# Patient Record
Sex: Male | Born: 1970
Health system: Southern US, Community
[De-identification: ages and names within clinical notes are randomized; demographics above are authoritative.]

## PROBLEM LIST (undated history)

## (undated) DIAGNOSIS — E785 Hyperlipidemia, unspecified: Secondary | ICD-10-CM

## (undated) DIAGNOSIS — I10 Essential (primary) hypertension: Secondary | ICD-10-CM

## (undated) HISTORY — DX: Essential (primary) hypertension: I10

## (undated) HISTORY — PX: HERNIA REPAIR: SHX51

## (undated) HISTORY — DX: Hyperlipidemia, unspecified: E78.5

---

## 2002-12-18 ENCOUNTER — Emergency Department (HOSPITAL_COMMUNITY): Admission: EM | Admit: 2002-12-18 | Discharge: 2002-12-18 | Payer: Self-pay | Admitting: Emergency Medicine

## 2005-03-18 ENCOUNTER — Encounter: Payer: Self-pay | Admitting: Cardiovascular Disease

## 2005-03-21 ENCOUNTER — Encounter: Payer: Self-pay | Admitting: Cardiovascular Disease

## 2010-08-26 ENCOUNTER — Emergency Department: Payer: BC Managed Care – PPO | Admitting: Emergency Medicine

## 2011-08-23 ENCOUNTER — Ambulatory Visit (INDEPENDENT_AMBULATORY_CARE_PROVIDER_SITE_OTHER): Payer: BC Managed Care – PPO | Admitting: Cardiovascular Disease

## 2011-08-23 ENCOUNTER — Encounter: Payer: Self-pay | Admitting: Cardiovascular Disease

## 2011-08-23 ENCOUNTER — Telehealth: Payer: Self-pay | Admitting: Cardiovascular Disease

## 2011-08-23 VITALS — BP 131/88 | HR 83 | Ht 67.0 in | Wt 147.0 lb

## 2011-08-23 DIAGNOSIS — R002 Palpitations: Secondary | ICD-10-CM | POA: Insufficient documentation

## 2011-08-23 NOTE — Telephone Encounter (Signed)
New Problem   Patient calling to verify med records sent from his PCP for his appnt today with Dr. Rose Fillers @ 11:30am.  He can be reached at work [phone]

## 2011-08-23 NOTE — Assessment & Plan Note (Signed)
Most likely are related to premature beats worsened with ingestion of stimulants such as nicotine and caffeine. He is advised to stop smoking and limit caffeine intake. No ischemic workup necessary at this time since he has no chest pain and normal stress test March 2012 per Dr. Jacinto Halim. Echo normal March 2012. He will follow up as needed. Smoking cessation encouraged.

## 2011-08-23 NOTE — Patient Instructions (Signed)
Your physician recommends that you schedule a follow-up appointment as needed  

## 2011-08-23 NOTE — Telephone Encounter (Signed)
Spoke with pt and told him we had received records from [facility]

## 2011-08-23 NOTE — Progress Notes (Signed)
   History of Present Illness: 41 yo AAM with history of tobacco abuse, chest pain and palpitations who is here today as a new patient for cardiac evaluation. He has been seen in the past by Dr. Dorothyann Peng in [place], [place]. He had a stress test in October 2006 which was normal. Echocardiogram 2006 with normal LVEF 60%, no significant valvular disease. EKG was normal at that time. He had recurrence of his symptoms over the last 3-4 years with awareness of palpitations. This is described as rapid heart rate with some fullness in chest. This lasts for about one minute. This occurs when driving and at work. Associated with dizziness but no SOB. He saw Dr. Yates Decamp 6 months ago for these symptoms. He had an echo which was normal, treadmill stress test which was normal. He wore a monitor for 48 hours and had no problems noted. 2 months ago he was drinking Red Bull energy drink and felt palpitations. He notices these mostly during stressful times.   He tells me today that he has had no palpitations over the last month. He is worried about the possibility of CAD since his mother had an MI. No chest pain or SOB with exertion. He smokes 5-10 cigarettes per day.    Past Medical History  Diagnosis Date  . Chest pain     Past Surgical History  Procedure Date  . Hernia repair     Current Outpatient Prescriptions  Medication Sig Dispense Refill  . NON FORMULARY NO MEDS AT THIS TIME        Allergies  Allergen Reactions  . Penicillins     History   Social History  . Marital Status: Married    Spouse Name: N/A    Number of Children: 0  . Years of Education: N/A   Occupational History  . IT    Social History Main Topics  . Smoking status: Current Everyday Smoker -- 0.5 packs/day for 17 years    Types: Cigarettes  . Smokeless tobacco: Not on file  . Alcohol Use: No  . Drug Use: No  . Sexually Active: Not on file   Other Topics Concern  . 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 He is a smoker. He denies alcohol consumption    Family History  Problem Relation Age of Onset  . Heart attack Mother     Review of Systems:  As stated in the HPI and otherwise negative.   BP 131/88  Pulse 83  Ht 5\' 7"  (1.702 m)  Wt 147 lb (66.679 kg)  BMI 23.02 kg/m2  Physical Examination: General: Well developed, well nourished, NAD HEENT: OP clear, mucus membranes moist SKIN: warm, dry. No rashes. Neuro: No focal deficits Musculoskeletal: Muscle strength 5/5 all ext Psychiatric: Mood and affect normal Neck: No JVD, no carotid bruits, no thyromegaly, no lymphadenopathy. Lungs:Clear bilaterally, no wheezes, rhonci, crackles Cardiovascular: Regular rate and rhythm. No murmurs, gallops or rubs. Abdomen:Soft. Bowel sounds present. Non-tender.  Extremities: No lower extremity edema. Pulses are 2 + in the bilateral DP/PT.  EKG: 07/20/11: NSR, normal EKG per Dr. Jacinto Halim

## 2011-10-03 ENCOUNTER — Other Ambulatory Visit: Payer: Self-pay | Admitting: Internal Medicine

## 2011-10-05 ENCOUNTER — Other Ambulatory Visit: Payer: Self-pay | Admitting: Physician Assistant

## 2011-10-13 ENCOUNTER — Ambulatory Visit: Payer: Self-pay | Admitting: Family Medicine

## 2011-12-11 ENCOUNTER — Emergency Department: Payer: Self-pay | Admitting: Emergency Medicine

## 2011-12-11 LAB — URINALYSIS, COMPLETE
Bacteria: NONE SEEN
Glucose,UR: NEGATIVE mg/dL (ref 0–75)
Ketone: NEGATIVE
Ph: 6 (ref 4.5–8.0)
Protein: NEGATIVE
Specific Gravity: 1.001 (ref 1.003–1.030)
Squamous Epithelial: NONE SEEN
WBC UR: <1 /HPF (ref 0–5)

## 2011-12-11 LAB — CBC
HGB: 13.9 g/dL (ref 13.0–18.0)
RBC: 4.99 10*6/uL (ref 4.40–5.90)
RDW: 13.3 % (ref 11.5–14.5)
WBC: 12.3 10*3/uL — ABNORMAL HIGH (ref 3.8–10.6)

## 2011-12-11 LAB — TROPONIN I: Troponin-I: <0.02 ng/mL

## 2011-12-11 LAB — COMPREHENSIVE METABOLIC PANEL
Alkaline Phosphatase: 57 U/L (ref 50–136)
BUN: 16 mg/dL (ref 7–18)
Bilirubin,Total: 0.3 mg/dL (ref 0.2–1.0)
Calcium, Total: 8.9 mg/dL (ref 8.5–10.1)
Chloride: 105 mmol/L (ref 98–107)
EGFR (African American): >60
Osmolality: 280 (ref 275–301)
Potassium: 4.2 mmol/L (ref 3.5–5.1)
Sodium: 140 mmol/L (ref 136–145)

## 2011-12-11 LAB — CK TOTAL AND CKMB (NOT AT ARMC): CK, Total: 269 U/L — ABNORMAL HIGH (ref 35–232)

## 2012-01-02 ENCOUNTER — Telehealth: Payer: Self-pay

## 2012-01-02 NOTE — Telephone Encounter (Signed)
Pt was seen approximately 1 year ago and needs urgent information for his insurance on his diagnosis of "Hyperlipedemia".  Explained that we allow 24 hours for return phone calls and that if this is indeed urgent, he may need to come in an be seen by a provider.

## 2012-01-03 NOTE — Telephone Encounter (Signed)
Called Pt and explained what hyperlipidemia is. Advised him to RTC fasting for lipid check. Eileen Stanford

## 2012-01-26 ENCOUNTER — Telehealth: Payer: Self-pay

## 2012-01-26 NOTE — Telephone Encounter (Signed)
Insurance brokerage requesting records on patient for insurance purposes   cb [phone]  Rodney Booze   Fax Number [phone]

## 2012-07-17 ENCOUNTER — Other Ambulatory Visit: Payer: Self-pay | Admitting: Family Medicine

## 2012-07-17 ENCOUNTER — Other Ambulatory Visit: Payer: Self-pay | Admitting: Internal Medicine

## 2014-09-08 ENCOUNTER — Ambulatory Visit: Payer: Self-pay | Admitting: Cardiovascular Disease

## 2015-04-10 ENCOUNTER — Ambulatory Visit (INDEPENDENT_AMBULATORY_CARE_PROVIDER_SITE_OTHER): Payer: Self-pay | Admitting: Family Medicine

## 2015-04-10 ENCOUNTER — Encounter: Payer: Self-pay | Admitting: Family Medicine

## 2015-04-10 VITALS — BP 128/84 | HR 85 | Temp 98.7°F | Ht 64.5 in | Wt 158.0 lb

## 2015-04-10 DIAGNOSIS — N529 Male erectile dysfunction, unspecified: Secondary | ICD-10-CM

## 2015-04-10 DIAGNOSIS — E785 Hyperlipidemia, unspecified: Secondary | ICD-10-CM

## 2015-04-10 DIAGNOSIS — I10 Essential (primary) hypertension: Secondary | ICD-10-CM | POA: Insufficient documentation

## 2015-04-10 LAB — LIPID PANEL PICCOLO, WAIVED
CHOL/HDL RATIO PICCOLO,WAIVE: 3.3 mg/dL
CHOLESTEROL PICCOLO, WAIVED: 206 mg/dL — AB (ref <200)
HDL Chol Piccolo, Waived: 62 mg/dL (ref >59)
LDL CHOL CALC PICCOLO WAIVED: 110 mg/dL — AB (ref <100)
TRIGLYCERIDES PICCOLO,WAIVED: 172 mg/dL — AB (ref <150)
VLDL Chol Calc Piccolo,Waive: 34 mg/dL — ABNORMAL HIGH (ref <30)

## 2015-04-10 MED ORDER — ATORVASTATIN CALCIUM 40 MG PO TABS: 40.0000 mg | ORAL_TABLET | Freq: Every day | ORAL | Status: DC

## 2015-04-10 MED ORDER — AMLODIPINE BESYLATE 10 MG PO TABS: 10.0000 mg | ORAL_TABLET | Freq: Every day | ORAL | Status: DC

## 2015-04-10 MED ORDER — LISINOPRIL 10 MG PO TABS: 10.0000 mg | ORAL_TABLET | Freq: Every day | ORAL | Status: DC

## 2015-04-10 MED ORDER — TADALAFIL 20 MG PO TABS
10.0000 mg | ORAL_TABLET | ORAL | Status: DC | PRN
Start: 2015-04-10 — End: 2015-04-27

## 2015-04-10 NOTE — Patient Instructions (Addendum)
Lisinopril tablets What is this medicine? LISINOPRIL (lyse IN oh pril) is an ACE inhibitor. This medicine is used to treat high blood pressure and heart failure. It is also used to protect the heart immediately after a heart attack. This medicine may be used for other purposes; ask your health care provider or pharmacist if you have questions. What should I tell my health care provider before I take this medicine? They need to know if you have any of these conditions: -diabetes -heart or blood vessel disease -kidney disease -low blood pressure -previous swelling of the tongue, face, or lips with difficulty breathing, difficulty swallowing, hoarseness, or tightening of the throat -an unusual or allergic reaction to lisinopril, other ACE inhibitors, insect venom, foods, dyes, or preservatives -pregnant or trying to get pregnant -breast-feeding How should I use this medicine? Take this medicine by mouth with a glass of water. Follow the directions on your prescription label. You may take this medicine with or without food. If it upsets your stomach, take it with food. Take your medicine at regular intervals. Do not take it more often than directed. Do not stop taking except on your doctor's advice. Talk to your pediatrician regarding the use of this medicine in children. Special care may be needed. While this drug may be prescribed for children as young as 6 years of age for selected conditions, precautions do apply. Overdosage: If you think you have taken too much of this medicine contact a poison control center or emergency room at once. NOTE: This medicine is only for you. Do not share this medicine with others. What if I miss a dose? If you miss a dose, take it as soon as you can. If it is almost time for your next dose, take only that dose. Do not take double or extra doses. What may interact with this medicine? Do not take this medicine with any of the following medications: -hymenoptera  venomThis medicines may also interact with the following medications: -aliskiren -angiotensin receptor blockers, like losartan or valsartan -certain medicines for diabetes -diuretics -everolimus -gold compounds -lithium -NSAIDs, medicines for pain and inflammation, like ibuprofen or naproxen -potassium salts or supplements -salt substitutes -sirolimus -temsirolimus This list may not describe all possible interactions. Give your health care provider a list of all the medicines, herbs, non-prescription drugs, or dietary supplements you use. Also tell them if you smoke, drink alcohol, or use illegal drugs. Some items may interact with your medicine. What should I watch for while using this medicine? Visit your doctor or health care professional for regular check ups. Check your blood pressure as directed. Ask your doctor what your blood pressure should be, and when you should contact him or her. Do not treat yourself for coughs, colds, or pain while you are using this medicine without asking your doctor or health care professional for advice. Some ingredients may increase your blood pressure. Women should inform their doctor if they wish to become pregnant or think they might be pregnant. There is a potential for serious side effects to an unborn child. Talk to your health care professional or pharmacist for more information. Check with your doctor or health care professional if you get an attack of severe diarrhea, nausea and vomiting, or if you sweat a lot. The loss of too much body fluid can make it dangerous for you to take this medicine. You may get drowsy or dizzy. Do not drive, use machinery, or do anything that needs mental alertness until you know how   this drug affects you. Do not stand or sit up quickly, especially if you are an older patient. This reduces the risk of dizzy or fainting spells. Alcohol can make you more drowsy and dizzy. Avoid alcoholic drinks. Avoid salt substitutes unless  you are told otherwise by your doctor or health care professional. What side effects may I notice from receiving this medicine? Side effects that you should report to your doctor or health care professional as soon as possible: -allergic reactions like skin rash, itching or hives, swelling of the hands, feet, face, lips, throat, or tongue -breathing problems -signs and symptoms of kidney injury like trouble passing urine or change in the amount of urine -signs and symptoms of increased potassium like muscle weakness; chest pain; or fast, irregular heartbeat -signs and symptoms of liver injury like dark yellow or brown urine; general ill feeling or flu-like symptoms; light-colored stools; loss of appetite; nausea; right upper belly pain; unusually weak or tired; yellowing of the eyes or skin -signs and symptoms of low blood pressure like dizziness; feeling faint or lightheaded, falls; unusually weak or tired -stomach pain with or without nausea and vomiting Side effects that usually do not require medical attention (report to your doctor or health care professional if they continue or are bothersome): -changes in taste -cough -dizziness -fever -headache -sensitivity to light This list may not describe all possible side effects. Call your doctor for medical advice about side effects. You may report side effects to FDA at 1-800-FDA-1088. Where should I keep my medicine? Keep out of the reach of children. Store at room temperature between 15 and 30 degrees C (59 and 86 degrees F). Protect from moisture. Keep container tightly closed. Throw away any unused medicine after the expiration date. NOTE: This sheet is a summary. It may not cover all possible information. If you have questions about this medicine, talk to your doctor, pharmacist, or health care provider.    2016, Elsevier/Gold Standard. (2015-02-05 20:38:20) Microalbumin Test WHY AM I HAVING THIS TEST? Albumin is a protein in your body  that helps regulate how much water is in your blood. As your kidneys filter your blood to get rid of waste products through your urine, albumin should remain in your bloodstream. However, certain types of kidney disease can cause albumin to move from damaged blood vessels inside your kidneys and into your urine. When this happens, small amounts of albumin (microalbumin, MA) can be detected in your urine. This type of kidney damage is a common complication of diabetes mellitus, especially when blood sugar (glucose) has not been well controlled. The MA test may also help your health care provider diagnose other related medical conditions such as cardiovascular disease and high blood pressure. The MA test is often performed along with calculating urine creatinine levels. Creatinine is another waste product filtered out of your blood by your kidneys. You may have this test if:  You have diabetes and are showing signs of kidney damage.  Your health care provider wants to determine how well your blood glucose has been controlled over many years.  Your health care provider wants to determine your kidney function when other related tests are normal. WHAT KIND OF SAMPLE IS TAKEN? A urine sample is collected in a sterile container given to you by the lab. WHAT ARE THE REFERENCE VALUES? Reference valuesare considered healthy valuesestablished after testing a large group of healthy people. Reference values may vary among different people, labs, and hospitals. It is your responsibility to obtain  your test results. Ask the lab or department performing the test when and how you will get your results. A reference value for MA is any value less than 2 mg/L. A reference value for MA compared to creatinine is:  Men: less than 17 mg/g creatinine.  Women: less than 25 mg/g creatinine. WHAT DO THE RESULTS MEAN? MA test results that are higher than the reference values may indicate many health conditions. These may  include:  Diabetes mellitus.  Poorly controlled diabetes mellitus.  Myoglobulinuria.  Hemoglobinuria.  Bence-Jones proteinuria.  Use of medicines or drugs that are damaging to the kidneys.  Atherosclerosis.  Blood fat (lipid) abnormalities.  Insulin resistance.  High blood pressure.  Heart attack. Talk with your health care provider to discuss your results, treatment options, and if necessary, the need for more tests. Talk with your health care provider if you have any questions about your results.   This information is not intended to replace advice given to you by your health care provider. Make sure you discuss any questions you have with your health care provider.   Document Released: 07/16/2004 Document Revised: 07/04/2014 Document Reviewed: 11/01/2013 Elsevier Interactive Patient Education [employer]2016 Elsevier Inc.

## 2015-04-10 NOTE — Assessment & Plan Note (Signed)
Under good control on current regimen. Continue current regimen. Continue to monitor.  

## 2015-04-10 NOTE — Assessment & Plan Note (Signed)
Well controlled with cialis. Refilled today. Continue to monitor. 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

## 2015-04-10 NOTE — Assessment & Plan Note (Signed)
Well controlled on current regimen. Unable to leave a urine for microalbumin today, but he is concerned about + microalbumin and would like to be switched to lisinopril. Risks and benefits discussed. Will change and recheck in 1 month.

## 2015-04-10 NOTE — Progress Notes (Signed)
BP 128/84 mmHg  Pulse 85  Temp(Src) 98.7 F (37.1 C)  Ht 5' 4.5" (1.638 m)  Wt 158 lb (71.668 kg)  BMI 26.71 kg/m2  SpO2 98%   Subjective:    Patient ID: Shawn Ware, male    DOB: 10/24/1970, 44 y.o.   MRN: [id number]  HPI: Shawn Ware is a 44 y.o. male who p  Chief Complaint  Patient presents with  . Hypertension  . Hyperlipidemia   HYPERTENSION / HYPERLIPIDEMIA Satisfied with current treatment? yes Duration of hypertension: 1 year BP monitoring frequency: a few times a day BP range: 130s-80s BP medication side effects: no Past BP meds: none Duration of hyperlipidemia: 6 months Cholesterol medication side effects: no Cholesterol supplements: none Past cholesterol medications: none Medication compliance: excellent compliance Aspirin: no Recent stressors: yes Recurrent headaches: no Visual changes: no Palpitations: no Dyspnea: no Chest pain: no Lower extremity edema: no Dizzy/lightheaded: yes  ERECTILE DYSFUNCTION ED status: stable on medicine Satisfied with current treatment?: yes Medication side effects: no Problem with getting erections: no Problem with sustaining erections: yes Poor erectile quality: yes Frequency of dysfunction: varying Morning/nocturnal erections: yes Decreased libido: no New medications: none Relationship problems: none Good communication with sexual partner: yes Satisfaction with current partner: yes  Treatments attempted: cialis  Relevant past medical, surgical, family and social history reviewed and updated as indicated. Interim medical history since our last visit reviewed. Allergies and medications reviewed and updated.  Review of Systems  Constitutional: Negative.   Respiratory: Negative.   Cardiovascular: Negative.   Gastrointestinal: Negative.   Psychiatric/Behavioral: Negative.     Per HPI unless specifically indicated above     Objective:    BP 128/84 mmHg  Pulse 85  Temp(Src) 98.7 F (37.1 C)  Ht 5'  4.5" (1.638 m)  Wt 158 lb (71.668 kg)  BMI 26.71 kg/m2  SpO2 98%  Wt Readings from Last 3 Encounters:  04/10/15 158 lb (71.668 kg)  08/23/11 147 lb (66.679 kg)    Physical Exam  Constitutional: He is oriented to person, place, and time. He appears well-developed and well-nourished. No distress.  HENT:  Head: Normocephalic and atraumatic.  Right Ear: Hearing normal.  Left Ear: Hearing normal.  Nose: Nose normal.  Eyes: Conjunctivae and lids are normal. Right eye exhibits no discharge. Left eye exhibits no discharge. No scleral icterus.  Cardiovascular: Normal rate, regular rhythm and intact distal pulses.  Exam reveals no gallop and no friction rub.   No murmur heard. Pulmonary/Chest: Effort normal and breath sounds normal. No respiratory distress. He has no wheezes. He has no rales. He exhibits no tenderness.  Musculoskeletal: Normal range of motion.  Neurological: He is alert and oriented to person, place, and time.  Skin: Skin is warm, dry and intact. No rash noted. No erythema. No pallor.  Psychiatric: He has a normal mood and affect. His speech is normal and behavior is normal. Judgment and thought content normal. Cognition and memory are normal.  Nursing note and vitals reviewed.   Results for orders placed or performed in visit on 12/11/11  Urinalysis, Complete  Result Value Ref Range   Color - urine Colorless    Clarity - urine Clear    Glucose,UR Negative 0-75 mg/dL   Bilirubin,UR Negative NEGATIVE   Ketone Negative NEGATIVE   Specific Gravity 1.001 1.003-1.030   Blood 1+ NEGATIVE   Ph 6.0 4.5-8.0   Protein Negative NEGATIVE   Nitrite Negative NEGATIVE   Leukocyte Esterase Negative NEGATIVE  RBC,UR 1 /HPF 0-5 /HPF   WBC UR <1 /HPF 0-5 /HPF   Bacteria NONE SEEN NONE SEEN   Squamous Epithelial NONE SEEN   CBC  Result Value Ref Range   WBC 12.3 (H) 3.8-10.6 x10 3/mm 3   RBC 4.99 4.40-5.90 x10 6/mm 3   HGB 13.9 13.0-18.0 g/dL   HCT 42.4 40.0-52.0 %   MCV 85  80-100 fL   MCH 28.0 26.0-34.0 pg   MCHC 32.9 32.0-36.0 g/dL   RDW 13.3 11.5-14.5 %   Platelet 196 150-440 x10 3/mm 3  Comprehensive metabolic panel  Result Value Ref Range   Glucose 92 65-99 mg/dL   BUN 16 7-18 mg/dL   Creatinine 1.01 0.60-1.30 mg/dL   Sodium 140 136-145 mmol/L   Potassium 4.2 3.5-5.1 mmol/L   Chloride 105 98-107 mmol/L   Co2 28 21-32 mmol/L   Calcium, Total 8.9 8.5-10.1 mg/dL   SGOT(AST) 15 15-37 Unit/L   SGPT (ALT) 19 U/L   Alkaline Phosphatase 57 50-136 Unit/L   Albumin 3.9 3.4-5.0 g/dL   Total Protein 7.4 6.4-8.2 g/dL   Bilirubin,Total 0.3 0.2-1.0 mg/dL   Osmolality 280 275-301   Anion Gap 7 7-16   EGFR (African American) >60    EGFR (Non-African Amer.) >60   Troponin I  Result Value Ref Range   Troponin-I < 0.02 ng/mL  CK total and CKMB (cardiac)  Result Value Ref Range   CK, Total 269 (H) 35-232 Unit/L   CK-MB 1.0 0.5-3.6 ng/mL      Assessment & Plan:   Problem List Items Addressed This Visit      Cardiovascular and Mediastinum   Hypertension - Primary    Well controlled on current regimen. Unable to leave a urine for microalbumin today, but he is concerned about + microalbumin and would like to be switched to lisinopril. Risks and benefits discussed. Will change and recheck in 1 month.       Relevant Medications   atorvastatin (LIPITOR) 40 MG tablet   lisinopril (PRINIVIL,ZESTRIL) 10 MG tablet   tadalafil (CIALIS) 20 MG tablet   Other Relevant Orders   Comprehensive metabolic panel   Lipid Panel Piccolo, Waived     Genitourinary   ED (erectile dysfunction)    Well controlled with cialis. Refilled today. Continue to monitor. 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         Other   Hyperlipidemia    Under good control on current regimen. Continue current regimen. Continue to monitor.       Relevant Medications   atorvastatin (LIPITOR) 40 MG tablet   lisinopril (PRINIVIL,ZESTRIL) 10 MG tablet   tadalafil (CIALIS) 20  MG tablet    Other Visit Diagnoses    HLD (hyperlipidemia)        Relevant Medications    atorvastatin (LIPITOR) 40 MG tablet    lisinopril (PRINIVIL,ZESTRIL) 10 MG tablet    tadalafil (CIALIS) 20 MG tablet    Other Relevant Orders    Lipid Panel Piccolo, Waived        Follow up plan: Return in about 4 weeks (around 05/08/2015) for BP recheck.

## 2015-04-11 LAB — COMPREHENSIVE METABOLIC PANEL
ALT: 27 IU/L (ref 0–44)
AST: 24 IU/L (ref 0–40)
Albumin/Globulin Ratio: 1.9 (ref 1.1–2.5)
Albumin: 4.6 g/dL (ref 3.5–5.5)
Alkaline Phosphatase: 56 IU/L (ref 39–117)
BILIRUBIN TOTAL: 0.4 mg/dL (ref 0.0–1.2)
BUN/Creatinine Ratio: 16 (ref 9–20)
BUN: 14 mg/dL (ref 6–24)
CALCIUM: 9.9 mg/dL (ref 8.7–10.2)
CHLORIDE: 99 mmol/L (ref 97–108)
CO2: 24 mmol/L (ref 18–29)
Creatinine, Ser: 0.88 mg/dL (ref 0.76–1.27)
GFR calc non Af Amer: 106 mL/min/{1.73_m2} (ref >59)
GFR, EST AFRICAN AMERICAN: 122 mL/min/{1.73_m2} (ref >59)
GLUCOSE: 91 mg/dL (ref 65–99)
Globulin, Total: 2.4 g/dL (ref 1.5–4.5)
Potassium: 4.1 mmol/L (ref 3.5–5.2)
Sodium: 141 mmol/L (ref 134–144)
Total Protein: 7 g/dL (ref 6.0–8.5)

## 2015-04-13 ENCOUNTER — Encounter: Payer: Self-pay | Admitting: Family Medicine

## 2015-04-27 ENCOUNTER — Telehealth: Payer: Self-pay | Admitting: Family Medicine

## 2015-04-27 MED ORDER — TADALAFIL 20 MG PO TABS: 10.0000 mg | ORAL_TABLET | ORAL | Status: DC | PRN

## 2015-04-27 NOTE — Telephone Encounter (Signed)
Rx sent to her pharmacy 

## 2015-04-27 NOTE — Telephone Encounter (Signed)
Forward to provider

## 2015-04-27 NOTE — Telephone Encounter (Signed)
Pt would like to have rx rewrote for cialis for 6 tablets due to cost. He would like it to go to [facility]

## 2015-09-28 ENCOUNTER — Ambulatory Visit: Payer: Self-pay | Admitting: Family Medicine

## 2015-09-29 ENCOUNTER — Encounter: Payer: Self-pay | Admitting: Family Medicine

## 2015-09-29 ENCOUNTER — Ambulatory Visit (INDEPENDENT_AMBULATORY_CARE_PROVIDER_SITE_OTHER): Payer: Managed Care, Other (non HMO) | Admitting: Family Medicine

## 2015-09-29 VITALS — BP 144/84 | HR 75 | Temp 98.4°F | Ht 65.1 in | Wt 160.0 lb

## 2015-09-29 DIAGNOSIS — M25512 Pain in left shoulder: Secondary | ICD-10-CM | POA: Diagnosis not present

## 2015-09-29 DIAGNOSIS — I10 Essential (primary) hypertension: Secondary | ICD-10-CM | POA: Diagnosis not present

## 2015-09-29 DIAGNOSIS — R0981 Nasal congestion: Secondary | ICD-10-CM

## 2015-09-29 DIAGNOSIS — E785 Hyperlipidemia, unspecified: Secondary | ICD-10-CM | POA: Diagnosis not present

## 2015-09-29 LAB — MICROALBUMIN, URINE WAIVED
CREATININE, URINE WAIVED: 200 mg/dL (ref 10–300)
MICROALB, UR WAIVED: 80 mg/L — AB (ref 0–19)

## 2015-09-29 MED ORDER — LOSARTAN POTASSIUM 25 MG PO TABS: 12.5000 mg | ORAL_TABLET | Freq: Every day | ORAL | Status: DC

## 2015-09-29 MED ORDER — NAPROXEN 500 MG PO TABS: 500.0000 mg | ORAL_TABLET | Freq: Two times a day (BID) | ORAL | Status: DC

## 2015-09-29 MED ORDER — ATORVASTATIN CALCIUM 40 MG PO TABS: 40.0000 mg | ORAL_TABLET | Freq: Every day | ORAL | Status: DC

## 2015-09-29 MED ORDER — PREDNISONE 50 MG PO TABS: 50.0000 mg | ORAL_TABLET | Freq: Every day | ORAL | Status: DC

## 2015-09-29 NOTE — Assessment & Plan Note (Signed)
Not under good control. Not taking medicine. Will change to losartan and recheck in 1 month.

## 2015-09-29 NOTE — Assessment & Plan Note (Signed)
Checking labs today. Continue current regimen. Continue to monitor.  

## 2015-09-29 NOTE — Progress Notes (Signed)
BP 144/84 mmHg  Pulse 75  Temp(Src) 98.4 F (36.9 C)  Ht 5' 5.1" (1.654 m)  Wt 160 lb (72.576 kg)  BMI 26.53 kg/m2  SpO2 99%   Subjective:    Patient ID: Shawn Ware, male    DOB: 07-27-1970, 45 y.o.   MRN: [id number]  HPI: Shawn Ware is a 45 y.o. male  Chief Complaint  Patient presents with  . Hypertension  . Hyperlipidemia   HYPERTENSION / HYPERLIPIDEMIA- has not been taking his medicine off and on for the past month or so, did not take his medicine today Satisfied with current treatment? yes Duration of hypertension: chronic BP monitoring frequency: a few times a month BP range: 130s/80s, off his medicine BP medication side effects: no Past BP meds: lisinopril Duration of hyperlipidemia: chronic Cholesterol medication side effects: no Cholesterol supplements: none Past cholesterol medications: atorvastatin Medication compliance: fair compliance Aspirin: no Recent stressors: yes Recurrent headaches: no Visual changes: no Palpitations: no Dyspnea: no Chest pain: no Lower extremity edema: no Dizzy/lightheaded: no  UPPER RESPIRATORY TRACT INFECTION Duration: 1 week Worst symptom: drainage, tonsil stones Fever: no Cough: no Shortness of breath: no Wheezing: no Chest pain: no Chest tightness: no Chest congestion: yes Nasal congestion: yes Runny nose: yes Post nasal drip: yes Sneezing: yes Sore throat: no Swollen glands: no Sinus pressure: no Headache: yes Face pain: no Toothache: yes Ear pain: yes left Ear pressure: no  Eyes red/itching:no Eye drainage/crusting: no  Vomiting: no Rash: no Fatigue: yes Sick contacts: yes Strep contacts: no  Context: stable Recurrent sinusitis: no Relief with OTC cold/cough medications: no  Treatments attempted: mucinex, cold and sinus, nasal spray   SHOULDER PAIN Duration: 6 months Involved shoulder: left Mechanism of injury: working out Location: anterior, deep inside Onset:sudden Severity: moderate   Quality:  throbbing Frequency: intermittent Radiation: no Aggravating factors: working out, Surveyor, minerals on it   Alleviating factors: NSAIDs  Status: fluctuating Treatments attempted: ibuprofen  Relief with NSAIDs?:  significant Weakness: yes Numbness: no Decreased grip strength: no Redness: no Swelling: no Bruising: no Fevers: no  Relevant past medical, surgical, family and social history reviewed and updated as indicated. Interim medical history since our last visit reviewed. Allergies and medications reviewed and updated.  Review of Systems  Constitutional: Negative.   HENT: Positive for congestion, ear pain, postnasal drip, rhinorrhea, sneezing and sore throat. Negative for dental problem, drooling, ear discharge, facial swelling, hearing loss, mouth sores, nosebleeds, sinus pressure, tinnitus, trouble swallowing and voice change.   Respiratory: Negative.   Cardiovascular: Negative.   Musculoskeletal: Positive for myalgias and arthralgias. Negative for back pain, joint swelling, gait problem, neck pain and neck stiffness.  Skin: Negative.   Psychiatric/Behavioral: Negative.     Per HPI unless specifically indicated above     Objective:    BP 144/84 mmHg  Pulse 75  Temp(Src) 98.4 F (36.9 C)  Ht 5' 5.1" (1.654 m)  Wt 160 lb (72.576 kg)  BMI 26.53 kg/m2  SpO2 99%  Wt Readings from Last 3 Encounters:  09/29/15 160 lb (72.576 kg)  04/10/15 158 lb (71.668 kg)  08/23/11 147 lb (66.679 kg)    Physical Exam  Constitutional: He is oriented to person, place, and time. He appears well-developed and well-nourished. No distress.  HENT:  Head: Normocephalic and atraumatic.  Right Ear: Hearing, tympanic membrane, external ear and ear canal normal.  Left Ear: Hearing, tympanic membrane, external ear and ear canal normal.  Nose: Mucosal edema and rhinorrhea present. Right  sinus exhibits no maxillary sinus tenderness and no frontal sinus tenderness. Left sinus exhibits no maxillary  sinus tenderness and no frontal sinus tenderness.  Mouth/Throat: Uvula is midline, oropharynx is clear and moist and mucous membranes are normal. No oropharyngeal exudate.  Eyes: Conjunctivae, EOM and lids are normal. Pupils are equal, round, and reactive to light. Right eye exhibits no discharge. Left eye exhibits no discharge. No scleral icterus.  Neck: Normal range of motion. Neck supple. No JVD present. No tracheal deviation present. No thyromegaly present.  Cardiovascular: Normal rate, regular rhythm, normal heart sounds and intact distal pulses.  Exam reveals no gallop and no friction rub.   No murmur heard. Pulmonary/Chest: Effort normal and breath sounds normal. No stridor. No respiratory distress. He has no wheezes. He has no rales. He exhibits no tenderness.  Lymphadenopathy:    He has no cervical adenopathy.  Neurological: He is alert and oriented to person, place, and time.  Skin: Skin is warm, dry and intact. No rash noted. He is not diaphoretic. No erythema. No pallor.  Psychiatric: He has a normal mood and affect. His speech is normal and behavior is normal. Judgment and thought content normal. Cognition and memory are normal.  Nursing note and vitals reviewed.     Shoulder: left    Inspection:  no swelling, ecchymosis, erythema or step off deformity.    Tenderness to Palpation:    Acromion: no    AC joint:no    Clavicle: no    Bicipital groove: no    Scapular spine: no    Coracoid process: no    Humeral head: no    Supraspinatus tendon: no     Range of Motion:  Full Range of Motion bilaterally    Painful arc: yes     Muscle Strength: 5/5 bilaterally    Neuro: Sensation WNL. and Upper extremity reflexes WNL.     Special Tests:     Neer sign: Negative    Hawkins sign: Negative    Cross arm adduction: Negative    Yergason sign: Negative    O'brien sign: Negative     Speed sign: Negative  Results for orders placed or performed in visit on 09/29/15  Microalbumin,  Urine Waived  Result Value Ref Range   Microalb, Ur Waived 80 (H) 0 - 19 mg/L   Creatinine, Urine Waived 200 10 - 300 mg/dL   Microalb/Creat Ratio <30 <30 mg/g      Assessment & Plan:   Problem List Items Addressed This Visit      Cardiovascular and Mediastinum   Hypertension - Primary    Not under good control. Not taking medicine. Will change to losartan and recheck in 1 month.       Relevant Medications   losartan (COZAAR) 25 MG tablet   atorvastatin (LIPITOR) 40 MG tablet   Other Relevant Orders   Comprehensive metabolic panel   Microalbumin, Urine Waived (Completed)     Other   Hyperlipidemia    Checking labs today. Continue current regimen. Continue to monitor.       Relevant Medications   losartan (COZAAR) 25 MG tablet   atorvastatin (LIPITOR) 40 MG tablet   Other Relevant Orders   Comprehensive metabolic panel   Lipid Panel w/o Chol/HDL Ratio    Other Visit Diagnoses    Nasal congestion        Will treat with prednisone burst. No sign of infection. Call if not getting better or getting worse.  Left shoulder pain        Likely tendonitis. Will treat with naproxen, not to take with prednisone. Exercises given. Recheck 3-4 weeks. Call if not better. If no better, consider PT.         Follow up plan: Return 3-4 weeks, for recheck shoulder/BP.

## 2015-09-30 ENCOUNTER — Encounter: Payer: Self-pay | Admitting: Family Medicine

## 2015-09-30 LAB — LIPID PANEL W/O CHOL/HDL RATIO
Cholesterol, Total: 162 mg/dL (ref 100–199)
HDL: 45 mg/dL (ref >39)
LDL CALC: 95 mg/dL (ref 0–99)
TRIGLYCERIDES: 109 mg/dL (ref 0–149)
VLDL CHOLESTEROL CAL: 22 mg/dL (ref 5–40)

## 2015-09-30 LAB — COMPREHENSIVE METABOLIC PANEL
ALT: 17 IU/L (ref 0–44)
AST: 22 IU/L (ref 0–40)
Albumin/Globulin Ratio: 2 (ref 1.2–2.2)
Albumin: 4.7 g/dL (ref 3.5–5.5)
Alkaline Phosphatase: 56 IU/L (ref 39–117)
BUN/Creatinine Ratio: 15 (ref 9–20)
BUN: 14 mg/dL (ref 6–24)
Bilirubin Total: 0.3 mg/dL (ref 0.0–1.2)
CALCIUM: 9.3 mg/dL (ref 8.7–10.2)
CO2: 24 mmol/L (ref 18–29)
CREATININE: 0.94 mg/dL (ref 0.76–1.27)
Chloride: 101 mmol/L (ref 96–106)
GFR, EST AFRICAN AMERICAN: 114 mL/min/{1.73_m2} (ref >59)
GFR, EST NON AFRICAN AMERICAN: 99 mL/min/{1.73_m2} (ref >59)
Globulin, Total: 2.3 g/dL (ref 1.5–4.5)
Glucose: 98 mg/dL (ref 65–99)
Potassium: 4.2 mmol/L (ref 3.5–5.2)
Sodium: 141 mmol/L (ref 134–144)
TOTAL PROTEIN: 7 g/dL (ref 6.0–8.5)

## 2015-10-15 ENCOUNTER — Telehealth: Payer: Self-pay | Admitting: Family Medicine

## 2015-10-15 MED ORDER — AZITHROMYCIN 250 MG PO TABS: ORAL_TABLET | ORAL | Status: DC

## 2015-10-15 NOTE — Telephone Encounter (Signed)
Rx sent to her pharmacy 

## 2015-10-15 NOTE — Telephone Encounter (Signed)
Pt called stated he is still not feeling 100% wants to know if a Z pack can be called in for him. Pharm is [facility] on [address] [place]. Thanks.

## 2015-10-15 NOTE — Telephone Encounter (Signed)
Forward to provider

## 2015-10-31 ENCOUNTER — Other Ambulatory Visit: Payer: Self-pay | Admitting: Family Medicine

## 2016-05-09 ENCOUNTER — Other Ambulatory Visit: Payer: Self-pay | Admitting: Family Medicine

## 2016-05-23 ENCOUNTER — Ambulatory Visit (INDEPENDENT_AMBULATORY_CARE_PROVIDER_SITE_OTHER): Payer: BLUE CROSS/BLUE SHIELD | Admitting: Family Medicine

## 2016-05-23 ENCOUNTER — Encounter: Payer: Self-pay | Admitting: Family Medicine

## 2016-05-23 ENCOUNTER — Other Ambulatory Visit: Payer: Self-pay | Admitting: Family Medicine

## 2016-05-23 VITALS — BP 116/72 | HR 81 | Temp 99.2°F | Ht 66.0 in | Wt 155.3 lb

## 2016-05-23 DIAGNOSIS — M79602 Pain in left arm: Secondary | ICD-10-CM | POA: Diagnosis not present

## 2016-05-23 DIAGNOSIS — E782 Mixed hyperlipidemia: Secondary | ICD-10-CM

## 2016-05-23 DIAGNOSIS — I10 Essential (primary) hypertension: Secondary | ICD-10-CM | POA: Diagnosis not present

## 2016-05-23 LAB — LIPID PANEL PICCOLO, WAIVED
CHOL/HDL RATIO PICCOLO,WAIVE: 2.4 mg/dL
CHOLESTEROL PICCOLO, WAIVED: 140 mg/dL (ref <200)
HDL CHOL PICCOLO, WAIVED: 58 mg/dL (ref >59)
LDL CHOL CALC PICCOLO WAIVED: 54 mg/dL (ref <100)
Triglycerides Piccolo,Waived: 140 mg/dL (ref <150)
VLDL Chol Calc Piccolo,Waive: 28 mg/dL (ref <30)

## 2016-05-23 NOTE — Progress Notes (Signed)
BP 116/72   Pulse 81   Temp 99.2 F (37.3 C)   Ht 5\' 6"  (1.676 m)   Wt 155 lb 4.8 oz (70.4 kg)   SpO2 99%   BMI 25.07 kg/m    Subjective:    Patient ID: Shawn Ware, male    DOB: 08/12/1970, 45 y.o.   MRN: [id number]  HPI: Shawn DustmanDarius C Reichard is a 45 y.o. male  Chief Complaint  Patient presents with  . Hypertension  . Hyperlipidemia   HYPERTENSION / HYPERLIPIDEMIA Satisfied with current treatment? yes Duration of hypertension: chronic BP monitoring frequency: yes- 130/80 BP medication side effects: no Past BP meds: losartan and amlodipine Duration of hyperlipidemia: chronic Cholesterol medication side effects: no Cholesterol supplements: fish oil Past cholesterol medications: atorvastain (lipitor) Medication compliance: fair compliance Aspirin: no Recent stressors: no Recurrent headaches: no Visual changes: no Palpitations: no Dyspnea: no Chest pain: no Lower extremity edema: no Dizzy/lightheaded: no  ARM PAIN Duration: chronic Location: left Mechanism of injury: unknown Onset: sudden Severity: 4/10  Quality:  aching, burning Frequency: almost constant, waxing and waning Radiation: no Aggravating factors: exercise, smoking   Alleviating factors: NSAIDs  Status: stable Treatments attempted: ibuprofen  Relief with NSAIDs?:  moderate Swelling: no Redness: no  Warmth: no Trauma: no Chest pain: no  Shortness of breath: no  Fever: no Decreased sensation: no Paresthesias: no Weakness: no  Relevant past medical, surgical, family and social history reviewed and updated as indicated. Interim medical history since our last visit reviewed. Allergies and medications reviewed and updated.  Review of Systems  Constitutional: Negative.   Respiratory: Negative.  Negative for chest tightness.   Cardiovascular: Negative.   Musculoskeletal: Positive for myalgias.  Psychiatric/Behavioral: Negative.     Per HPI unless specifically indicated above       Objective:    BP 116/72   Pulse 81   Temp 99.2 F (37.3 C)   Ht 5\' 6"  (1.676 m)   Wt 155 lb 4.8 oz (70.4 kg)   SpO2 99%   BMI 25.07 kg/m   Wt Readings from Last 3 Encounters:  05/23/16 155 lb 4.8 oz (70.4 kg)  09/29/15 160 lb (72.6 kg)  04/10/15 158 lb (71.7 kg)    Physical Exam  Constitutional: He is oriented to person, place, and time. He appears well-developed and well-nourished. No distress.  HENT:  Head: Normocephalic and atraumatic.  Right Ear: Hearing and external ear normal.  Left Ear: Hearing and external ear normal.  Nose: Nose normal.  Mouth/Throat: Oropharynx is clear and moist.  Eyes: Conjunctivae and lids are normal. Right eye exhibits no discharge. Left eye exhibits no discharge. No scleral icterus.  Cardiovascular: Normal rate, regular rhythm, normal heart sounds and intact distal pulses.  Exam reveals no gallop and no friction rub.   No murmur heard. Pulmonary/Chest: Effort normal and breath sounds normal. No respiratory distress. He has no wheezes. He has no rales. He exhibits no tenderness.  Musculoskeletal: Normal range of motion.  Neurological: He is alert and oriented to person, place, and time.  Skin: Skin is warm, dry and intact. No rash noted. He is not diaphoretic. No erythema. No pallor.  Psychiatric: He has a normal mood and affect. His speech is normal and behavior is normal. Judgment and thought content normal. Cognition and memory are normal.  Nursing note and vitals reviewed.    Shoulder: left    Inspection:  no swelling, ecchymosis, erythema or step off deformity.    Tenderness to Palpation:  Acromion: no    AC joint:no    Clavicle: no    Bicipital groove: yes    Scapular spine: no    Coracoid process: no    Humeral head: no    Supraspinatus tendon: no     Range of Motion:  Full Range of Motion bilaterally     Muscle Strength: 5/5 bilaterally     Neuro: Sensation WNL. and Upper extremity reflexes WNL.  Results for orders placed  or performed in visit on 09/29/15  Comprehensive metabolic panel  Result Value Ref Range   Glucose 98 65 - 99 mg/dL   BUN 14 6 - 24 mg/dL   Creatinine, Ser 5.400.94 0.76 - 1.27 mg/dL   GFR calc non Af Amer 99 >59 mL/min/1.73   GFR calc Af Amer 114 >59 mL/min/1.73   BUN/Creatinine Ratio 15 9 - 20   Sodium 141 134 - 144 mmol/L   Potassium 4.2 3.5 - 5.2 mmol/L   Chloride 101 96 - 106 mmol/L   CO2 24 18 - 29 mmol/L   Calcium 9.3 8.7 - 10.2 mg/dL   Total Protein 7.0 6.0 - 8.5 g/dL   Albumin 4.7 3.5 - 5.5 g/dL   Globulin, Total 2.3 1.5 - 4.5 g/dL   Albumin/Globulin Ratio 2.0 1.2 - 2.2   Bilirubin Total 0.3 0.0 - 1.2 mg/dL   Alkaline Phosphatase 56 39 - 117 IU/L   AST 22 0 - 40 IU/L   ALT 17 0 - 44 IU/L  Lipid Panel w/o Chol/HDL Ratio  Result Value Ref Range   Cholesterol, Total 162 100 - 199 mg/dL   Triglycerides 981109 0 - 149 mg/dL   HDL 45 >19>39 mg/dL   VLDL Cholesterol Cal 22 5 - 40 mg/dL   LDL Calculated 95 0 - 99 mg/dL  Microalbumin, Urine Waived  Result Value Ref Range   Microalb, Ur Waived 80 (H) 0 - 19 mg/L   Creatinine, Urine Waived 200 10 - 300 mg/dL   Microalb/Creat Ratio <30 <30 mg/g      Assessment & Plan:   Problem List Items Addressed This Visit      Cardiovascular and Mediastinum   Hypertension - Primary    Under good control on recheck. Continue current regimen. Continue to monitor.         Other   Hyperlipidemia    Under good control on current regimen. Continue current regimen. Continue to monitor. Call with any problems.        Other Visit Diagnoses    Left arm pain       EKG normal today. Offered referral to cardiology for stress test, he will call if he wants to do it. Conitnue to monitor closely, call with any concerns.    Relevant Orders   EKG 12-Lead (Completed)       Follow up plan: Return in about 6 months (around 11/20/2016) for Physical/Follow up BP/Chol.

## 2016-05-23 NOTE — Assessment & Plan Note (Signed)
Under good control on recheck. Continue current regimen. Continue to monitor.  

## 2016-05-23 NOTE — Assessment & Plan Note (Signed)
Under good control on current regimen. Continue current regimen. Continue to monitor. Call with any problems.

## 2016-05-24 ENCOUNTER — Encounter: Payer: Self-pay | Admitting: Family Medicine

## 2016-05-24 LAB — COMPREHENSIVE METABOLIC PANEL
A/G RATIO: 2.2 (ref 1.2–2.2)
ALBUMIN: 4.7 g/dL (ref 3.5–5.5)
ALT: 26 IU/L (ref 0–44)
AST: 23 IU/L (ref 0–40)
Alkaline Phosphatase: 65 IU/L (ref 39–117)
BUN / CREAT RATIO: 18 (ref 9–20)
BUN: 15 mg/dL (ref 6–24)
Bilirubin Total: 0.3 mg/dL (ref 0.0–1.2)
CALCIUM: 9 mg/dL (ref 8.7–10.2)
CO2: 22 mmol/L (ref 18–29)
Chloride: 105 mmol/L (ref 96–106)
Creatinine, Ser: 0.84 mg/dL (ref 0.76–1.27)
GFR, EST AFRICAN AMERICAN: 124 mL/min/{1.73_m2} (ref >59)
GFR, EST NON AFRICAN AMERICAN: 107 mL/min/{1.73_m2} (ref >59)
GLOBULIN, TOTAL: 2.1 g/dL (ref 1.5–4.5)
Glucose: 127 mg/dL — ABNORMAL HIGH (ref 65–99)
POTASSIUM: 3.9 mmol/L (ref 3.5–5.2)
SODIUM: 142 mmol/L (ref 134–144)
TOTAL PROTEIN: 6.8 g/dL (ref 6.0–8.5)

## 2016-06-14 ENCOUNTER — Other Ambulatory Visit: Payer: Self-pay | Admitting: Family Medicine

## 2016-10-27 DIAGNOSIS — S335XXA Sprain of ligaments of lumbar spine, initial encounter: Secondary | ICD-10-CM | POA: Diagnosis not present

## 2016-11-08 ENCOUNTER — Ambulatory Visit: Payer: BLUE CROSS/BLUE SHIELD | Admitting: Family Medicine

## 2016-11-09 ENCOUNTER — Encounter: Payer: Self-pay | Admitting: Family Medicine

## 2016-11-09 ENCOUNTER — Ambulatory Visit (INDEPENDENT_AMBULATORY_CARE_PROVIDER_SITE_OTHER): Payer: BLUE CROSS/BLUE SHIELD | Admitting: Family Medicine

## 2016-11-09 VITALS — BP 131/87 | HR 86 | Ht 67.0 in | Wt 151.0 lb

## 2016-11-09 DIAGNOSIS — I10 Essential (primary) hypertension: Secondary | ICD-10-CM | POA: Diagnosis not present

## 2016-11-09 DIAGNOSIS — E782 Mixed hyperlipidemia: Secondary | ICD-10-CM | POA: Diagnosis not present

## 2016-11-09 DIAGNOSIS — M5442 Lumbago with sciatica, left side: Secondary | ICD-10-CM

## 2016-11-09 DIAGNOSIS — M5441 Lumbago with sciatica, right side: Secondary | ICD-10-CM | POA: Diagnosis not present

## 2016-11-09 MED ORDER — LOSARTAN POTASSIUM 25 MG PO TABS: 12.5000 mg | ORAL_TABLET | Freq: Every day | ORAL | 6 refills | Status: DC

## 2016-11-09 MED ORDER — ATORVASTATIN CALCIUM 40 MG PO TABS: 40.0000 mg | ORAL_TABLET | Freq: Every day | ORAL | 6 refills | Status: DC

## 2016-11-09 MED ORDER — IBUPROFEN 800 MG PO TABS: 800.0000 mg | ORAL_TABLET | Freq: Three times a day (TID) | ORAL | 0 refills | Status: DC | PRN

## 2016-11-09 MED ORDER — CYCLOBENZAPRINE HCL 10 MG PO TABS: 10.0000 mg | ORAL_TABLET | Freq: Every day | ORAL | 0 refills | Status: DC

## 2016-11-09 MED ORDER — AMLODIPINE BESYLATE 10 MG PO TABS: ORAL_TABLET | ORAL | 7 refills | Status: DC

## 2016-11-09 NOTE — Patient Instructions (Addendum)

## 2016-11-09 NOTE — Assessment & Plan Note (Signed)
Under good control. Continue current regimen. Continue to monitor. Call with any concerns. 

## 2016-11-09 NOTE — Progress Notes (Signed)
BP 131/87   Pulse 86   Ht 5\' 7"  (1.702 m)   Wt 151 lb (68.5 kg)   SpO2 99%   BMI 23.65 kg/m    Subjective:    Patient ID: Shawn Ware, male    DOB: May 10, 1971, 46 y.o.   MRN: [id number]  HPI: Shawn Ware is a 46 y.o. male  Chief Complaint  Patient presents with  . Back Pain    Lower back. Injuried x 2 weeks ago. Larey Seat on it coming down stairs, then also was lifting stuff later   BACK PAIN- fell down the stairs on his buttocks then later was lifting stuff and thinks that that's when he hurt it.  Duration: 2 weeks Mechanism of injury: lifting and fall Location: R>L and low back Onset: sudden Severity: moderate Quality: sharp and burning Frequency: intermittent Radiation: R leg above the knee and L leg above the knee Aggravating factors: sitting or standing long periods Alleviating factors: laying down and walking Status: stable Treatments attempted: rest, ice, heat and ibuprofen  Relief with NSAIDs?: mild Nighttime pain:  yes Paresthesias / decreased sensation:  yes Bowel / bladder incontinence:  no Fevers:  no Dysuria / urinary frequency:  no  HYPERTENSION / HYPERLIPIDEMIA Satisfied with current treatment? yes Duration of hypertension: chronic BP monitoring frequency: not checking BP medication side effects: no Past BP meds: amlodipine and losartan Duration of hyperlipidemia: chronic Cholesterol medication side effects: no Cholesterol supplements: none Past cholesterol medications: atorvastatin Medication compliance: excellent compliance Aspirin: no Recent stressors: no Recurrent headaches: no Visual changes: no Palpitations: no Dyspnea: no Chest pain: no Lower extremity edema: no Dizzy/lightheaded: no  Relevant past medical, surgical, family and social history reviewed and updated as indicated. Interim medical history since our last visit reviewed. Allergies and medications reviewed and updated.  Review of Systems  Constitutional:  Negative.   Respiratory: Negative.   Cardiovascular: Negative.   Musculoskeletal: Positive for back pain and myalgias. Negative for arthralgias, gait problem, joint swelling, neck pain and neck stiffness.  Neurological: Negative.   Psychiatric/Behavioral: Negative.     Per HPI unless specifically indicated above     Objective:    BP 131/87   Pulse 86   Ht 5\' 7"  (1.702 m)   Wt 151 lb (68.5 kg)   SpO2 99%   BMI 23.65 kg/m   Wt Readings from Last 3 Encounters:  11/09/16 151 lb (68.5 kg)  05/23/16 155 lb 4.8 oz (70.4 kg)  09/29/15 160 lb (72.6 kg)    Physical Exam  Constitutional: He is oriented to person, place, and time. He appears well-developed and well-nourished. No distress.  HENT:  Head: Normocephalic and atraumatic.  Right Ear: Hearing normal.  Left Ear: Hearing normal.  Nose: Nose normal.  Eyes: Conjunctivae and lids are normal. Right eye exhibits no discharge. Left eye exhibits no discharge. No scleral icterus.  Cardiovascular: Normal rate, regular rhythm, normal heart sounds and intact distal pulses.  Exam reveals no gallop and no friction rub.   No murmur heard. Pulmonary/Chest: Effort normal and breath sounds normal. No respiratory distress. He has no wheezes. He has no rales. He exhibits no tenderness.  Neurological: He is alert and oriented to person, place, and time.  Skin: Skin is warm, dry and intact. No rash noted. No erythema. No pallor.  Psychiatric: He has a normal mood and affect. His speech is normal and behavior is normal. Judgment and thought content normal. Cognition and memory are normal.  Nursing note and  vitals reviewed. Back Exam:    Inspection:  Normal spinal curvature.  No deformity, ecchymosis, erythema, or lesions     Palpation:     Midline spinal tenderness: no      Paralumbar tenderness: yes Right     Parathoracic tenderness: no      Buttocks tenderness: no     Range of Motion:      Flexion: Fingers to Knees     Extension:Decreased      Lateral bending:Decreased    Rotation:Decreased    Neuro Exam:Lower extremity DTRs normal & symmetric.  Strength and sensation intact.    Special Tests:      Straight leg raise:negative   Results for orders placed or performed in visit on 05/23/16  Comprehensive metabolic panel  Result Value Ref Range   Glucose 127 (H) 65 - 99 mg/dL   BUN 15 6 - 24 mg/dL   Creatinine, Ser 1.610.84 0.76 - 1.27 mg/dL   GFR calc non Af Amer 107 >59 mL/min/1.73   GFR calc Af Amer 124 >59 mL/min/1.73   BUN/Creatinine Ratio 18 9 - 20   Sodium 142 134 - 144 mmol/L   Potassium 3.9 3.5 - 5.2 mmol/L   Chloride 105 96 - 106 mmol/L   CO2 22 18 - 29 mmol/L   Calcium 9.0 8.7 - 10.2 mg/dL   Total Protein 6.8 6.0 - 8.5 g/dL   Albumin 4.7 3.5 - 5.5 g/dL   Globulin, Total 2.1 1.5 - 4.5 g/dL   Albumin/Globulin Ratio 2.2 1.2 - 2.2   Bilirubin Total 0.3 0.0 - 1.2 mg/dL   Alkaline Phosphatase 65 39 - 117 IU/L   AST 23 0 - 40 IU/L   ALT 26 0 - 44 IU/L  Lipid Panel Piccolo, Waived  Result Value Ref Range   Cholesterol Piccolo, Waived 140 <200 mg/dL   HDL Chol Piccolo, Waived 58 >59 mg/dL   Triglycerides Piccolo,Waived 140 <150 mg/dL   Chol/HDL Ratio Piccolo,Waive 2.4 mg/dL   LDL Chol Calc Piccolo Waived 54 <100 mg/dL   VLDL Chol Calc Piccolo,Waive 28 <30 mg/dL      Assessment & Plan:   Problem List Items Addressed This Visit      Cardiovascular and Mediastinum   Hypertension - Primary    Under good control. Continue current regimen. Continue to monitor. Call with any concerns.       Relevant Medications   amLODipine (NORVASC) 10 MG tablet   atorvastatin (LIPITOR) 40 MG tablet   losartan (COZAAR) 25 MG tablet   Other Relevant Orders   Comprehensive metabolic panel   Microalbumin, Urine Waived     Other   Hyperlipidemia    Under good control. Continue current regimen. Continue to monitor. Call with any concerns.       Relevant Medications   amLODipine (NORVASC) 10 MG tablet   atorvastatin (LIPITOR)  40 MG tablet   losartan (COZAAR) 25 MG tablet   Other Relevant Orders   Comprehensive metabolic panel   Lipid Panel w/o Chol/HDL Ratio    Other Visit Diagnoses    Acute right-sided low back pain with bilateral sciatica       Will treat with exercises, flexeril and ibuprofen. Call if not getting better or getting worse and we will get into PT.    Relevant Medications   ibuprofen (ADVIL,MOTRIN) 800 MG tablet   cyclobenzaprine (FLEXERIL) 10 MG tablet       Follow up plan: Return in about 6 months (around 05/12/2017) for Physical.

## 2016-11-10 ENCOUNTER — Encounter: Payer: Self-pay | Admitting: Family Medicine

## 2016-11-10 LAB — COMPREHENSIVE METABOLIC PANEL
ALBUMIN: 4.7 g/dL (ref 3.5–5.5)
ALK PHOS: 56 IU/L (ref 39–117)
ALT: 13 IU/L (ref 0–44)
AST: 20 IU/L (ref 0–40)
Albumin/Globulin Ratio: 2 (ref 1.2–2.2)
BILIRUBIN TOTAL: 0.6 mg/dL (ref 0.0–1.2)
BUN / CREAT RATIO: 16 (ref 9–20)
BUN: 15 mg/dL (ref 6–24)
CHLORIDE: 102 mmol/L (ref 96–106)
CO2: 24 mmol/L (ref 18–29)
Calcium: 9.4 mg/dL (ref 8.7–10.2)
Creatinine, Ser: 0.93 mg/dL (ref 0.76–1.27)
GFR calc Af Amer: 115 mL/min/{1.73_m2} (ref >59)
GFR calc non Af Amer: 99 mL/min/{1.73_m2} (ref >59)
GLUCOSE: 88 mg/dL (ref 65–99)
Globulin, Total: 2.3 g/dL (ref 1.5–4.5)
Potassium: 3.9 mmol/L (ref 3.5–5.2)
Sodium: 143 mmol/L (ref 134–144)
Total Protein: 7 g/dL (ref 6.0–8.5)

## 2016-11-10 LAB — LIPID PANEL W/O CHOL/HDL RATIO
CHOLESTEROL TOTAL: 204 mg/dL — AB (ref 100–199)
HDL: 54 mg/dL (ref >39)
LDL Calculated: 119 mg/dL — ABNORMAL HIGH (ref 0–99)
Triglycerides: 153 mg/dL — ABNORMAL HIGH (ref 0–149)
VLDL CHOLESTEROL CAL: 31 mg/dL (ref 5–40)

## 2016-11-11 ENCOUNTER — Telehealth: Payer: Self-pay | Admitting: Family Medicine

## 2016-11-11 NOTE — Telephone Encounter (Signed)
PT order up front for him to pick up. Can call for chiropractic most of the time without a referral. Let me know if his insurance requires one.

## 2016-11-11 NOTE — Telephone Encounter (Signed)
Patient calling because his back is hurting seems to be getting worse. Patient wants to know if he needs to come back for another appointment or if he can get a referral to a physical therapist or chiropractic for his back. Patient would like a call back informing him whether he would need a referral or if he needs to come back.   Please Advise.  Thank you

## 2016-11-11 NOTE — Telephone Encounter (Signed)
Called patient back and informed him about that we have the  Order for PT ready in the offcie. Also informed pt. That he should not need an referral for the chiropractic but if so to left us know. Patient was receptive to all information. Patient also asks if he can get an order to work from home for a small time for his back to heal.  Thank you

## 2016-11-14 ENCOUNTER — Encounter: Payer: Self-pay | Admitting: Family Medicine

## 2016-11-14 NOTE — Telephone Encounter (Signed)
Note up front for him too. OK for him to come pick up

## 2016-11-14 NOTE — Telephone Encounter (Signed)
Called and left a voicemail on patient's phone informing him the letter is ready to pick-up.

## 2016-11-14 NOTE — Telephone Encounter (Signed)
Patient came in and picked up the letter for work.

## 2016-11-14 NOTE — Telephone Encounter (Signed)
Patient came to pick up his PT order. Patient asked for a call when the letter would be ready.

## 2016-11-22 ENCOUNTER — Encounter: Payer: BLUE CROSS/BLUE SHIELD | Admitting: Family Medicine

## 2017-01-11 ENCOUNTER — Ambulatory Visit: Payer: BLUE CROSS/BLUE SHIELD | Admitting: Family Medicine

## 2017-01-16 ENCOUNTER — Ambulatory Visit: Payer: BLUE CROSS/BLUE SHIELD | Admitting: Family Medicine

## 2017-02-17 ENCOUNTER — Ambulatory Visit (INDEPENDENT_AMBULATORY_CARE_PROVIDER_SITE_OTHER): Payer: BLUE CROSS/BLUE SHIELD | Admitting: Family Medicine

## 2017-02-17 ENCOUNTER — Encounter: Payer: Self-pay | Admitting: Family Medicine

## 2017-02-17 VITALS — BP 117/77 | HR 81 | Wt 146.0 lb

## 2017-02-17 DIAGNOSIS — F321 Major depressive disorder, single episode, moderate: Secondary | ICD-10-CM | POA: Diagnosis not present

## 2017-02-17 DIAGNOSIS — Z79899 Other long term (current) drug therapy: Secondary | ICD-10-CM | POA: Insufficient documentation

## 2017-02-17 MED ORDER — CLONAZEPAM 0.5 MG PO TABS: 0.5000 mg | ORAL_TABLET | Freq: Two times a day (BID) | ORAL | 0 refills | Status: DC | PRN

## 2017-02-17 MED ORDER — ESCITALOPRAM OXALATE 10 MG PO TABS: 10.0000 mg | ORAL_TABLET | Freq: Every day | ORAL | 1 refills | Status: DC

## 2017-02-17 NOTE — Assessment & Plan Note (Signed)
For klonapin. See scanned document.

## 2017-02-17 NOTE — Assessment & Plan Note (Signed)
Will start on lexapro and klonopin until meds start working. Controlled substance agreement signed today. Recheck 1 month. Call with any concerns.

## 2017-02-17 NOTE — Progress Notes (Signed)
BP 117/77   Pulse 81   Wt 146 lb (66.2 kg)   SpO2 99%   BMI 22.87 kg/m    Subjective:    Patient ID: Shawn Ware, male    DOB: [birth year], 46 y.o.   MRN: [id number]  HPI: Shawn Ware is a 46 y.o. male  Chief Complaint  Patient presents with  . Back Pain    bETTER  . Stress    would like meds   Here today for follow up on his back- much better. Feeling like himself again. No pain.   ANXIETY/STRESS- has been really bad for about 2-3 weeks. Had a bad break-up, has been having a lot of trouble with work and some family stuff that's been going on. Not feeling like himself Duration: 2-3 weeks Anxious mood: yes  Excessive worrying: yes Irritability: yes  Sweating: no Nausea: yes Palpitations:no Hyperventilation: no Panic attacks: no Agoraphobia: no  Obscessions/compulsions: yes Depressed mood: yes Depression screen PHQ 2/9 02/17/2017  Decreased Interest 3  Down, Depressed, Hopeless 3  PHQ - 2 Score 6  Altered sleeping 3  Tired, decreased energy 3  Change in appetite 3  Feeling bad or failure about yourself  3  Trouble concentrating 3  Moving slowly or fidgety/restless 0  Suicidal thoughts 0  PHQ-9 Score 21   GAD 7 : Generalized Anxiety Score 02/17/2017  Nervous, Anxious, on Edge 3  Control/stop worrying 3  Worry too much - different things 3  Trouble relaxing 3  Restless 3  Easily annoyed or irritable 3  Afraid - awful might happen 3  Total GAD 7 Score 21  Anxiety Difficulty Somewhat difficult   Anhedonia: no Weight changes: yes Insomnia: yes hard to stay asleep and fall asleep Hypersomnia: no Fatigue/loss of energy: yes Feelings of worthlessness: yes Feelings of guilt: yes Impaired concentration/indecisiveness: yes Suicidal ideations: no  Crying spells: no Recent Stressors/Life Changes: yes   Relationship problems: yes   Family stress: yes     Financial stress: yes    Job stress: yes    Recent death/loss: no  Relevant past  medical, surgical, family and social history reviewed and updated as indicated. Interim medical history since our last visit reviewed. Allergies and medications reviewed and updated.  Review of Systems  Constitutional: Negative.   Respiratory: Negative.   Cardiovascular: Negative.   Psychiatric/Behavioral: Positive for agitation, decreased concentration, dysphoric mood and sleep disturbance. Negative for behavioral problems, confusion, hallucinations, self-injury and suicidal ideas. The patient is nervous/anxious. The patient is not hyperactive.     Per HPI unless specifically indicated above     Objective:    BP 117/77   Pulse 81   Wt 146 lb (66.2 kg)   SpO2 99%   BMI 22.87 kg/m   Wt Readings from Last 3 Encounters:  02/17/17 146 lb (66.2 kg)  11/09/16 151 lb (68.5 kg)  05/23/16 155 lb 4.8 oz (70.4 kg)    Physical Exam  Constitutional: He is oriented to person, place, and time. He appears well-developed and well-nourished. No distress.  HENT:  Head: Normocephalic and atraumatic.  Right Ear: Hearing normal.  Left Ear: Hearing normal.  Nose: Nose normal.  Eyes: Conjunctivae and lids are normal. Right eye exhibits no discharge. Left eye exhibits no discharge. No scleral icterus.  Cardiovascular: Normal rate, regular rhythm, normal heart sounds and intact distal pulses.  Exam reveals no gallop and no friction rub.   No murmur heard. Pulmonary/Chest: Effort normal and breath sounds normal. No  respiratory distress. He has no wheezes. He has no rales. He exhibits no tenderness.  Musculoskeletal: Normal range of motion.  Neurological: He is alert and oriented to person, place, and time.  Skin: Skin is warm, dry and intact. No rash noted. He is not diaphoretic. No erythema. No pallor.  Psychiatric: He has a normal mood and affect. His speech is normal and behavior is normal. Judgment and thought content normal. Cognition and memory are normal.  Nursing note and vitals  reviewed.   Results for orders placed or performed in visit on 11/09/16  Comprehensive metabolic panel  Result Value Ref Range   Glucose 88 65 - 99 mg/dL   BUN 15 6 - 24 mg/dL   Creatinine, Ser 7.86 0.76 - 1.27 mg/dL   GFR calc non Af Amer 99 >59 mL/min/1.73   GFR calc Af Amer 115 >59 mL/min/1.73   BUN/Creatinine Ratio 16 9 - 20   Sodium 143 134 - 144 mmol/L   Potassium 3.9 3.5 - 5.2 mmol/L   Chloride 102 96 - 106 mmol/L   CO2 24 18 - 29 mmol/L   Calcium 9.4 8.7 - 10.2 mg/dL   Total Protein 7.0 6.0 - 8.5 g/dL   Albumin 4.7 3.5 - 5.5 g/dL   Globulin, Total 2.3 1.5 - 4.5 g/dL   Albumin/Globulin Ratio 2.0 1.2 - 2.2   Bilirubin Total 0.6 0.0 - 1.2 mg/dL   Alkaline Phosphatase 56 39 - 117 IU/L   AST 20 0 - 40 IU/L   ALT 13 0 - 44 IU/L  Lipid Panel w/o Chol/HDL Ratio  Result Value Ref Range   Cholesterol, Total 204 (H) 100 - 199 mg/dL   Triglycerides 767 (H) 0 - 149 mg/dL   HDL 54 >20 mg/dL   VLDL Cholesterol Cal 31 5 - 40 mg/dL   LDL Calculated 947 (H) 0 - 99 mg/dL      Assessment & Plan:   Problem List Items Addressed This Visit      Other   Depression, major, single episode, moderate (HCC) - Primary    Will start on lexapro and klonopin until meds start working. Controlled substance agreement signed today. Recheck 1 month. Call with any concerns.       Relevant Medications   escitalopram (LEXAPRO) 10 MG tablet   Controlled substance agreement signed    For klonapin. See scanned document.           Follow up plan: Return in about 4 weeks (around 03/17/2017) for Follow up mood.

## 2017-02-25 LAB — HM HIV SCREENING LAB: HM HIV Screening: NEGATIVE

## 2017-03-17 ENCOUNTER — Ambulatory Visit: Payer: BLUE CROSS/BLUE SHIELD | Admitting: Family Medicine

## 2017-03-24 ENCOUNTER — Ambulatory Visit: Payer: BLUE CROSS/BLUE SHIELD | Admitting: Family Medicine

## 2017-03-27 ENCOUNTER — Encounter: Payer: Self-pay | Admitting: Family Medicine

## 2017-03-27 ENCOUNTER — Ambulatory Visit (INDEPENDENT_AMBULATORY_CARE_PROVIDER_SITE_OTHER): Payer: BLUE CROSS/BLUE SHIELD | Admitting: Family Medicine

## 2017-03-27 VITALS — BP 131/79 | HR 80 | Temp 99.1°F | Wt 144.2 lb

## 2017-03-27 DIAGNOSIS — F321 Major depressive disorder, single episode, moderate: Secondary | ICD-10-CM | POA: Diagnosis not present

## 2017-03-27 MED ORDER — CLONAZEPAM 0.5 MG PO TABS: 0.5000 mg | ORAL_TABLET | Freq: Two times a day (BID) | ORAL | 0 refills | Status: DC | PRN

## 2017-03-27 NOTE — Progress Notes (Signed)
BP 131/79 (BP Location: Left Arm, Patient Position: Sitting, Cuff Size: Normal)   Pulse 80   Temp 99.1 F (37.3 C)   Wt 144 lb 3 oz (65.4 kg)   SpO2 98%   BMI 22.58 kg/m    Subjective:    Patient ID: Shawn Ware, male    DOB: Mar 10, 1971, 46 y.o.   MRN: [id number]  HPI: Shawn Ware is a 46 y.o. male  Chief Complaint  Patient presents with  . Anxiety   ANXIETY/STRESS- didn't take the lexapro, has only been taking his klonopin.  Duration:better Anxious mood: yes  Excessive worrying: no Irritability: no  Sweating: no Nausea: no Palpitations:no Hyperventilation: no Panic attacks: no Agoraphobia: no  Obscessions/compulsions: no Depressed mood: yes Depression screen [facility] 2/9 03/27/2017 02/17/2017  Decreased Interest 0 3  Down, Depressed, Hopeless 1 3  PHQ - 2 Score 1 6  Altered sleeping 2 3  Tired, decreased energy 1 3  Change in appetite 0 3  Feeling bad or failure about yourself  0 3  Trouble concentrating 0 3  Moving slowly or fidgety/restless 0 0  Suicidal thoughts 0 0  PHQ-9 Score 4 21   Anhedonia: no Weight changes: no Insomnia: no   Hypersomnia: no Fatigue/loss of energy: no Feelings of worthlessness: no Feelings of guilt: no Impaired concentration/indecisiveness: no Suicidal ideations: no  Crying spells: no Recent Stressors/Life Changes: no   Relevant past medical, surgical, family and social history reviewed and updated as indicated. Interim medical history since our last visit reviewed. Allergies and medications reviewed and updated.  Review of Systems  Constitutional: Negative.   Respiratory: Negative.   Cardiovascular: Negative.   Psychiatric/Behavioral: Negative.     Per HPI unless specifically indicated above     Objective:    BP 131/79 (BP Location: Left Arm, Patient Position: Sitting, Cuff Size: Normal)   Pulse 80   Temp 99.1 F (37.3 C)   Wt 144 lb 3 oz (65.4 kg)   SpO2 98%   BMI 22.58 kg/m   Wt Readings from  Last 3 Encounters:  03/27/17 144 lb 3 oz (65.4 kg)  02/17/17 146 lb (66.2 kg)  11/09/16 151 lb (68.5 kg)    Physical Exam  Constitutional: He is oriented to person, place, and time. He appears well-developed and well-nourished. No distress.  HENT:  Head: Normocephalic and atraumatic.  Right Ear: Hearing normal.  Left Ear: Hearing normal.  Nose: Nose normal.  Eyes: Conjunctivae and lids are normal. Right eye exhibits no discharge. Left eye exhibits no discharge. No scleral icterus.  Cardiovascular: Normal rate, regular rhythm, normal heart sounds and intact distal pulses.  Exam reveals no gallop and no friction rub.   No murmur heard. Pulmonary/Chest: Effort normal and breath sounds normal. No respiratory distress. He has no wheezes. He has no rales. He exhibits no tenderness.  Musculoskeletal: Normal range of motion.  Neurological: He is alert and oriented to person, place, and time.  Skin: Skin is warm, dry and intact. No rash noted. He is not diaphoretic. No erythema. No pallor.  Psychiatric: He has a normal mood and affect. His speech is normal and behavior is normal. Judgment and thought content normal. Cognition and memory are normal.  Nursing note and vitals reviewed.   Results for orders placed or performed in visit on 03/27/17  HM HIV SCREENING LAB  Result Value Ref Range   HM HIV Screening Negative - Patient reported       Assessment & Plan:  Problem List Items Addressed This Visit      Other   Depression, major, single episode, moderate (HCC) - Primary    Improved. Did not take the lexapro. Taking the klonopin PRN. Refill given- this Rx should last at least 4 months. Call with any concerns.           Follow up plan: Return November, for Physical/Follow up BP.

## 2017-03-27 NOTE — Assessment & Plan Note (Signed)
Improved. Did not take the lexapro. Taking the klonopin PRN. Refill given- this Rx should last at least 4 months. Call with any concerns.

## 2017-05-12 ENCOUNTER — Encounter: Payer: Self-pay | Admitting: Family Medicine

## 2017-05-24 ENCOUNTER — Other Ambulatory Visit: Payer: Self-pay | Admitting: Family Medicine

## 2017-05-24 ENCOUNTER — Telehealth: Payer: Self-pay | Admitting: Family Medicine

## 2017-05-24 NOTE — Telephone Encounter (Signed)
Per Dr. Laural BenesJohnson.  Rx written 10/1 should last 4 months.  Refill not due

## 2017-05-24 NOTE — Telephone Encounter (Signed)
Copied from CRM [phone]. Topic: General - Other >> May 24, 2017  8:37 AM Shawn Ware, Shawn Ware, RMA wrote: Reason for CRM: patient is requesting medication refill for clonazepam 0.5 mg to be sent to [facility], please call pt once medication has been sent to pharmacy [phone]

## 2017-05-25 MED ORDER — CLONAZEPAM 0.5 MG PO TABS: 0.5000 mg | ORAL_TABLET | Freq: Two times a day (BID) | ORAL | 0 refills | Status: DC | PRN

## 2017-05-25 NOTE — Telephone Encounter (Signed)
We discussed at his appointment that his Rx was supposed to last 4 months. We can call in some pills to get him to his appointment in December, but we will have to discuss this at his appointment on the 17th.  Please call in his klonopin. Thanks.

## 2017-05-25 NOTE — Telephone Encounter (Signed)
Needs an appointment. He is not supposed to be taking it that often. Will not get refill until he is seen.

## 2017-05-25 NOTE — Telephone Encounter (Signed)
Called and left patient a VM letting him know what Dr. Laural BenesJohnson said. RX called into [facility].

## 2017-05-25 NOTE — Telephone Encounter (Signed)
Routing to provider  

## 2017-05-25 NOTE — Telephone Encounter (Signed)
Called and left patient a VM asking for him to please return the call to discuss medication. Dr. Laural BenesJohnson documented in 03/27/17 note that this klonopin RX should last 4 months (RX was written that day). Has the patient been taking the medication more often recently? OK for [facility] to let patient know about medication and to ask how often he has been taking it.

## 2017-05-25 NOTE — Telephone Encounter (Signed)
Please get patient an appointment scheduled ASAP per Dr. Laural BenesJohnson. See messages below.

## 2017-05-25 NOTE — Telephone Encounter (Signed)
Spoke with pt and he stated that his prescription was only for 2 months. He stated that he only needed enough to last you until his appt 06/12/17. Pt did not want to schedule another appt.

## 2017-05-25 NOTE — Telephone Encounter (Signed)
Spoke with pt. About his Klonopin - he reports he takes 1-2 tablets every day. Pt. States he received 60 tablets on 03/27/17. He is completely out now.

## 2017-06-12 ENCOUNTER — Encounter: Payer: Self-pay | Admitting: Family Medicine

## 2017-06-13 ENCOUNTER — Encounter: Payer: Self-pay | Admitting: Family Medicine

## 2017-06-13 ENCOUNTER — Ambulatory Visit: Payer: BLUE CROSS/BLUE SHIELD | Admitting: Family Medicine

## 2017-06-13 VITALS — BP 126/80 | HR 83 | Temp 91.4°F | Wt 148.2 lb

## 2017-06-13 DIAGNOSIS — I10 Essential (primary) hypertension: Secondary | ICD-10-CM | POA: Diagnosis not present

## 2017-06-13 DIAGNOSIS — E782 Mixed hyperlipidemia: Secondary | ICD-10-CM

## 2017-06-13 DIAGNOSIS — F321 Major depressive disorder, single episode, moderate: Secondary | ICD-10-CM

## 2017-06-13 DIAGNOSIS — G4700 Insomnia, unspecified: Secondary | ICD-10-CM | POA: Diagnosis not present

## 2017-06-13 MED ORDER — AMLODIPINE BESYLATE 10 MG PO TABS: ORAL_TABLET | ORAL | 1 refills | Status: DC

## 2017-06-13 MED ORDER — TRAZODONE HCL 50 MG PO TABS: 50.0000 mg | ORAL_TABLET | Freq: Every evening | ORAL | 6 refills | Status: DC | PRN

## 2017-06-13 MED ORDER — ATORVASTATIN CALCIUM 40 MG PO TABS: 40.0000 mg | ORAL_TABLET | Freq: Every day | ORAL | 1 refills | Status: DC

## 2017-06-13 MED ORDER — CLONAZEPAM 0.5 MG PO TABS: 0.5000 mg | ORAL_TABLET | Freq: Two times a day (BID) | ORAL | 1 refills | Status: DC | PRN

## 2017-06-13 NOTE — Assessment & Plan Note (Signed)
Will continue PRN klonopin- discussed that this should only be taken when he really needs it. 30 pills with 1 refill given- should last at least 4 months.

## 2017-06-13 NOTE — Assessment & Plan Note (Signed)
Under good control. Continue current regimen. Continue to monitor. Call with any concerns. 

## 2017-06-13 NOTE — Progress Notes (Signed)
BP 126/80 (BP Location: Left Arm, Patient Position: Sitting, Cuff Size: Normal)   Pulse 83   Temp (!) 91.4 F (33 C)   Wt 148 lb 4 oz (67.2 kg)   SpO2 98%   BMI 23.22 kg/m    Subjective:    Patient ID: Shawn Ware, male    DOB: 06/22/1971, 46 y.o.   MRN: [id number]  HPI: Shawn HarveyDarius Mamoru Krohn is a 46 y.o. male  Chief Complaint  Patient presents with  . Anxiety   ANXIETY/DEPRESSION- had been taking his klonopin every day 2x a day, now just taking it at bed time to help him sleep- taking it every night.  Duration:better Anxious mood: yes  Excessive worrying: no Irritability: no  Sweating: no Nausea: no Palpitations:no Hyperventilation: no Panic attacks: no Agoraphobia: no  Obscessions/compulsions: no Depressed mood: no [facility] 03/27/2017 02/17/2017  Decreased Interest 0 3  Down, Depressed, Hopeless 1 3  PHQ - 2 Score 1 6  Altered sleeping 2 3  Tired, decreased energy 1 3  Change in appetite 0 3  Feeling bad or failure about yourself  0 3  Trouble concentrating 0 3  Moving slowly or fidgety/restless 0 0  Suicidal thoughts 0 0  PHQ-9 Score 4 21   Anhedonia: no Weight changes: no Insomnia: yes hard to fall asleep and staying asleep Hypersomnia: no Fatigue/loss of energy: yes Feelings of worthlessness: no Feelings of guilt: no Impaired concentration/indecisiveness: no Suicidal ideations: no  Crying spells: no Recent Stressors/Life Changes: no   Relationship problems: no   Family stress: yes     Financial stress: no    Job stress: no    Recent death/loss: no  HYPERTENSION / HYPERLIPIDEMIA Satisfied with current treatment? yes Duration of hypertension: chronic BP monitoring frequency: not checking BP medication side effects: no Past BP meds: amlodipine (allergic to lisinopril) Duration of hyperlipidemia: chronic Cholesterol medication side effects: no Cholesterol supplements: none Past cholesterol medications:  atorvastatin Medication compliance: excellent compliance Aspirin: no Recent stressors: yes Recurrent headaches: no Visual changes: no Palpitations: no Dyspnea: no Chest pain: no Lower extremity edema: no Dizzy/lightheaded: no    Relevant past medical, surgical, family and social history reviewed and updated as indicated. Interim medical history since our last visit reviewed. Allergies and medications reviewed and updated.  Review of Systems  Constitutional: Negative.   Respiratory: Negative.   Cardiovascular: Negative.   Psychiatric/Behavioral: Positive for sleep disturbance. Negative for agitation, behavioral problems, confusion, decreased concentration, dysphoric mood, hallucinations, self-injury and suicidal ideas. The patient is nervous/anxious. The patient is not hyperactive.     Per HPI unless specifically indicated above     Objective:    BP 126/80 (BP Location: Left Arm, Patient Position: Sitting, Cuff Size: Normal)   Pulse 83   Temp (!) 91.4 F (33 C)   Wt 148 lb 4 oz (67.2 kg)   SpO2 98%   BMI 23.22 kg/m   Wt Readings from Last 3 Encounters:  06/13/17 148 lb 4 oz (67.2 kg)  03/27/17 144 lb 3 oz (65.4 kg)  02/17/17 146 lb (66.2 kg)    Physical Exam  Constitutional: He is oriented to person, place, and time. He appears well-developed and well-nourished. No distress.  HENT:  Head: Normocephalic and atraumatic.  Right Ear: Hearing normal.  Left Ear: Hearing normal.  Nose: Nose normal.  Eyes: Conjunctivae and lids are normal. Right eye exhibits no discharge. Left eye exhibits no discharge. No scleral icterus.  Cardiovascular: Normal rate, regular  rhythm, normal heart sounds and intact distal pulses. Exam reveals no gallop and no friction rub.  No murmur heard. Pulmonary/Chest: Effort normal and breath sounds normal. No respiratory distress. He has no wheezes. He has no rales. He exhibits no tenderness.  Musculoskeletal: Normal range of motion.  Neurological:  He is alert and oriented to person, place, and time.  Skin: Skin is warm, dry and intact. No rash noted. He is not diaphoretic. No erythema. No pallor.  Psychiatric: He has a normal mood and affect. His speech is normal and behavior is normal. Judgment and thought content normal. Cognition and memory are normal.    Results for orders placed or performed in visit on 03/27/17  [facility] HIV SCREENING LAB  Result Value Ref Range   HM HIV Screening Negative - Patient reported       Assessment & Plan:   Problem List Items Addressed This Visit      Cardiovascular and Mediastinum   Hypertension    Under good control. Continue current regimen. Continue to monitor. Call with any concerns.       Relevant Medications   atorvastatin (LIPITOR) 40 MG tablet   amLODipine (NORVASC) 10 MG tablet     Other   Hyperlipidemia    Under good control. Continue current regimen. Continue to monitor. Call with any concerns.       Relevant Medications   atorvastatin (LIPITOR) 40 MG tablet   amLODipine (NORVASC) 10 MG tablet   Depression, major, single episode, moderate (HCC) - Primary    Will continue PRN klonopin- discussed that this should only be taken when he really needs it. 30 pills with 1 refill given- should last at least 4 months.       Relevant Medications   traZODone (DESYREL) 50 MG tablet    Other Visit Diagnoses    Insomnia, unspecified type       Will treat with trazodone and see how he does. Recheck by phone in 2 weeks.        Follow up plan: Return in about 6 months (around 12/12/2017) for Physical.

## 2017-07-03 ENCOUNTER — Telehealth: Payer: Self-pay | Admitting: Family Medicine

## 2017-07-03 NOTE — Telephone Encounter (Signed)
-----   Message from Dorcas CarrowMegan P Damarkus Balis, DO sent at 06/13/2017  1:43 PM EST ----- Call about his sleep

## 2017-07-03 NOTE — Telephone Encounter (Signed)
Called to see how he's doing with his sleep on the trazodone. LMOM for him to call back. CRM generated.

## 2017-07-04 NOTE — Telephone Encounter (Signed)
Will discuss any medication changes at his scheduled appointment.

## 2017-07-04 NOTE — Telephone Encounter (Signed)
Patient returned call- he is sorry he missed call- but glad for the call. He reports the medication did not work well for him. He felt light headed and dizzy after taking it before bed and he has since discontinued it. He would like to discuss another alternative if possible.  While on the phone he did request an appointment for low grade fever and vomiting he had started yesterday. Appointment offered today and he declined. Appointment made for Thursday at his request and he was advised to call in for sooner appointment if his symptoms got worse- he agrees.

## 2017-07-04 NOTE — Telephone Encounter (Signed)
Routing to provider  

## 2017-07-06 ENCOUNTER — Encounter: Payer: Self-pay | Admitting: Family Medicine

## 2017-07-06 ENCOUNTER — Ambulatory Visit: Payer: BLUE CROSS/BLUE SHIELD | Admitting: Family Medicine

## 2017-07-06 VITALS — BP 116/82 | HR 80 | Temp 98.8°F | Wt 148.2 lb

## 2017-07-06 DIAGNOSIS — G4700 Insomnia, unspecified: Secondary | ICD-10-CM | POA: Insufficient documentation

## 2017-07-06 MED ORDER — SUVOREXANT 10 MG PO TABS: 1.0000 | ORAL_TABLET | Freq: Every evening | ORAL | 3 refills | Status: DC | PRN

## 2017-07-06 NOTE — Assessment & Plan Note (Signed)
Will start belsomra. Did not tolerate melatonin, benadryl or trazodone. Rx given. Follow up by phone in 2 weeks.

## 2017-07-06 NOTE — Progress Notes (Signed)
BP 116/82 (BP Location: Left Arm, Cuff Size: Normal)   Pulse 80   Temp 98.8 F (37.1 C)   Wt 148 lb 4 oz (67.2 kg)   SpO2 99%   BMI 23.22 kg/m    Subjective:    Patient ID: Shawn Ware, male    DOB: 1970/08/05, 47 y.o.   MRN: [id number]  HPI: Shawn Ware is a 47 y.o. male  Chief Complaint  Patient presents with  . sleep problems   INSOMNIA- tried the trazodone, made him feel dizzy and loopy, tried just taking it 1x and didn't take it again. Didn't try a 1/2 of one. Has been taking the klonopin when he can't sleep, usually every 2-3 nights Duration: chronic Satisfied with sleep quality: no Difficulty falling asleep: yes Difficulty staying asleep: yes Waking a few hours after sleep onset: yes Early morning awakenings: yes Daytime hypersomnolence: no Wakes feeling refreshed: no Good sleep hygiene: yes Apnea: no Snoring: no Depressed/anxious mood: yes Recent stress: yes Restless legs/nocturnal leg cramps: no Chronic pain/arthritis: no History of sleep study: no Treatments attempted: trazodone, melatonin and benadryl, klonopin   Relevant past medical, surgical, family and social history reviewed and updated as indicated. Interim medical history since our last visit reviewed. Allergies and medications reviewed and updated.  Review of Systems  Constitutional: Negative.   Respiratory: Negative.   Cardiovascular: Negative.   Neurological: Negative.   Psychiatric/Behavioral: Positive for sleep disturbance. Negative for agitation, behavioral problems, confusion, decreased concentration, dysphoric mood, hallucinations, self-injury and suicidal ideas. The patient is not nervous/anxious and is not hyperactive.     Per HPI unless specifically indicated above     Objective:    BP 116/82 (BP Location: Left Arm, Cuff Size: Normal)   Pulse 80   Temp 98.8 F (37.1 C)   Wt 148 lb 4 oz (67.2 kg)   SpO2 99%   BMI 23.22 kg/m   Wt Readings from Last 3  Encounters:  07/06/17 148 lb 4 oz (67.2 kg)  06/13/17 148 lb 4 oz (67.2 kg)  03/27/17 144 lb 3 oz (65.4 kg)    Physical Exam  Constitutional: He is oriented to person, place, and time. He appears well-developed and well-nourished. No distress.  HENT:  Head: Normocephalic and atraumatic.  Right Ear: Hearing normal.  Left Ear: Hearing normal.  Nose: Nose normal.  Eyes: Conjunctivae and lids are normal. Right eye exhibits no discharge. Left eye exhibits no discharge. No scleral icterus.  Cardiovascular: Normal rate, regular rhythm, normal heart sounds and intact distal pulses. Exam reveals no gallop and no friction rub.  No murmur heard. Pulmonary/Chest: Effort normal and breath sounds normal. No respiratory distress. He has no wheezes. He has no rales. He exhibits no tenderness.  Musculoskeletal: Normal range of motion.  Neurological: He is alert and oriented to person, place, and time.  Skin: Skin is warm, dry and intact. No rash noted. No erythema. No pallor.  Psychiatric: He has a normal mood and affect. His speech is normal and behavior is normal. Judgment and thought content normal. Cognition and memory are normal.  Nursing note and vitals reviewed.   Results for orders placed or performed in visit on 03/27/17  [facility] HIV SCREENING LAB  Result Value Ref Range   HM HIV Screening Negative - Patient reported       Assessment & Plan:   Problem List Items Addressed This Visit      Other   Insomnia - Primary    Will  start belsomra. Did not tolerate melatonin, benadryl or trazodone. Rx given. Follow up by phone in 2 weeks.           Follow up plan: Return As scheduled, but phone in 2 weeks. .Marland Kitchen

## 2017-07-19 ENCOUNTER — Telehealth: Payer: Self-pay | Admitting: Family Medicine

## 2017-07-19 NOTE — Telephone Encounter (Signed)
Copied from CRM [phone]. Topic: Quick Communication - See Telephone Encounter >> Jul 19, 2017 11:51 AM Eston Mouldavis, Correen Bubolz B wrote: CRM for notification. See Telephone encounter for:  PT needs rx for Suvorexant (BELSOMRA) 10 MG TABS sent to [facility] - [place], [place] - [address] [phone] (Phone) [phone] (Fax)  He went to pharmacy to pick up med and was told no rx had been sent.  07/19/17.

## 2017-07-20 MED ORDER — SUVOREXANT 10 MG PO TABS: 1.0000 | ORAL_TABLET | Freq: Every evening | ORAL | 3 refills | Status: DC | PRN

## 2017-07-20 NOTE — Telephone Encounter (Signed)
He would have been given a printed Rx- if he doesn't have it, I can fax in another one.

## 2017-07-20 NOTE — Telephone Encounter (Signed)
All set!

## 2017-07-20 NOTE — Telephone Encounter (Signed)
Please print another prescription and I will fax it to [facility].

## 2017-08-01 ENCOUNTER — Ambulatory Visit: Payer: BLUE CROSS/BLUE SHIELD | Admitting: Family Medicine

## 2017-08-01 ENCOUNTER — Encounter: Payer: Self-pay | Admitting: Family Medicine

## 2017-08-01 VITALS — BP 118/77 | HR 86 | Temp 97.9°F | Wt 147.3 lb

## 2017-08-01 DIAGNOSIS — R197 Diarrhea, unspecified: Secondary | ICD-10-CM

## 2017-08-01 DIAGNOSIS — H538 Other visual disturbances: Secondary | ICD-10-CM

## 2017-08-01 DIAGNOSIS — R1084 Generalized abdominal pain: Secondary | ICD-10-CM

## 2017-08-01 DIAGNOSIS — I10 Essential (primary) hypertension: Secondary | ICD-10-CM | POA: Diagnosis not present

## 2017-08-01 NOTE — Progress Notes (Signed)
BP 118/77 (BP Location: Left Arm, Patient Position: Sitting, Cuff Size: Normal)   Pulse 86   Temp 97.9 F (36.6 C)   Wt 147 lb 5 oz (66.8 kg)   SpO2 99%   BMI 23.07 kg/m    Subjective:    Patient ID: Shawn Ware, male    DOB: 01/19/1971, 47 y.o.   MRN: [id number]  HPI: Shawn Ware is a 47 y.o. male  Chief Complaint  Patient presents with  . Nausea    vomiting, diarrhea stomach pain, started on Saturday, with some possible fever and chills  . Blurred Vision   ABDOMINAL PAIN- has been feeling a little run down, but went to [employer] and got something to go. Only ate a little bit, but then started feeling really sick right after. Was feeling really cold and having chills. Threw up like 4x, next day, started feeling better on Sunday. He is back to where he should be. Has been noticing a change in his bowel movements for about 2 weeks, has been having diarrhea. Concerned that he might have a parasite   Duration: 2 weeks Onset: sudden Severity: mild Quality: cramping Location:  diffuse  Episode duration:  Radiation: no Frequency: with needing to have bowel movements Alleviating factors:  Aggravating factors: Status: stable Treatments attempted: probiotics Fever: yes- only over the weekend Nausea: yes Vomiting: yes- only over the weekend Weight loss: no Decreased appetite: no Diarrhea: yes Constipation: no Blood in stool: no Heartburn: no Jaundice: no Rash: no Dysuria/urinary frequency: no Hematuria: no History of sexually transmitted disease: no Recurrent NSAID use: no  Has been having issues with blurred vision in his R eye. Hasn't seen an eye doctor, but seeing them on Thursday.  Relevant past medical, surgical, family and social history reviewed and updated as indicated. Interim medical history since our last visit reviewed. Allergies and medications reviewed and updated.  Review of Systems  Constitutional: Positive for fatigue and fever.  Negative for activity change, appetite change, chills, diaphoresis and unexpected weight change.  Respiratory: Negative.   Cardiovascular: Negative.   Gastrointestinal: Positive for abdominal distention, abdominal pain, diarrhea and nausea. Negative for anal bleeding, blood in stool, constipation, rectal pain and vomiting.  Genitourinary: Negative.   Musculoskeletal: Negative.   Skin: Negative.   Psychiatric/Behavioral: Negative.     Per HPI unless specifically indicated above     Objective:    BP 118/77 (BP Location: Left Arm, Patient Position: Sitting, Cuff Size: Normal)   Pulse 86   Temp 97.9 F (36.6 C)   Wt 147 lb 5 oz (66.8 kg)   SpO2 99%   BMI 23.07 kg/m   Wt Readings from Last 3 Encounters:  08/01/17 147 lb 5 oz (66.8 kg)  07/06/17 148 lb 4 oz (67.2 kg)  06/13/17 148 lb 4 oz (67.2 kg)     Visual Acuity Screening   Right eye Left eye Both eyes  Without correction: 20/30 20/25 20/20   With correction:       Physical Exam  Constitutional: He is oriented to person, place, and time. He appears well-developed and well-nourished. No distress.  HENT:  Head: Normocephalic and atraumatic.  Right Ear: Hearing normal.  Left Ear: Hearing normal.  Nose: Nose normal.  Eyes: Conjunctivae and lids are normal. Right eye exhibits no discharge. Left eye exhibits no discharge. No scleral icterus.  Cardiovascular: Normal rate, regular rhythm, normal heart sounds and intact distal pulses. Exam reveals no gallop and no friction rub.  No murmur  heard. Pulmonary/Chest: Effort normal and breath sounds normal. No respiratory distress. He has no wheezes. He has no rales. He exhibits no tenderness.  Abdominal: Soft. Bowel sounds are normal. He exhibits no distension and no mass. There is no tenderness. There is no rebound and no guarding.  Musculoskeletal: Normal range of motion.  Neurological: He is alert and oriented to person, place, and time.  Skin: Skin is warm, dry and intact. No rash  noted. He is not diaphoretic. No erythema. No pallor.  Psychiatric: He has a normal mood and affect. His speech is normal and behavior is normal. Judgment and thought content normal. Cognition and memory are normal.  Nursing note and vitals reviewed.   Results for orders placed or performed in visit on 03/27/17  [facility] HIV SCREENING LAB  Result Value Ref Range   [facility] HIV Screening Negative - Patient reported       Assessment & Plan:   Problem List Items Addressed This Visit      Cardiovascular and Mediastinum   Hypertension    Under good control. Call with any concerns. Checking microalbumin.       Relevant Orders   Microalbumin, Urine Waived    Other Visit Diagnoses    Diarrhea, unspecified type    -  Primary   Likely IBS, but will check labs and stool studies. Await results. Call with any concerns. Diet and information given today.   Relevant Orders   Bayer DCA Hb A1c Waived   CBC with Differential/Platelet   Comprehensive metabolic panel   Celiac Panel   TSH   H. pylori antibody, IgG([facility])   Stool C-Diff Toxin Assay   Ova and parasite examination   Fecal leukocytes   Stool Culture   Generalized abdominal pain       Checking labs today. Await results. Call with any concerns.    Relevant Orders   UA/M w/rflx Culture, Routine   Blurred vision       Follow up with eye doctor on Thursday at appointment.        Follow up plan: Return if symptoms worsen or fail to improve.

## 2017-08-01 NOTE — Assessment & Plan Note (Signed)
Under good control. Call with any concerns. Checking microalbumin.

## 2017-08-01 NOTE — Patient Instructions (Addendum)
Irritable Bowel Syndrome, Adult Irritable bowel syndrome (IBS) is not one specific disease. It is a group of symptoms that affects the organs responsible for digestion (gastrointestinal or GI tract). To regulate how your GI tract works, your body sends signals back and forth between your intestines and your brain. If you have IBS, there may be a problem with these signals. As a result, your GI tract does not function normally. Your intestines may become more sensitive and overreact to certain things. This is especially true when you eat certain foods or when you are under stress. There are four types of IBS. These may be determined based on the consistency of your stool:  IBS with diarrhea.  IBS with constipation.  Mixed IBS.  Unsubtyped IBS.  It is important to know which type of IBS you have. Some treatments are more likely to be helpful for certain types of IBS. What are the causes? The exact cause of IBS is not known. What increases the risk? You may have a higher risk of IBS if:  You are a woman.  You are younger than 45 years old.  You have a family history of IBS.  You have mental health problems.  You have had bacterial infection of your GI tract.  What are the signs or symptoms? Symptoms of IBS vary from person to person. The main symptom is abdominal pain or discomfort. Additional symptoms usually include one or more of the following:  Diarrhea, constipation, or both.  Abdominal swelling or bloating.  Feeling full or sick after eating a small or regular-size meal.  Frequent gas.  Mucus in the stool.  A feeling of having more stool left after a bowel movement.  Symptoms tend to come and go. They may be associated with stress, psychiatric conditions, or nothing at all. How is this diagnosed? There is no specific test to diagnose IBS. Your health care provider will make a diagnosis based on a physical exam, medical history, and your symptoms. You may have other  tests to rule out other conditions that may be causing your symptoms. These may include:  Blood tests.  X-rays.  CT scan.  Endoscopy and colonoscopy. This is a test in which your GI tract is viewed with a long, thin, flexible tube.  How is this treated? There is no cure for IBS, but treatment can help relieve symptoms. IBS treatment often includes:  Changes to your diet, such as: ? Eating more fiber. ? Avoiding foods that cause symptoms. ? Drinking more water. ? Eating regular, medium-sized portioned meals.  Medicines. These may include: ? Fiber supplements if you have constipation. ? Medicine to control diarrhea (antidiarrheal medicines). ? Medicine to help control muscle spasms in your GI tract (antispasmodic medicines). ? Medicines to help with any mental health issues, such as antidepressants or tranquilizers.  Therapy. ? Talk therapy may help with anxiety, depression, or other mental health issues that can make IBS symptoms worse.  Stress reduction. ? Managing your stress can help keep symptoms under control.  Follow these instructions at home:  Take medicines only as directed by your health care provider.  Eat a healthy diet. ? Avoid foods and drinks with added sugar. ? Include more whole grains, fruits, and vegetables gradually into your diet. This may be especially helpful if you have IBS with constipation. ? Avoid any foods and drinks that make your symptoms worse. These may include dairy products and caffeinated or carbonated drinks. ? Do not eat large meals. ? Drink enough   fluid to keep your urine clear or pale yellow.  Exercise regularly. Ask your health care provider for recommendations of good activities for you.  Keep all follow-up visits as directed by your health care provider. This is important. Contact a health care provider if:  You have constant pain.  You have trouble or pain with swallowing.  You have worsening diarrhea. Get help right away  if:  You have severe and worsening abdominal pain.  You have diarrhea and: ? You have a rash, stiff neck, or severe headache. ? You are irritable, sleepy, or difficult to awaken. ? You are weak, dizzy, or extremely thirsty.  You have bright red blood in your stool or you have black tarry stools.  You have unusual abdominal swelling that is painful.  You vomit continuously.  You vomit blood (hematemesis).  You have both abdominal pain and a fever. This information is not intended to replace advice given to you by your health care provider. Make sure you discuss any questions you have with your health care provider. Document Released: 06/13/2005 Document Revised: 11/13/2015 Document Reviewed: 02/28/2014 Elsevier Interactive Patient Education  2018 Elsevier Inc. Diet for Irritable Bowel Syndrome When you have irritable bowel syndrome (IBS), the foods you eat and your eating habits are very important. IBS may cause various symptoms, such as abdominal pain, constipation, or diarrhea. Choosing the right foods can help ease discomfort caused by these symptoms. Work with your health care provider and dietitian to find the best eating plan to help control your symptoms. What general guidelines do I need to follow?  Keep a food diary. This will help you identify foods that cause symptoms. Write down: ? What you eat and when. ? What symptoms you have. ? When symptoms occur in relation to your meals.  Avoid foods that cause symptoms. Talk with your dietitian about other ways to get the same nutrients that are in these foods.  Eat more foods that contain fiber. Take a fiber supplement if directed by your dietitian.  Eat your meals slowly, in a relaxed setting.  Aim to eat 5-6 small meals per day. Do not skip meals.  Drink enough fluids to keep your urine clear or pale yellow.  Ask your health care provider if you should take an over-the-counter probiotic during flare-ups to help restore  healthy gut bacteria.  If you have cramping or diarrhea, try making your meals low in fat and high in carbohydrates. Examples of carbohydrates are pasta, rice, whole grain breads and cereals, fruits, and vegetables.  If dairy products cause your symptoms to flare up, try eating less of them. You might be able to handle yogurt better than other dairy products because it contains bacteria that help with digestion. What foods are not recommended? The following are some foods and drinks that may worsen your symptoms:  Fatty foods, such as French fries.  Milk products, such as cheese or ice cream.  Chocolate.  Alcohol.  Products with caffeine, such as coffee.  Carbonated drinks, such as soda.  The items listed above may not be a complete list of foods and beverages to avoid. Contact your dietitian for more information. What foods are good sources of fiber? Your health care provider or dietitian may recommend that you eat more foods that contain fiber. Fiber can help reduce constipation and other IBS symptoms. Add foods with fiber to your diet a little at a time so that your body can get used to them. Too much fiber at once   might cause gas and swelling of your abdomen. The following are some foods that are good sources of fiber:  Apples.  Peaches.  Pears.  Berries.  Figs.  Broccoli (raw).  Cabbage.  Carrots.  Raw peas.  Kidney beans.  Lima beans.  Whole grain bread.  Whole grain cereal.  Where to find more information: International Foundation for Functional Gastrointestinal Disorders: www.iffgd.org National Institute of Diabetes and Digestive and Kidney Diseases: www.niddk.nih.gov/health-information/health-topics/digestive-diseases/ibs/Pages/facts.aspx This information is not intended to replace advice given to you by your health care provider. Make sure you discuss any questions you have with your health care provider. Document Released: 09/03/2003 Document Revised:  11/19/2015 Document Reviewed: 09/13/2013 Elsevier Interactive Patient Education  2018 Elsevier Inc.  

## 2017-08-02 ENCOUNTER — Telehealth: Payer: Self-pay | Admitting: Family Medicine

## 2017-08-02 ENCOUNTER — Other Ambulatory Visit: Payer: Self-pay | Admitting: Family Medicine

## 2017-08-02 DIAGNOSIS — R197 Diarrhea, unspecified: Secondary | ICD-10-CM | POA: Diagnosis not present

## 2017-08-02 DIAGNOSIS — K529 Noninfective gastroenteritis and colitis, unspecified: Secondary | ICD-10-CM | POA: Diagnosis not present

## 2017-08-02 DIAGNOSIS — A048 Other specified bacterial intestinal infections: Secondary | ICD-10-CM

## 2017-08-02 LAB — UA/M W/RFLX CULTURE, ROUTINE
BILIRUBIN UA: NEGATIVE
GLUCOSE, UA: NEGATIVE
Leukocytes, UA: NEGATIVE
NITRITE UA: NEGATIVE
Protein, UA: NEGATIVE
RBC UA: NEGATIVE
Specific Gravity, UA: 1.025 (ref 1.005–1.030)
UUROB: 0.2 mg/dL (ref 0.2–1.0)
pH, UA: 6 (ref 5.0–7.5)

## 2017-08-02 LAB — CBC WITH DIFFERENTIAL/PLATELET
BASOS ABS: 0 10*3/uL (ref 0.0–0.2)
Basos: 0 %
EOS (ABSOLUTE): 0 10*3/uL (ref 0.0–0.4)
Eos: 1 %
HEMOGLOBIN: 13.8 g/dL (ref 13.0–17.7)
Hematocrit: 41.3 % (ref 37.5–51.0)
IMMATURE GRANS (ABS): 0 10*3/uL (ref 0.0–0.1)
Immature Granulocytes: 0 %
LYMPHS: 39 %
Lymphocytes Absolute: 2.8 10*3/uL (ref 0.7–3.1)
MCH: 27.8 pg (ref 26.6–33.0)
MCHC: 33.4 g/dL (ref 31.5–35.7)
MCV: 83 fL (ref 79–97)
MONOCYTES: 7 %
Monocytes Absolute: 0.5 10*3/uL (ref 0.1–0.9)
NEUTROS PCT: 53 %
Neutrophils Absolute: 3.9 10*3/uL (ref 1.4–7.0)
PLATELETS: 239 10*3/uL (ref 150–379)
RBC: 4.96 x10E6/uL (ref 4.14–5.80)
RDW: 14.5 % (ref 12.3–15.4)
WBC: 7.3 10*3/uL (ref 3.4–10.8)

## 2017-08-02 LAB — GLIA (IGA/G) + TTG IGA
ANTIGLIADIN ABS, IGA: 5 U (ref 0–19)
Gliadin IgG: 5 units (ref 0–19)

## 2017-08-02 LAB — COMPREHENSIVE METABOLIC PANEL
ALBUMIN: 4.5 g/dL (ref 3.5–5.5)
ALT: 22 IU/L (ref 0–44)
AST: 30 IU/L (ref 0–40)
Albumin/Globulin Ratio: 1.8 (ref 1.2–2.2)
Alkaline Phosphatase: 54 IU/L (ref 39–117)
BILIRUBIN TOTAL: 0.3 mg/dL (ref 0.0–1.2)
BUN/Creatinine Ratio: 10 (ref 9–20)
BUN: 11 mg/dL (ref 6–24)
CALCIUM: 9.1 mg/dL (ref 8.7–10.2)
CHLORIDE: 105 mmol/L (ref 96–106)
CO2: 23 mmol/L (ref 20–29)
CREATININE: 1.07 mg/dL (ref 0.76–1.27)
GFR calc non Af Amer: 83 mL/min/{1.73_m2} (ref >59)
GFR, EST AFRICAN AMERICAN: 96 mL/min/{1.73_m2} (ref >59)
Globulin, Total: 2.5 g/dL (ref 1.5–4.5)
Glucose: 107 mg/dL — ABNORMAL HIGH (ref 65–99)
Potassium: 4.5 mmol/L (ref 3.5–5.2)
Sodium: 142 mmol/L (ref 134–144)
TOTAL PROTEIN: 7 g/dL (ref 6.0–8.5)

## 2017-08-02 LAB — BAYER DCA HB A1C WAIVED: HB A1C (BAYER DCA - WAIVED): 5 % (ref <7.0)

## 2017-08-02 LAB — H. PYLORI ANTIBODY, IGG: H. pylori, IgG AbS: 6.63 Index Value — ABNORMAL HIGH (ref 0.00–0.79)

## 2017-08-02 LAB — MICROALBUMIN, URINE WAIVED
CREATININE, URINE WAIVED: 200 mg/dL (ref 10–300)
MICROALB, UR WAIVED: 30 mg/L — AB (ref 0–19)
Microalb/Creat Ratio: <30 mg/g (ref <30)

## 2017-08-02 LAB — TSH: TSH: 1.45 u[IU]/mL (ref 0.450–4.500)

## 2017-08-02 MED ORDER — METRONIDAZOLE 500 MG PO TABS: 500.0000 mg | ORAL_TABLET | Freq: Two times a day (BID) | ORAL | 0 refills | Status: DC

## 2017-08-02 MED ORDER — OMEPRAZOLE 20 MG PO CPDR: 20.0000 mg | DELAYED_RELEASE_CAPSULE | Freq: Every day | ORAL | 1 refills | Status: DC

## 2017-08-02 MED ORDER — CLARITHROMYCIN 500 MG PO TABS: 500.0000 mg | ORAL_TABLET | Freq: Two times a day (BID) | ORAL | 0 refills | Status: DC

## 2017-08-02 NOTE — Telephone Encounter (Signed)
Copied from CRM [id number]. Topic: Quick Communication - See Telephone Encounter >> Aug 02, 2017 10:05 AM Landry MellowFoltz, Melissa J wrote: CRM for notification. See Telephone encounter for:   08/02/17. Pt feels that he has a bacterial infection and would like abx in.  He is going out of town this weekend, and uses [facility].  Cb# is [phone]

## 2017-08-02 NOTE — Telephone Encounter (Signed)
Left message for pt. To call back and schedule an office visit and to discuss his symptoms.

## 2017-08-02 NOTE — Telephone Encounter (Signed)
He has to drop off the stool samples to see if he has an infection or not. If it is viral, it doesn't get better with an antibiotic and the antibiotic can make it worse. I'm still waiting on most of his labs, but his blood count was normal which makes it less likely that he has a bacterial infection.

## 2017-08-02 NOTE — Telephone Encounter (Signed)
Spoke with patient, he had already done his own research.

## 2017-08-02 NOTE — Telephone Encounter (Signed)
Patient notified

## 2017-08-02 NOTE — Telephone Encounter (Signed)
Pt states he was seen yesterday, and hoping Dr Laural BenesJohnson can call in a Rx for him. The message pt received did not make sense since he was just seen. Pt states he is still having the constant diarrhea.   Would like to be treated with an abx.    [facility] - [place] [place] - [address] [phone] (Phone) [phone] (Fax)

## 2017-08-02 NOTE — Addendum Note (Signed)
Addended by: Bascom LevelsPERRY, Derionna Salvador R on: 08/02/2017 08:48 AM   Modules accepted: Orders

## 2017-08-02 NOTE — Telephone Encounter (Signed)
Pt calling and wanted to know what could cause him to get a pylori infection? Please advise

## 2017-08-02 NOTE — Telephone Encounter (Signed)
Please also let him know that his h. Pylori just came back and he is positive for it. This can cause ulcers in his stomach. We will start the eradication of his h. Pylori, which is 2 antibiotics and an acid blocker. It is important that he completes the entire course of this medicine, as untreated, h. Pylori can cause stomach cancer. Please have him hold his atorvastatin while he's on this medicine, and we'll recheck him through a stool sample when he's done with the treatment.   Please also make sure he drops off the stool studies.

## 2017-08-03 DIAGNOSIS — H35713 Central serous chorioretinopathy, bilateral: Secondary | ICD-10-CM | POA: Diagnosis not present

## 2017-08-03 DIAGNOSIS — H35721 Serous detachment of retinal pigment epithelium, right eye: Secondary | ICD-10-CM | POA: Diagnosis not present

## 2017-08-04 ENCOUNTER — Telehealth: Payer: Self-pay | Admitting: Family Medicine

## 2017-08-04 LAB — CLOSTRIDIUM DIFFICILE EIA: C DIFFICILE TOXINS A+ B, EIA: NEGATIVE

## 2017-08-04 LAB — OVA AND PARASITE EXAMINATION

## 2017-08-04 NOTE — Telephone Encounter (Signed)
Please let him know that his c. Diff was negative- still waiting on the rest of his stool studies.

## 2017-08-04 NOTE — Telephone Encounter (Signed)
Patient notified

## 2017-08-04 NOTE — Telephone Encounter (Signed)
Please let him know that his stool was negative for bacteria and parasites. Thanks!

## 2017-08-07 ENCOUNTER — Telehealth: Payer: Self-pay | Admitting: Family Medicine

## 2017-08-07 LAB — STOOL CULTURE: E COLI SHIGA TOXIN ASSAY: NEGATIVE

## 2017-08-07 NOTE — Telephone Encounter (Signed)
Patient notified

## 2017-08-07 NOTE — Telephone Encounter (Signed)
Please let him know that all his stool tests have come back normal. Thanks!

## 2017-08-10 LAB — FECAL LEUKOCYTES

## 2017-08-22 ENCOUNTER — Ambulatory Visit: Payer: BLUE CROSS/BLUE SHIELD | Admitting: Family Medicine

## 2017-08-22 ENCOUNTER — Encounter: Payer: Self-pay | Admitting: Family Medicine

## 2017-08-22 VITALS — BP 125/81 | HR 85 | Temp 97.5°F | Wt 145.6 lb

## 2017-08-22 DIAGNOSIS — A048 Other specified bacterial intestinal infections: Secondary | ICD-10-CM

## 2017-08-22 DIAGNOSIS — F321 Major depressive disorder, single episode, moderate: Secondary | ICD-10-CM | POA: Diagnosis not present

## 2017-08-22 DIAGNOSIS — G4700 Insomnia, unspecified: Secondary | ICD-10-CM

## 2017-08-22 MED ORDER — CLONAZEPAM 0.5 MG PO TABS: 0.5000 mg | ORAL_TABLET | Freq: Every evening | ORAL | 1 refills | Status: DC | PRN

## 2017-08-22 NOTE — Progress Notes (Signed)
BP 125/81 (BP Location: Left Arm, Patient Position: Sitting, Cuff Size: Normal)   Pulse 85   Temp (!) 97.5 F (36.4 C)   Wt 145 lb 9 oz (66 kg)   SpO2 99%   BMI 22.80 kg/m    Subjective:    Patient ID: Shawn Ware, male    DOB: 12/29/1970, 47 y.o.   MRN: [id number]  HPI: Shawn HarveyDarius Marqui Kitt is a 47 y.o. male  Chief Complaint  Patient presents with  . Anxiety   Finishing his medicine for h. Pylori- has another week before he's done, then will check stool to confirm eradication. Feeling better from that. Still having loose stools.    Loses his insurance in the next couple of days- transferring to a new job.   INSOMNIA- taking the belsomra most nights, working 3/5 nights, will occasionally take a klonopin with that- usually about 1x every 2 weeks Duration: chronic Satisfied with sleep quality: yes Difficulty falling asleep: yes Difficulty staying asleep: yes Waking a few hours after sleep onset: no Early morning awakenings: no Daytime hypersomnolence: no Wakes feeling refreshed: yes Good sleep hygiene: yes Apnea: no Snoring: no Depressed/anxious mood: no Recent stress: no Restless legs/nocturnal leg cramps: no Chronic pain/arthritis: no History of sleep study: no Treatments attempted: melatonin, uinsom, benadryl and ambien   Relevant past medical, surgical, family and social history reviewed and updated as indicated. Interim medical history since our last visit reviewed. Allergies and medications reviewed and updated.  Review of Systems  Constitutional: Negative.   Respiratory: Negative.   Cardiovascular: Negative.   Gastrointestinal: Positive for diarrhea. Negative for abdominal distention, abdominal pain, anal bleeding, blood in stool, constipation, nausea, rectal pain and vomiting.  Musculoskeletal: Negative.   Psychiatric/Behavioral: Positive for sleep disturbance. Negative for agitation, behavioral problems, confusion, decreased concentration, dysphoric  mood, hallucinations, self-injury and suicidal ideas. The patient is not nervous/anxious and is not hyperactive.     Per HPI unless specifically indicated above     Objective:    BP 125/81 (BP Location: Left Arm, Patient Position: Sitting, Cuff Size: Normal)   Pulse 85   Temp (!) 97.5 F (36.4 C)   Wt 145 lb 9 oz (66 kg)   SpO2 99%   BMI 22.80 kg/m   Wt Readings from Last 3 Encounters:  08/22/17 145 lb 9 oz (66 kg)  08/01/17 147 lb 5 oz (66.8 kg)  07/06/17 148 lb 4 oz (67.2 kg)    Physical Exam  Constitutional: He is oriented to person, place, and time. He appears well-developed and well-nourished. No distress.  HENT:  Head: Normocephalic and atraumatic.  Right Ear: Hearing normal.  Left Ear: Hearing normal.  Nose: Nose normal.  Eyes: Conjunctivae and lids are normal. Right eye exhibits no discharge. Left eye exhibits no discharge. No scleral icterus.  Cardiovascular: Normal rate, regular rhythm, normal heart sounds and intact distal pulses. Exam reveals no gallop and no friction rub.  No murmur heard. Pulmonary/Chest: Effort normal and breath sounds normal. No respiratory distress. He has no wheezes. He has no rales. He exhibits no tenderness.  Musculoskeletal: Normal range of motion.  Neurological: He is alert and oriented to person, place, and time.  Skin: Skin is warm, dry and intact. No rash noted. He is not diaphoretic. No erythema. No pallor.  Psychiatric: He has a normal mood and affect. His speech is normal and behavior is normal. Judgment and thought content normal. Cognition and memory are normal.  Nursing note and vitals reviewed.  Results for orders placed or performed in visit on 08/02/17  Fecal leukocytes  Result Value Ref Range   White Blood Cells (WBC), Stool Final report None Seen   Comment Comment       Assessment & Plan:   Problem List Items Addressed This Visit      Other   Depression, major, single episode, moderate (HCC)    Resolved. Feeling  well. Continue to monitor.       Insomnia    Doing well on the belsomra. With klonopin about 1x every 2 weeks. Continue current regimen. Continue to monitor. Call with any concerns. Refill of klonopin given today- should last at least 6 months.       H. pylori infection - Primary    Completing treatment. Will check stool in about a month. Call with any concerns.           Follow up plan: Return in about 4 weeks (around 09/19/2017) for recheck h pylori.

## 2017-08-22 NOTE — Assessment & Plan Note (Signed)
Completing treatment. Will check stool in about a month. Call with any concerns.

## 2017-08-22 NOTE — Assessment & Plan Note (Signed)
Doing well on the belsomra. With klonopin about 1x every 2 weeks. Continue current regimen. Continue to monitor. Call with any concerns. Refill of klonopin given today- should last at least 6 months.

## 2017-08-22 NOTE — Assessment & Plan Note (Signed)
Resolved.  Feeling well.  Continue to monitor.

## 2017-08-23 ENCOUNTER — Other Ambulatory Visit: Payer: Self-pay | Admitting: Family Medicine

## 2017-09-21 ENCOUNTER — Ambulatory Visit: Payer: BLUE CROSS/BLUE SHIELD | Admitting: Family Medicine

## 2017-09-21 ENCOUNTER — Telehealth: Payer: Self-pay | Admitting: Family Medicine

## 2017-09-21 DIAGNOSIS — A048 Other specified bacterial intestinal infections: Secondary | ICD-10-CM

## 2017-09-21 NOTE — Telephone Encounter (Signed)
Can you find out what questions he has?

## 2017-09-21 NOTE — Telephone Encounter (Signed)
Called and spoke with patient:  Patient states that he has completed all the medications, but he still feels like he has some of the same kind of symptoms, can he still have the bacteria?

## 2017-09-21 NOTE — Telephone Encounter (Signed)
He needs to do the repeat stool study- if he hasn't had it done, he needs to come pick up the stool kit.

## 2017-09-21 NOTE — Telephone Encounter (Signed)
Order in.

## 2017-09-21 NOTE — Telephone Encounter (Signed)
Copied from CRM [id number]. Topic: Inquiry >> Sep 21, 2017  8:10 AM Crist InfanteHarrald, Kathy J wrote: Reason for CRM: pt would like Dr Laural BenesJohnson nurse to call him back.  Pt states he has some questions about his h-pylori dx.   Also, pt had to reschedule his appt this am at 8:30. pt states his work called last night and he had to leave on emergency call.  Pt rescheduled for Monday 4/01

## 2017-09-21 NOTE — Telephone Encounter (Signed)
Patient has not done repeat stool samples, he will come by and pick the stool kit up. Please place the orders.

## 2017-09-24 NOTE — Progress Notes (Deleted)
   There were no vitals taken for this visit.   Subjective:    Patient ID: Shawn Ware, male    DOB: 01-12-1971, 47 y.o.   MRN: [id number]  HPI: Deante Blough is a 47 y.o. male  No chief complaint on file.  Here today for recheck on his h pylori.   Relevant past medical, surgical, family and social history reviewed and updated as indicated. Interim medical history since our last visit reviewed. Allergies and medications reviewed and updated.  Review of Systems  Per HPI unless specifically indicated above     Objective:    There were no vitals taken for this visit.  Wt Readings from Last 3 Encounters:  08/22/17 145 lb 9 oz (66 kg)  08/01/17 147 lb 5 oz (66.8 kg)  07/06/17 148 lb 4 oz (67.2 kg)    Physical Exam  Results for orders placed or performed in visit on 08/02/17  Fecal leukocytes  Result Value Ref Range   White Blood Cells (WBC), Stool Final report None Seen   Comment Comment       Assessment & Plan:   Problem List Items Addressed This Visit    None       Follow up plan: No follow-ups on file.

## 2017-09-25 ENCOUNTER — Ambulatory Visit: Payer: Self-pay | Admitting: Family Medicine

## 2017-10-02 ENCOUNTER — Ambulatory Visit: Payer: Self-pay | Admitting: Unknown Physician Specialty

## 2017-10-02 ENCOUNTER — Ambulatory Visit: Payer: Self-pay | Admitting: Family Medicine

## 2017-10-13 ENCOUNTER — Encounter: Payer: Self-pay | Admitting: Family Medicine

## 2017-10-13 ENCOUNTER — Ambulatory Visit: Payer: Self-pay | Admitting: Family Medicine

## 2017-10-13 VITALS — BP 123/80 | HR 81 | Temp 98.0°F | Wt 147.1 lb

## 2017-10-13 DIAGNOSIS — Z111 Encounter for screening for respiratory tuberculosis: Secondary | ICD-10-CM | POA: Diagnosis not present

## 2017-10-13 DIAGNOSIS — L739 Follicular disorder, unspecified: Secondary | ICD-10-CM

## 2017-10-13 DIAGNOSIS — A048 Other specified bacterial intestinal infections: Secondary | ICD-10-CM

## 2017-10-13 DIAGNOSIS — Z021 Encounter for pre-employment examination: Secondary | ICD-10-CM

## 2017-10-13 DIAGNOSIS — R197 Diarrhea, unspecified: Secondary | ICD-10-CM

## 2017-10-13 MED ORDER — MUPIROCIN 2 % EX OINT: 1.0000 "application " | TOPICAL_OINTMENT | Freq: Two times a day (BID) | CUTANEOUS | 0 refills | Status: DC

## 2017-10-13 NOTE — Assessment & Plan Note (Signed)
Has not had repeat stool test to confirm eradication. Stool brought in today. Await results.

## 2017-10-13 NOTE — Patient Instructions (Signed)
Folliculitis Folliculitis is inflammation of the hair follicles. Folliculitis most commonly occurs on the scalp, thighs, legs, back, and buttocks. However, it can occur anywhere on the body. What are the causes? This condition may be caused by:  A bacterial infection (common).  A fungal infection.  A viral infection.  Coming into contact with certain chemicals, especially oils and tars.  Shaving or waxing.  Applying greasy ointments or creams to your skin often.  Long-lasting folliculitis and folliculitis that keeps coming back can be caused by bacteria that live in the nostrils. What increases the risk? This condition is more likely to develop in people with:  A weakened immune system.  Diabetes.  Obesity.  What are the signs or symptoms? Symptoms of this condition include:  Redness.  Soreness.  Swelling.  Itching.  Small white or yellow, pus-filled, itchy spots (pustules) that appear over a reddened area. If there is an infection that goes deep into the follicle, these may develop into a boil (furuncle).  A group of closely packed boils (carbuncle). These tend to form in hairy, sweaty areas of the body.  How is this diagnosed? This condition is diagnosed with a skin exam. To find what is causing the condition, your health care provider may take a sample of one of the pustules or boils for testing. How is this treated? This condition may be treated by:  Applying warm compresses to the affected areas.  Taking an antibiotic medicine or applying an antibiotic medicine to the skin.  Applying or bathing with an antiseptic solution.  Taking an over-the-counter medicine to help with itching.  Having a procedure to drain any pustules or boils. This may be done if a pustule or boil contains a lot of pus or fluid.  Laser hair removal. This may be done to treat long-lasting folliculitis.  Follow these instructions at home:  If directed, apply heat to the affected  area as often as told by your health care provider. Use the heat source that your health care provider recommends, such as a moist heat pack or a heating pad. ? Place a towel between your skin and the heat source. ? Leave the heat on for 20-30 minutes. ? Remove the heat if your skin turns bright red. This is especially important if you are unable to feel pain, heat, or cold. You may have a greater risk of getting burned.  If you were prescribed an antibiotic medicine, use it as told by your health care provider. Do not stop using the antibiotic even if you start to feel better.  Take over-the-counter and prescription medicines only as told by your health care provider.  Do not shave irritated skin.  Keep all follow-up visits as told by your health care provider. This is important. Get help right away if:  You have more redness, swelling, or pain in the affected area.  Red streaks are spreading from the affected area.  You have a fever. This information is not intended to replace advice given to you by your health care provider. Make sure you discuss any questions you have with your health care provider. Document Released: 08/22/2001 Document Revised: 01/01/2016 Document Reviewed: 04/03/2015 Elsevier Interactive Patient Education  2018 [employer]. Diet for Irritable Bowel Syndrome When you have irritable bowel syndrome (IBS), the foods you eat and your eating habits are very important. IBS may cause various symptoms, such as abdominal pain, constipation, or diarrhea. Choosing the right foods can help ease discomfort caused by these symptoms. Work  with your health care provider and dietitian to find the best eating plan to help control your symptoms. What general guidelines do I need to follow?  Keep a food diary. This will help you identify foods that cause symptoms. Write down: ? What you eat and when. ? What symptoms you have. ? When symptoms occur in relation to your meals.  Avoid  foods that cause symptoms. Talk with your dietitian about other ways to get the same nutrients that are in these foods.  Eat more foods that contain fiber. Take a fiber supplement if directed by your dietitian.  Eat your meals slowly, in a relaxed setting.  Aim to eat 5-6 small meals per day. Do not skip meals.  Drink enough fluids to keep your urine clear or pale yellow.  Ask your health care provider if you should take an over-the-counter probiotic during flare-ups to help restore healthy gut bacteria.  If you have cramping or diarrhea, try making your meals low in fat and high in carbohydrates. Examples of carbohydrates are pasta, rice, whole grain breads and cereals, fruits, and vegetables.  If dairy products cause your symptoms to flare up, try eating less of them. You might be able to handle yogurt better than other dairy products because it contains bacteria that help with digestion. What foods are not recommended? The following are some foods and drinks that may worsen your symptoms:  Fatty foods, such as French fries.  Milk products, such as cheese or ice cream.  Jamaic[place]ocolate.  Alcohol.  Products with caffeine, such as coffee.  Carbonated drinks, such as soda.  The items listed above may not be a complete list of foods and beverages to avoid. Contact your dietitian for more information. What foods are good sources of fiber? Your health care provider or dietitian may recommend that you eat more foods that contain fiber. Fiber can help reduce constipation and other IBS symptoms. Add foods with fiber to your diet a little at a time so that your body can get used to them. Too much fiber at once might cause gas and swelling of your abdomen. The following are some foods that are good sources of fiber:  Apples.  Peaches.  Pears.  Berries.  Figs.  Broccoli (raw).  Cabbage.  Carrots.  Raw peas.  Kidney beans.  Lima beans.  Whole grain bread.  Whole grain  cereal.  Where to find more information: [employer] for Functional Gastrointestinal Disorders: www.iffgd.[employer] National Institute of Diabetes and Digestive and Kidney Diseases: http://norris-lawson.com/www.niddk.nih.gov/health-information/health-topics/digestive-diseases/ibs/Pages/facts.aspx This information is not intended to replace advice given to you by your health care provider. Make sure you discuss any questions you have with your health care provider. Document Released: 09/03/2003 Document Revised: 11/19/2015 Document Reviewed: 09/13/2013 Elsevier Interactive Patient Education  2018 Elsevier Inc. Irritable Bowel Syndrome, Adult Irritable bowel syndrome (IBS) is not one specific disease. It is a group of symptoms that affects the organs responsible for digestion (gastrointestinal or GI tract). To regulate how your GI tract works, your body sends signals back and forth between your intestines and your brain. If you have IBS, there may be a problem with these signals. As a result, your GI tract does not function normally. Your intestines may become more sensitive and overreact to certain things. This is especially true when you eat certain foods or when you are under stress. There are four types of IBS. These may be determined based on the consistency of your stool:  IBS with diarrhea.  IBS with  constipation.  Mixed IBS.  Unsubtyped IBS.  It is important to know which type of IBS you have. Some treatments are more likely to be helpful for certain types of IBS. What are the causes? The exact cause of IBS is not known. What increases the risk? You may have a higher risk of IBS if:  You are a woman.  You are younger than 46 years old.  You have a family history of IBS.  You have mental health problems.  You have had bacterial infection of your GI tract.  What are the signs or symptoms? Symptoms of IBS vary from person to person. The main symptom is abdominal pain or discomfort. Additional  symptoms usually include one or more of the following:  Diarrhea, constipation, or both.  Abdominal swelling or bloating.  Feeling full or sick after eating a small or regular-size meal.  Frequent gas.  Mucus in the stool.  A feeling of having more stool left after a bowel movement.  Symptoms tend to come and go. They may be associated with stress, psychiatric conditions, or nothing at all. How is this diagnosed? There is no specific test to diagnose IBS. Your health care provider will make a diagnosis based on a physical exam, medical history, and your symptoms. You may have other tests to rule out other conditions that may be causing your symptoms. These may include:  Blood tests.  X-rays.  CT scan.  Endoscopy and colonoscopy. This is a test in which your GI tract is viewed with a long, thin, flexible tube.  How is this treated? There is no cure for IBS, but treatment can help relieve symptoms. IBS treatment often includes:  Changes to your diet, such as: ? Eating more fiber. ? Avoiding foods that cause symptoms. ? Drinking more water. ? Eating regular, medium-sized portioned meals.  Medicines. These may include: ? Fiber supplements if you have constipation. ? Medicine to control diarrhea (antidiarrheal medicines). ? Medicine to help control muscle spasms in your GI tract (antispasmodic medicines). ? Medicines to help with any mental health issues, such as antidepressants or tranquilizers.  Therapy. ? Talk therapy may help with anxiety, depression, or other mental health issues that can make IBS symptoms worse.  Stress reduction. ? Managing your stress can help keep symptoms under control.  Follow these instructions at home:  Take medicines only as directed by your health care provider.  Eat a healthy diet. ? Avoid foods and drinks with added sugar. ? Include more whole grains, fruits, and vegetables gradually into your diet. This may be especially helpful if  you have IBS with constipation. ? Avoid any foods and drinks that make your symptoms worse. These may include dairy products and caffeinated or carbonated drinks. ? Do not eat large meals. ? Drink enough fluid to keep your urine clear or pale yellow.  Exercise regularly. Ask your health care provider for recommendations of good activities for you.  Keep all follow-up visits as directed by your health care provider. This is important. Contact a health care provider if:  You have constant pain.  You have trouble or pain with swallowing.  You have worsening diarrhea. Get help right away if:  You have severe and worsening abdominal pain.  You have diarrhea and: ? You have a rash, stiff neck, or severe headache. ? You are irritable, sleepy, or difficult to awaken. ? You are weak, dizzy, or extremely thirsty.  You have bright red blood in your stool or you have black  tarry stools.  You have unusual abdominal swelling that is painful.  You vomit continuously.  You vomit blood (hematemesis).  You have both abdominal pain and a fever. This information is not intended to replace advice given to you by your health care provider. Make sure you discuss any questions you have with your health care provider. Document Released: 06/13/2005 Document Revised: 11/13/2015 Document Reviewed: 02/28/2014 Elsevier Interactive Patient Education  2018 [employer].

## 2017-10-13 NOTE — Progress Notes (Signed)
BP 123/80 (BP Location: Left Arm, Patient Position: Sitting, Cuff Size: Normal)   Pulse 81   Temp 98 F (36.7 C)   Wt 147 lb 2 oz (66.7 kg)   SpO2 98%   BMI 23.04 kg/m    Subjective:    Patient ID: Shawn Ware, male    DOB: 04-Mar-1971, 47 y.o.   MRN: [id number]  HPI: Shawn Ware is a 47 y.o. male  Chief Complaint  Patient presents with  . Diarrhea  . Immunizations    MMR,Varicella, Tdap is up to date.   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 Needs to have a quantiferon, MMR and varicella and Tdap. Will get titres on the MMR and varicella. Tdap is up to date. Quantiferon ordered today.  Relevant past medical, surgical, family and social history reviewed and updated as indicated. Interim medical history since our last visit reviewed. Allergies and medications reviewed and updated.  Review of Systems  Constitutional: Negative.   Respiratory: Negative.   Cardiovascular: Negative.   Gastrointestinal: Positive for diarrhea. Negative for abdominal distention, abdominal pain, anal bleeding, blood in stool, constipation, nausea, rectal pain and vomiting.  Genitourinary: Negative.   Musculoskeletal: Negative.   Skin: Negative.   Psychiatric/Behavioral: Negative for agitation, behavioral problems, confusion, decreased concentration, dysphoric mood, hallucinations, self-injury, sleep disturbance and suicidal ideas. The patient is nervous/anxious. The patient is not hyperactive.      Per HPI unless specifically indicated above     Objective:    BP 123/80 (BP Location: Left Arm, Patient Position: Sitting, Cuff Size: Normal)   Pulse 81   Temp 98 F (36.7 C)   Wt 147 lb 2 oz (66.7 kg)   SpO2 98%   BMI 23.04 kg/m   Wt Readings from Last 3 Encounters:  10/13/17 147 lb 2 oz (66.7 kg)  08/22/17 145 lb 9 oz (66 kg)  08/01/17 147 lb 5 oz (66.8 kg)    Physical Exam  Constitutional: He is oriented to person, place, and time. He appears well-developed and well-nourished. No distress.  HENT:  Head: Normocephalic and atraumatic.  Right Ear: Hearing normal.  Left Ear: Hearing normal.  Nose: Nose normal.  Eyes: Conjunctivae and lids are normal. Right eye exhibits no discharge. Left eye exhibits no discharge. No scleral icterus.  Cardiovascular: Normal rate, regular rhythm, normal heart sounds and intact distal pulses. Exam reveals no gallop and no friction rub.  No murmur heard. Pulmonary/Chest: Effort normal and breath sounds normal. No stridor. No respiratory distress. He has no wheezes. He has no rales. He exhibits no tenderness.  Abdominal: Soft. Bowel sounds are normal. He exhibits no distension and no mass. There is no tenderness. There is no rebound and no guarding. No hernia.  Musculoskeletal: Normal range of motion.  Neurological: He is alert and oriented to person, place, and time.  Skin: Skin is warm, dry and intact. Capillary refill takes less than 2 seconds. Rash (well healing rash on R lower belly) noted. He is not diaphoretic. No erythema. No pallor.  Psychiatric: He has a  normal mood and affect. His speech is normal and behavior is normal. Judgment and thought content normal. Cognition and memory are normal.  Nursing note and vitals reviewed.   Results for orders placed or performed in visit on 08/02/17  Fecal leukocytes  Result Value Ref Range   White Blood Cells (WBC), Stool Final report None Seen   Comment Comment       Assessment & Plan:    Problem List Items Addressed This Visit      Other   H. pylori infection    Has not had repeat stool test to confirm eradication. Stool brought in today. Await results.       Relevant Medications   mupirocin ointment (BACTROBAN) 2 %    Other Visit Diagnoses    Diarrhea, unspecified type    -  Primary   Likely IBS related. Has had stool studies and blood work previously. Was treated for h. pylori. Will get him into GI for evaluation given chronicity.   Relevant Orders   Ambulatory referral to Gastroenterology   Screening for tuberculosis       Labs drawn today. Await results.    Relevant Orders   QuantiFERON-TB Gold Plus,4 Tubes,Draw Site Incubated([facility])   Pre-employment examination       Titers on his MMR-V and his quantiferon drawn today. Await results. Will vaccinate as needed.    Relevant Orders   Measles/Mumps/Rubella Immunity   Varicella Zoster Abs, IgG/IgM   Folliculitis       Will treat with bactroban. Call with any concerns or if not getting better.       Follow up plan: Return if symptoms worsen or fail to improve.

## 2017-10-15 LAB — H. PYLORI ANTIGEN, STOOL: H PYLORI AG STL: NEGATIVE

## 2017-10-16 ENCOUNTER — Telehealth: Payer: Self-pay | Admitting: Family Medicine

## 2017-10-16 DIAGNOSIS — Z23 Encounter for immunization: Secondary | ICD-10-CM

## 2017-10-16 NOTE — Telephone Encounter (Signed)
Please let him know that his h. Pylori has been eradicated. He is immune to rubella and chicken pox, but he is not immune to measles or mumps, so will need to come in to get revaccinated. Still waiting on his TB test. Order in.

## 2017-10-16 NOTE — Telephone Encounter (Signed)
Called patient, no answer, left a message for patient to call back to get lab results.   Ok for nurse triage to give results.

## 2017-10-16 NOTE — Telephone Encounter (Signed)
Copied from CRM [id number]. Topic: Quick Communication - Lab Results >> Oct 16, 2017 11:31 AM Percival SpanishKennedy, Cheryl W wrote:  Pt call back for lab results Triage line busy     [phone]

## 2017-10-17 LAB — VARICELLA ZOSTER ABS, IGG/IGM
Varicella IgM: <0.91 index (ref 0.00–0.90)
Varicella zoster IgG: 1592 index (ref >165)

## 2017-10-17 LAB — MEASLES/MUMPS/RUBELLA IMMUNITY
MUMPS ABS, IGG: <9 AU/mL — ABNORMAL LOW (ref >10.9)
RUBELLA: 2.27 {index} (ref >0.99)
RUBEOLA AB, IGG: <25 AU/mL — ABNORMAL LOW (ref >29.9)

## 2017-10-17 NOTE — Telephone Encounter (Signed)
Pt. Given results and instructions. Appointment for nurse and MMR scheduled.

## 2017-10-19 ENCOUNTER — Ambulatory Visit (INDEPENDENT_AMBULATORY_CARE_PROVIDER_SITE_OTHER): Payer: Self-pay

## 2017-10-19 ENCOUNTER — Encounter: Payer: Self-pay | Admitting: Gastroenterology

## 2017-10-19 DIAGNOSIS — Z23 Encounter for immunization: Secondary | ICD-10-CM

## 2017-10-19 LAB — QUANTIFERON-TB GOLD PLUS
QUANTIFERON TB1 AG VALUE: 0.02 [IU]/mL
QuantiFERON Mitogen Value: >10 IU/mL
QuantiFERON Nil Value: 0.02 IU/mL
QuantiFERON TB2 Ag Value: 0.02 IU/mL
QuantiFERON-TB Gold Plus: NEGATIVE

## 2017-10-23 ENCOUNTER — Encounter: Payer: Self-pay | Admitting: Family Medicine

## 2017-10-31 ENCOUNTER — Telehealth: Payer: Self-pay | Admitting: Family Medicine

## 2017-10-31 DIAGNOSIS — Z0184 Encounter for antibody response examination: Secondary | ICD-10-CM

## 2017-10-31 NOTE — Telephone Encounter (Signed)
Please let patient know this, and have him schedule a lab visit.

## 2017-10-31 NOTE — Telephone Encounter (Signed)
Dr.[name], do you need to see this patient for an appointment? He is done with vaccines correct?

## 2017-10-31 NOTE — Telephone Encounter (Addendum)
He has gotten the booster of his MMR- we should recheck his titre in about 6-8 weeks to make sure it sticks. (that order is in) I don't need an appointment with him. His vaccine record should show the documentation for work. Thanks!

## 2017-10-31 NOTE — Telephone Encounter (Signed)
Copied from [place]. Topic: Quick Communication - See Telephone Encounter >> Oct 31, 2017 11:46 AM Neva Seat wrote: Pt needing a call regarding his appt for Wed. 11-01-17. W/ Dr. Wynetta Emery.  He has questions regarding his MMR on 10-19-17 - only needing documentation for work records. Please call pt back to discuss if he will need the appt.

## 2017-10-31 NOTE — Telephone Encounter (Unsigned)
Copied from [place] [id number]. Topic: General - Other >> Oct 31, 2017 11:42 AM Neva Seat wrote: Pt needing a call regarding his appt for Wed. 11-01-17. W/ Dr. Wynetta Emery.  He has questions regarding his MMR on 10-19-17 - only needing documentation for work records. Please call pt back to discuss if he will need the appt.

## 2017-11-01 ENCOUNTER — Ambulatory Visit: Payer: Self-pay | Admitting: Family Medicine

## 2017-11-01 NOTE — Telephone Encounter (Signed)
Pt needs an appt for labs in 6-8 weeks

## 2017-11-01 NOTE — Telephone Encounter (Signed)
Pt called back in to follow up on paperwork. Made pt aware of PCP response. Pt would like to know if provider could give him documentation (a letter)  for his employer stating that his current MMR vaccination will suffice and is okay? Pt would like to come in to pick up as soon as possible.

## 2017-11-02 ENCOUNTER — Other Ambulatory Visit: Payer: Self-pay

## 2017-12-01 ENCOUNTER — Telehealth: Payer: Self-pay | Admitting: Family Medicine

## 2017-12-01 MED ORDER — ALPRAZOLAM 0.5 MG PO TABS: ORAL_TABLET | ORAL | 0 refills | Status: DC

## 2017-12-01 NOTE — Telephone Encounter (Signed)
Copied from CRM [id number]. Topic: Quick Communication - See Telephone Encounter >> Dec 01, 2017  8:07 AM Eston Mouldavis, Maraki Macquarrie B wrote: CRM for notification. See Telephone encounter for: 12/01/17.  PT is taking a flight on Monday  and wants to know if he can get a medication  for flight anxiety.  [place] is leaving today for [place] and wants to have it today before he leaves    [facility] - [place], [place] - [address] [phone] (Phone) [phone] (Fax)

## 2017-12-01 NOTE — Telephone Encounter (Signed)
Routing to provider  

## 2017-12-01 NOTE — Telephone Encounter (Signed)
Still unclear how many flights. Will assume direct flight and send in medication accordingly.

## 2017-12-01 NOTE — Telephone Encounter (Signed)
Please find out how many legs he has on the flight/how many flights and I can get him something.

## 2017-12-01 NOTE — Telephone Encounter (Signed)
It is just to and from the [place]. He is on the way to [place] now and needs it sent to a pharmacy.  Please send to [id number] - [place], [place] - [address]

## 2017-12-12 ENCOUNTER — Ambulatory Visit: Payer: Self-pay | Admitting: Family Medicine

## 2018-01-16 ENCOUNTER — Other Ambulatory Visit: Payer: Self-pay | Admitting: Family Medicine

## 2018-01-17 ENCOUNTER — Telehealth: Payer: Self-pay | Admitting: Family Medicine

## 2018-01-17 NOTE — Telephone Encounter (Signed)
Copied from CRM [id number]. Topic: Quick Communication - Rx Refill/Question >> Jan 17, 2018  2:50 PM [name], Shawn B wrote: Medication: clonazePAM (KLONOPIN) 0.5 MG tablet [[id number]][[id number]]   Has the patient contacted their pharmacy? Yes.   (Agent: If no, request that the patient contact the pharmacy for the refill.) (Agent: If yes, when and what did the pharmacy advise?)  Preferred Pharmacy (with phone number or street name): [facility]  Agent: Please be advised that RX refills may take up to 3 business days. We ask that you follow-up with your pharmacy.

## 2018-01-18 MED ORDER — SUVOREXANT 10 MG PO TABS: 1.0000 | ORAL_TABLET | Freq: Every evening | ORAL | 3 refills | Status: DC | PRN

## 2018-01-18 NOTE — Telephone Encounter (Signed)
Cannot have a refill until he is seen. We do not replace lost Rxs of controlled substances.

## 2018-01-18 NOTE — Telephone Encounter (Signed)
Patient notified

## 2018-01-18 NOTE — Telephone Encounter (Signed)
Please refill sleep medication, [facility]

## 2018-01-18 NOTE — Telephone Encounter (Signed)
Called and spoke with patient, he states that he is taking it every night to help with sleep and that he lost one of the prescriptions when he went to [place].

## 2018-01-18 NOTE — Telephone Encounter (Signed)
Per last visit- he was taking klonopin 1-2x a week. He was given Rx at that time which should have lasted 6 months- which is the end of August. Please find out how often he's taking it, because he's not supposed to be due yet.

## 2018-01-18 NOTE — Telephone Encounter (Signed)
patient called and stats he needs a refill of his clonazePAM (KLONOPIN) 0.5 MG tablet   He states he does not have any insurance until next month. Patient is scheduled for a med refill appt on 02/27/18. He is wondering if Dr. Laural BenesJohnson can write enough pills to last until that appt . If you have any questions please call patient   CB# [phone]  [facility] [place] - [address] [phone] (Phone) [phone] (Fax)

## 2018-01-18 NOTE — Addendum Note (Signed)
Addended by: Dorcas CarrowJOHNSON, Elisha Mcgruder P on: 01/18/2018 01:43 PM   Modules accepted: Orders

## 2018-01-18 NOTE — Telephone Encounter (Signed)
I can refill his belsomra though.

## 2018-02-06 NOTE — Telephone Encounter (Signed)
Patient notified

## 2018-02-06 NOTE — Telephone Encounter (Signed)
Pt would like to see if Dr. Laural BenesJohnson has any discount coupons available for the Suvorexant (BELSOMRA) 10 MG TABS He states that the medication is very expensive. Please advise.

## 2018-02-06 NOTE — Telephone Encounter (Signed)
Will need appointment then. Nothing else can be called in without him being seen

## 2018-02-06 NOTE — Telephone Encounter (Signed)
I misread. OK to give coupon.

## 2018-02-27 ENCOUNTER — Ambulatory Visit: Payer: Self-pay | Admitting: Family Medicine

## 2018-03-28 ENCOUNTER — Other Ambulatory Visit: Payer: Self-pay | Admitting: Family Medicine

## 2018-05-10 ENCOUNTER — Encounter: Payer: Self-pay | Admitting: Family Medicine

## 2018-05-10 ENCOUNTER — Ambulatory Visit (INDEPENDENT_AMBULATORY_CARE_PROVIDER_SITE_OTHER): Payer: Self-pay | Admitting: Family Medicine

## 2018-05-10 VITALS — BP 138/75 | HR 74 | Temp 98.6°F | Ht 67.0 in | Wt 143.8 lb

## 2018-05-10 DIAGNOSIS — K529 Noninfective gastroenteritis and colitis, unspecified: Secondary | ICD-10-CM

## 2018-05-10 DIAGNOSIS — G4700 Insomnia, unspecified: Secondary | ICD-10-CM

## 2018-05-10 DIAGNOSIS — F321 Major depressive disorder, single episode, moderate: Secondary | ICD-10-CM

## 2018-05-10 DIAGNOSIS — E782 Mixed hyperlipidemia: Secondary | ICD-10-CM

## 2018-05-10 DIAGNOSIS — I10 Essential (primary) hypertension: Secondary | ICD-10-CM

## 2018-05-10 MED ORDER — SUVOREXANT 10 MG PO TABS: 1.0000 | ORAL_TABLET | Freq: Every evening | ORAL | 5 refills | Status: DC | PRN

## 2018-05-10 MED ORDER — ONDANSETRON 4 MG PO TBDP: 4.0000 mg | ORAL_TABLET | Freq: Three times a day (TID) | ORAL | 0 refills | Status: DC | PRN

## 2018-05-10 MED ORDER — CLONAZEPAM 0.5 MG PO TABS: 0.5000 mg | ORAL_TABLET | Freq: Every evening | ORAL | 0 refills | Status: DC | PRN

## 2018-05-10 NOTE — Assessment & Plan Note (Signed)
Stable on current regimen. Refill of his clonazepam given, continues to take it 1-2x a week, 30 pills should last 6+ months. Call with any concerns.

## 2018-05-10 NOTE — Assessment & Plan Note (Signed)
Under good control on current regimen. Continue current regimen. Continue to monitor. Call with any concerns. Refills given. Will get records from work.

## 2018-05-10 NOTE — Progress Notes (Signed)
BP 138/75 (BP Location: Left Arm, Patient Position: Sitting, Cuff Size: Normal)   Pulse 74   Temp 98.6 F (37 C) (Oral)   Ht 5\' 7"  (1.702 m)   Wt 143 lb 12.8 oz (65.2 kg)   SpO2 98%   BMI 22.52 kg/m    Subjective:    Patient ID: Shawn Ware, male    DOB: 07/04/1970, 47 y.o.   MRN: [id number]  HPI: Shawn Ware is a 47 y.o. male  Chief Complaint  Patient presents with  . Hypertension  . Hyperlipidemia  . Anxiety   HYPERTENSION / HYPERLIPIDEMIA- just had something drawn at work, will get records from health screening to avoid repeating labs  Satisfied with current treatment? yes Duration of hypertension: chronic BP monitoring frequency: not checking BP medication side effects: no Past BP meds: amlodipine Duration of hyperlipidemia: chronic Cholesterol medication side effects: no Cholesterol supplements: none Past cholesterol medications: atorvastatin Medication compliance: excellent compliance Aspirin: no Recent stressors: no Recurrent headaches: no Visual changes: no Palpitations: no Dyspnea: no Chest pain: no Lower extremity edema: no Dizzy/lightheaded: no  Has been having some GI illness- nausea and vomiting for the last couple of days, starting to feel a little better. Thinks that he picked up a GI bug.  ANXIETY/STRESS Duration:stable Anxious mood: yes  Excessive worrying: yes Irritability: no  Sweating: no Nausea: no Palpitations:no Hyperventilation: no Panic attacks: no Agoraphobia: no  Obscessions/compulsions: no Depressed mood: no Depression screen [facility] 2/9 05/10/2018 10/13/2017 08/22/2017 07/06/2017 03/27/2017  Decreased Interest 0 0 0 - 0  Down, Depressed, Hopeless 0 0 0 0 1  PHQ - 2 Score 0 0 0 0 1  Altered sleeping 0 0 - 0 2  Tired, decreased energy 0 0 - 0 1  Change in appetite 0 0 - 0 0  Feeling bad or failure about yourself  0 0 - 0 0  Trouble concentrating 0 0 - 0 0  Moving slowly or fidgety/restless 0 0 - 0 0  Suicidal  thoughts 0 0 - 0 0  PHQ-9 Score 0 0 - 0 4  Difficult doing work/chores Not difficult at all Not difficult at all - - -   GAD 7 : Generalized Anxiety Score 05/10/2018 08/22/2017 06/13/2017 02/17/2017  Nervous, Anxious, on Edge 0 1 2 3   Control/stop worrying 1 1 1 3   Worry too much - different things 1 1 2 3   Trouble relaxing 1 1 0 3  Restless 0 0 0 3  Easily annoyed or irritable 1 0 1 3  Afraid - awful might happen 0 0 1 3  Total GAD 7 Score 4 4 7 21   Anxiety Difficulty Somewhat difficult Not difficult at all Not difficult at all Somewhat difficult   Anhedonia: no Weight changes: no Insomnia: not on medicine   Hypersomnia: no Fatigue/loss of energy: no Feelings of worthlessness: no Feelings of guilt: no Impaired concentration/indecisiveness: no Suicidal ideations: no  Crying spells: no Recent Stressors/Life Changes: no   Relationship problems: no   Family stress: no     Financial stress: no    Job stress: no    Recent death/loss: no  INSOMNIA- has been taking his belsomra for his sleep, it's been helping, has been doing well, keeping him asleep Duration: chronic Satisfied with sleep quality: yes- on medicine Difficulty falling asleep: yes Difficulty staying asleep: yes Waking a few hours after sleep onset: no Early morning awakenings: no Daytime hypersomnolence: no Wakes feeling refreshed: yes Good sleep hygiene: yes  Apnea: no Snoring: no Depressed/anxious mood: yes Recent stress: no Restless legs/nocturnal leg cramps: no Chronic pain/arthritis: no History of sleep study: no Treatments attempted: klonopin, melatonin, uinsom, benadryl and ambien   Relevant past medical, surgical, family and social history reviewed and updated as indicated. Interim medical history since our last visit reviewed. Allergies and medications reviewed and updated.  Review of Systems  Constitutional: Negative.   Respiratory: Negative.   Cardiovascular: Negative.   Skin: Negative.     Psychiatric/Behavioral: Positive for dysphoric mood and sleep disturbance. Negative for agitation, behavioral problems, confusion, decreased concentration, hallucinations, self-injury and suicidal ideas. The patient is nervous/anxious. The patient is not hyperactive.     Per HPI unless specifically indicated above     Objective:    BP 138/75 (BP Location: Left Arm, Patient Position: Sitting, Cuff Size: Normal)   Pulse 74   Temp 98.6 F (37 C) (Oral)   Ht 5\' 7"  (1.702 m)   Wt 143 lb 12.8 oz (65.2 kg)   SpO2 98%   BMI 22.52 kg/m   Wt Readings from Last 3 Encounters:  05/10/18 143 lb 12.8 oz (65.2 kg)  10/13/17 147 lb 2 oz (66.7 kg)  08/22/17 145 lb 9 oz (66 kg)    Physical Exam  Constitutional: He is oriented to person, place, and time. He appears well-developed and well-nourished. No distress.  HENT:  Head: Normocephalic and atraumatic.  Right Ear: Hearing normal.  Left Ear: Hearing normal.  Nose: Nose normal.  Eyes: Conjunctivae and lids are normal. Right eye exhibits no discharge. Left eye exhibits no discharge. No scleral icterus.  Cardiovascular: Normal rate, regular rhythm, normal heart sounds and intact distal pulses. Exam reveals no gallop and no friction rub.  No murmur heard. Pulmonary/Chest: Effort normal and breath sounds normal. No stridor. No respiratory distress. He has no wheezes. He has no rales. He exhibits no tenderness.  Musculoskeletal: Normal range of motion.  Neurological: He is alert and oriented to person, place, and time.  Skin: Skin is warm, dry and intact. Capillary refill takes less than 2 seconds. No rash noted. He is not diaphoretic. No erythema. No pallor.  Psychiatric: He has a normal mood and affect. His speech is normal and behavior is normal. Judgment and thought content normal. Cognition and memory are normal.  Nursing note and vitals reviewed.   Results for orders placed or performed in visit on 10/13/17  H. pylori antigen, stool  Result  Value Ref Range   H pylori Ag, Stl Negative Negative  Measles/Mumps/Rubella Immunity  Result Value Ref Range   Rubella Antibodies, IGG 2.27 Immune >0.99 index   RUBEOLA AB, IGG <25.0 (L) Immune >29.9 AU/mL   MUMPS ABS, IGG <9.0 (L) Immune >10.9 AU/mL  Varicella Zoster Abs, IgG/IgM  Result Value Ref Range   Varicella zoster IgG 1,592 Immune >165 index   Varicella IgM <0.91 0.00 - 0.90 index  QuantiFERON-TB Gold Plus  Result Value Ref Range   QuantiFERON Incubation Incubation performed.    QuantiFERON Criteria Comment    QuantiFERON TB1 Ag Value 0.02 IU/mL   QuantiFERON TB2 Ag Value 0.02 IU/mL   QuantiFERON Nil Value 0.02 IU/mL   QuantiFERON Mitogen Value >10.00 IU/mL   QuantiFERON-TB Gold Plus Negative Negative      Assessment & Plan:   Problem List Items Addressed This Visit      Cardiovascular and Mediastinum   Hypertension - Primary    Under good control on current regimen. Continue current regimen. Continue to monitor.  Call with any concerns. Refills given. BMP checked today       Relevant Orders   Basic Metabolic Panel (BMET)     Other   Hyperlipidemia    Under good control on current regimen. Continue current regimen. Continue to monitor. Call with any concerns. Refills given. Will get records from work.        Depression, major, single episode, moderate (HCC)    Stable on current regimen. Refill of his clonazepam given, continues to take it 1-2x a week, 30 pills should last 6+ months. Call with any concerns.       Insomnia    Doing well on his belsomra. Refill given today. Call with any concerns.        Other Visit Diagnoses    Gastroenteritis       Let us know if not getting better. Zofran sent to his pharmacy for comfort.        Follow up plan: Return in about 3 months (around 08/10/2018) for follow up anxiety, records release from work for screening labs please.

## 2018-05-10 NOTE — Assessment & Plan Note (Signed)
Doing well on his belsomra. Refill given today. Call with any concerns.

## 2018-05-10 NOTE — Assessment & Plan Note (Signed)
Under good control on current regimen. Continue current regimen. Continue to monitor. Call with any concerns. Refills given. BMP checked today. ° °

## 2018-05-11 ENCOUNTER — Ambulatory Visit: Payer: Self-pay | Admitting: Family Medicine

## 2018-05-11 ENCOUNTER — Encounter: Payer: Self-pay | Admitting: Family Medicine

## 2018-05-11 LAB — BASIC METABOLIC PANEL
BUN / CREAT RATIO: 16 (ref 9–20)
BUN: 13 mg/dL (ref 6–24)
CO2: 23 mmol/L (ref 20–29)
CREATININE: 0.82 mg/dL (ref 0.76–1.27)
Calcium: 9.2 mg/dL (ref 8.7–10.2)
Chloride: 102 mmol/L (ref 96–106)
GFR, EST AFRICAN AMERICAN: 123 mL/min/{1.73_m2} (ref >59)
GFR, EST NON AFRICAN AMERICAN: 107 mL/min/{1.73_m2} (ref >59)
GLUCOSE: 91 mg/dL (ref 65–99)
Potassium: 4 mmol/L (ref 3.5–5.2)
Sodium: 139 mmol/L (ref 134–144)

## 2018-05-25 ENCOUNTER — Encounter: Payer: Self-pay | Admitting: Nurse Practitioner

## 2018-05-25 ENCOUNTER — Ambulatory Visit (INDEPENDENT_AMBULATORY_CARE_PROVIDER_SITE_OTHER): Payer: 59 | Admitting: Nurse Practitioner

## 2018-05-25 VITALS — BP 133/79 | HR 87 | Temp 98.4°F | Ht 67.0 in | Wt 147.0 lb

## 2018-05-25 DIAGNOSIS — M549 Dorsalgia, unspecified: Secondary | ICD-10-CM | POA: Insufficient documentation

## 2018-05-25 DIAGNOSIS — M545 Low back pain, unspecified: Secondary | ICD-10-CM

## 2018-05-25 DIAGNOSIS — M25511 Pain in right shoulder: Secondary | ICD-10-CM | POA: Insufficient documentation

## 2018-05-25 NOTE — Assessment & Plan Note (Addendum)
Acute, initial injury 05/21/18.  Continue Flexeril and NSAID script from urgent care + attend PT per current orders.  Referral to ortho d/t workplace injury with continued pain.  No opioid prescribed d/t patient being on Klonopin and Belsomra, discussed with patient and her reported understanding and agreeance.

## 2018-05-25 NOTE — Progress Notes (Signed)
BP 133/79   Pulse 87   Temp 98.4 F (36.9 C) (Oral)   Ht 5\' 7"  (1.702 m)   Wt 147 lb (66.7 kg)   SpO2 98%   BMI 23.02 kg/m    Subjective:    Patient ID: Shawn Ware, male    DOB: [birth year], 47 y.o.   MRN: [id number]  HPI: Shawn Ware is a 47 y.o. male presents for acute back pain  Chief Complaint  Patient presents with  . Pain    pt states he has had low back pain and right shoulder pain since Monday. States a tote fell on him at work    BACK PAIN Had injury at work on Monday, 05/21/18.  Was pushing some materials, totes 7-8 high, and went to push them in.  When he turned around the totes fell on his shoulder and back.  Each tote was about 30 pounds.  He is Production designer, theatre/television/film in a plant.  Went to urgent care Wednesday, urgent care in [place].  They performed imaging of shoulder and back he reports and reported to him he "just strained muscle" with no fractures.  No records in chart for this visit, will attempt to obtain. He reports they placed him on Flexeril and a "stronger ibuprofen" + have scheduled for him to attend physical therapy; his first appointment is today.  He did not file paperwork with his workplace, have encouraged him to do so since injury was at workplace setting. Duration: days Mechanism of injury: refer to above Location: right side low back and right shoulder and low back Onset: sudden Severity: 10/10, when he is laying down he has no pain 0/10 Quality: sharp Frequency: intermittent Radiation: none Aggravating factors: lifting, movement, walking, bending and prolonged sitting Alleviating factors: urgent care prescribed him Flexeril and "stronger Ibuprofen", heat, laying and muscle relaxer Status: stable Treatments attempted: Flexeril helps at night, heat and aleve  Relief with NSAIDs?: mild Nighttime pain:  no Paresthesias / decreased sensation:  no Bowel / bladder incontinence:  no Fevers:  no Dysuria / urinary frequency:  no  Relevant past  medical, surgical, family and social history reviewed and updated as indicated. Interim medical history since our last visit reviewed. Allergies and medications reviewed and updated.  Review of Systems  Constitutional: Negative for activity change, diaphoresis, fatigue and fever.  Respiratory: Negative for cough, chest tightness, shortness of breath and wheezing.   Cardiovascular: Negative for chest pain, palpitations and leg swelling.  Gastrointestinal: Negative for abdominal distention, abdominal pain, constipation, diarrhea, nausea and vomiting.  Endocrine: Negative for cold intolerance, heat intolerance, polydipsia, polyphagia and polyuria.  Musculoskeletal: Negative.   Skin: Negative.   Neurological: Negative for dizziness, syncope, weakness, light-headedness, numbness and headaches.  Psychiatric/Behavioral: Negative.     Per HPI unless specifically indicated above     Objective:    BP 133/79   Pulse 87   Temp 98.4 F (36.9 C) (Oral)   Ht 5\' 7"  (1.702 m)   Wt 147 lb (66.7 kg)   SpO2 98%   BMI 23.02 kg/m   Wt Readings from Last 3 Encounters:  05/25/18 147 lb (66.7 kg)  05/10/18 143 lb 12.8 oz (65.2 kg)  10/13/17 147 lb 2 oz (66.7 kg)    Physical Exam  Constitutional: He is oriented to person, place, and time. He appears well-developed and well-nourished.  HENT:  Head: Normocephalic and atraumatic.  Right Ear: Hearing normal. No drainage.  Left Ear: Hearing normal. No drainage.  Mouth/Throat: Uvula  is midline and mucous membranes are normal.  Eyes: Pupils are equal, round, and reactive to light. Conjunctivae, EOM and lids are normal. Right eye exhibits no discharge. Left eye exhibits no discharge.  Neck: Trachea normal and normal range of motion. Neck supple. No JVD present. Carotid bruit is not present. No thyromegaly present.  Cardiovascular: Normal rate, regular rhythm, S1 normal, S2 normal and normal heart sounds. Exam reveals no gallop.  No murmur  heard. Pulmonary/Chest: Effort normal and breath sounds normal.  Abdominal: Soft. Bowel sounds are normal. There is no splenomegaly or hepatomegaly.  Musculoskeletal:       Right shoulder: He exhibits decreased range of motion, tenderness and decreased strength. He exhibits no swelling and no crepitus.       Left shoulder: He exhibits normal range of motion, no tenderness, no crepitus and normal strength.       Lumbar back: He exhibits decreased range of motion, tenderness and pain. He exhibits no swelling and no edema.  Positive empty can to right side, with decreased ROM (unable to perform full flexion or extension), no pain with abduction.  Mild discomfort with ROM left arm, however discomfort noted to mid-scapula and able to perform ROM. Decreased ROM lower back with grimacing and pain reported.  Neurological: He is alert and oriented to person, place, and time. He has normal reflexes.  Skin: Skin is warm, dry and intact. Capillary refill takes less than 2 seconds. No rash noted.  Psychiatric: He has a normal mood and affect. His behavior is normal. Judgment and thought content normal.  Nursing note and vitals reviewed.   Results for orders placed or performed in visit on 05/10/18  Basic Metabolic Panel (BMET)  Result Value Ref Range   Glucose 91 65 - 99 mg/dL   BUN 13 6 - 24 mg/dL   Creatinine, Ser [id number] 0.76 - 1.27 mg/dL   GFR calc non Af Amer 107 >59 mL/min/1.73   GFR calc Af Amer 123 >59 mL/min/1.73   BUN/Creatinine Ratio 16 9 - 20   Sodium 139 134 - 144 mmol/L   Potassium 4.0 3.5 - 5.2 mmol/L   Chloride 102 96 - 106 mmol/L   CO2 23 20 - 29 mmol/L   Calcium 9.2 8.7 - 10.2 mg/dL      Assessment & Plan:   Problem List Items Addressed This Visit      Other   Back pain    Acute, initial injury 05/21/18.  Continue Flexeril and NSAID script from urgent care + attend PT per current orders.  Referral to ortho d/t workplace injury with continued pain.  No opioid prescribed d/t  patient being on Klonopin and Belsomra, discussed with patient and her reported understanding and agreeance.      Relevant Orders   Ambulatory referral to Orthopedic Surgery   Right shoulder pain    Acute, workplace injury 05/21/18.  Continue current Flexeril and NSAID script per urgent care + attend PT appointments.  May use heat and Icy/Hot.  Referral to ortho for further assessment d/t workplace injury with continued pain.  No opioid prescribed d/t patient being on Klonopin and Belsomra, discussed with patient and her reported understanding and agreeance.          Follow up plan: Return if symptoms worsen or fail to improve.

## 2018-05-25 NOTE — Patient Instructions (Signed)
Make sure that you file paperwork at your workplace as this is a workplace related injury.  Back Pain, Adult Back pain is very common. The pain often gets better over time. The cause of back pain is usually not dangerous. Most people can learn to manage their back pain on their own. Follow these instructions at home: Watch your back pain for any changes. The following actions may help to lessen any pain you are feeling:  Stay active. Start with short walks on flat ground if you can. Try to walk farther each day.  Exercise regularly as told by your doctor. Exercise helps your back heal faster. It also helps avoid future injury by keeping your muscles strong and flexible.  Do not sit, drive, or stand in one place for more than 30 minutes.  Do not stay in bed. Resting more than 1-2 days can slow down your recovery.  Be careful when you bend or lift an object. Use good form when lifting: ? Bend at your knees. ? Keep the object close to your body. ? Do not twist.  Sleep on a firm mattress. Lie on your side, and bend your knees. If you lie on your back, put a pillow under your knees.  Take medicines only as told by your doctor.  Put ice on the injured area. ? Put ice in a plastic bag. ? Place a towel between your skin and the bag. ? Leave the ice on for 20 minutes, 2-3 times a day for the first 2-3 days. After that, you can switch between ice and heat packs.  Avoid feeling anxious or stressed. Find good ways to deal with stress, such as exercise.  Maintain a healthy weight. Extra weight puts stress on your back.  Contact a doctor if:  You have pain that does not go away with rest or medicine.  You have worsening pain that goes down into your legs or buttocks.  You have pain that does not get better in one week.  You have pain at night.  You lose weight.  You have a fever or chills. Get help right away if:  You cannot control when you poop (bowel movement) or pee  (urinate).  Your arms or legs feel weak.  Your arms or legs lose feeling (numbness).  You feel sick to your stomach (nauseous) or throw up (vomit).  You have belly (abdominal) pain.  You feel like you may pass out (faint). This information is not intended to replace advice given to you by your health care provider. Make sure you discuss any questions you have with your health care provider. Document Released: 11/30/2007 Document Revised: 11/19/2015 Document Reviewed: 10/15/2013 Elsevier Interactive Patient Education  [employer]2018 [employer]

## 2018-05-25 NOTE — Assessment & Plan Note (Signed)
Acute, workplace injury 05/21/18.  Continue current Flexeril and NSAID script per urgent care + attend PT appointments.  May use heat and Icy/Hot.  Referral to ortho for further assessment d/t workplace injury with continued pain.  No opioid prescribed d/t patient being on Klonopin and Belsomra, discussed with patient and her reported understanding and agreeance.

## 2018-05-30 ENCOUNTER — Ambulatory Visit: Payer: Self-pay | Admitting: Family Medicine

## 2018-05-31 ENCOUNTER — Encounter: Payer: Self-pay | Admitting: Family Medicine

## 2018-05-31 ENCOUNTER — Telehealth: Payer: Self-pay | Admitting: Family Medicine

## 2018-05-31 NOTE — Telephone Encounter (Signed)
Message relayed to patient. Verbalized understanding and denied questions.   

## 2018-05-31 NOTE — Telephone Encounter (Signed)
Letter up front for him

## 2018-05-31 NOTE — Telephone Encounter (Signed)
Shawn Ware presented in office wanting to see if he can get an extension on his work note. Wants to know if it can be extended until Tuesday  06/05/2018 due to still not being able to do his work. States that he has an appointment with a specialist on Monday. Please advise.

## 2018-06-05 ENCOUNTER — Telehealth: Payer: Self-pay

## 2018-06-05 ENCOUNTER — Ambulatory Visit (INDEPENDENT_AMBULATORY_CARE_PROVIDER_SITE_OTHER): Payer: BLUE CROSS/BLUE SHIELD

## 2018-06-05 DIAGNOSIS — Z23 Encounter for immunization: Secondary | ICD-10-CM

## 2018-06-05 DIAGNOSIS — Z111 Encounter for screening for respiratory tuberculosis: Secondary | ICD-10-CM

## 2018-06-05 DIAGNOSIS — Z0184 Encounter for antibody response examination: Secondary | ICD-10-CM

## 2018-06-05 NOTE — Telephone Encounter (Signed)
Orders in. If he has a form that needs to be filled out, he will need an appointment.

## 2018-06-05 NOTE — Telephone Encounter (Signed)
Patient would like to have Hepatitis B vaccine, Quantiferon test and flu vaccine please.

## 2018-06-05 NOTE — Telephone Encounter (Signed)
He didn't need any forms to fill out but he might come back to get his quantiferon test. Pt stated he did have a quantiferon test back in April, 2019 and stated he will ask his new employer if they can work with that or he needs a new quantiferon test.

## 2018-06-06 ENCOUNTER — Encounter: Payer: Self-pay | Admitting: Family Medicine

## 2018-06-06 ENCOUNTER — Ambulatory Visit (INDEPENDENT_AMBULATORY_CARE_PROVIDER_SITE_OTHER): Payer: 59 | Admitting: Family Medicine

## 2018-06-06 VITALS — BP 120/78 | HR 102 | Temp 98.2°F | Wt 147.0 lb

## 2018-06-06 DIAGNOSIS — N529 Male erectile dysfunction, unspecified: Secondary | ICD-10-CM | POA: Diagnosis not present

## 2018-06-06 LAB — MEASLES/MUMPS/RUBELLA IMMUNITY
MUMPS ABS, IGG: 15 AU/mL (ref >10.9)
RUBEOLA AB, IGG: >300 AU/mL (ref >16.4)
Rubella Antibodies, IGG: 2.11 index (ref >0.99)

## 2018-06-06 MED ORDER — TADALAFIL 20 MG PO TABS: 10.0000 mg | ORAL_TABLET | ORAL | 3 refills | Status: DC | PRN

## 2018-06-06 NOTE — Assessment & Plan Note (Signed)
Restart cialis prn, follow up if having side effects or poor response.

## 2018-06-06 NOTE — Progress Notes (Signed)
   BP 120/78   Pulse (!) 102   Temp 98.2 F (36.8 C) (Oral)   Wt 147 lb (66.7 kg)   SpO2 97%   BMI 23.02 kg/m    Subjective:    Patient ID: Shawn Ware, male    DOB: 03/09/1971, 47 y.o.   MRN: [id number]  HPI: Shawn Ware is a [age] y.o. male  Chief Complaint  Patient presents with  . Medication Refill    E.D.    Here today wanting to restart cialis for his erectile dysfunction. Has been on it in the past intermittently and states it's always worked very well for him. No side effects noted. Otherwise, doing well with no new concerns.   Relevant past medical, surgical, family and social history reviewed and updated as indicated. Interim medical history since our last visit reviewed. Allergies and medications reviewed and updated.  Review of Systems  Per HPI unless specifically indicated above     Objective:    BP 120/78   Pulse (!) 102   Temp 98.2 F (36.8 C) (Oral)   Wt 147 lb (66.7 kg)   SpO2 97%   BMI 23.02 kg/m   Wt Readings from Last 3 Encounters:  06/06/18 147 lb (66.7 kg)  05/25/18 147 lb (66.7 kg)  05/10/18 143 lb 12.8 oz (65.2 kg)    Physical Exam  Constitutional: He is oriented to person, place, and time. He appears well-developed and well-nourished. No distress.  HENT:  Head: Atraumatic.  Eyes: Pupils are equal, round, and reactive to light. Conjunctivae and EOM are normal.  Neck: Normal range of motion. Neck supple.  Cardiovascular: Normal rate, regular rhythm and normal heart sounds.  Pulmonary/Chest: Effort normal and breath sounds normal.  Musculoskeletal: Normal range of motion.  Neurological: He is alert and oriented to person, place, and time.  Skin: Skin is warm and dry.  Psychiatric: He has a normal mood and affect. His behavior is normal.  Nursing note and vitals reviewed.   Results for orders placed or performed in visit on 06/05/18  Measles/Mumps/Rubella Immunity  Result Value Ref Range   Rubella Antibodies, IGG 2.11  Immune >0.99 index   RUBEOLA AB, IGG >300.0 Immune >16.4 AU/mL   MUMPS ABS, IGG 15.0 Immune >10.9 AU/mL      Assessment & Plan:   Problem List Items Addressed This Visit      Other   ED (erectile dysfunction) - Primary    Restart cialis prn, follow up if having side effects or poor response.           Follow up plan: Return for as scheduled for 6 month f/u.

## 2018-06-07 ENCOUNTER — Ambulatory Visit: Payer: Self-pay

## 2018-06-29 ENCOUNTER — Other Ambulatory Visit: Payer: Self-pay | Admitting: Family Medicine

## 2018-06-29 NOTE — Telephone Encounter (Signed)
Requested medication (s) are due for refill today: yes  Requested medication (s) are on the active medication list: yes  Last refill:  06/13/17  Future visit scheduled:11/09/18   Notes to clinic:  Rx has expired    Requested Prescriptions  Pending Prescriptions Disp Refills   clonazePAM (KLONOPIN) 0.5 MG tablet [Pharmacy Med Name: CLONAZEPAM 0.5 MG TAB] 30 tablet     Sig: TAKE 1 TABLET BY MOUTH AT BEDTIME AS NEEDED     Not Delegated - Psychiatry:  Anxiolytics/Hypnotics Failed - 06/29/2018  9:49 AM      Failed - This refill cannot be delegated      Failed - Urine Drug Screen completed in last 360 days.      Passed - Valid encounter within last 6 months    Recent Outpatient Visits          3 weeks ago Erectile dysfunction, unspecified erectile dysfunction type   [facility] Part[facility] Nearing[place]ane, Rachel [place], [place]   1 month ago Acute midline low back pain without sciatica   [facility] [place], [place] T, NP   1 month ago Essential hypertension   [facility] [place]Johnson, [name], DO   8 months ago Diarrhea, unspecified type   [name], [name], [facility]   10 months ago H. pylori infection   [facility] [name], [place] P, DO      Future Appointments            In 4 months [name], [name], DO [facility]         Signed Prescriptions Disp Refills   atorvastatin (LIPITOR) 40 MG tablet 90 tablet 1    Sig: TAKE 1 TABLET BY MOUTH DAILY     Cardiovascular:  Antilipid - Statins Failed - 06/29/2018  9:49 AM      Failed - Total Cholesterol in normal range and within 360 days    Cholesterol, Total  Date Value Ref Range Status  11/09/2016 204 (H) 100 - 199 mg/dL Final   Cholesterol Piccolo, Waived  Date Value Ref Range Status  05/23/2016 140 <200 mg/dL Final    Comment:                            Desirable                <200                         Borderline High      200- 239  High                     >239          Failed - LDL in normal range and within 360 days    LDL Calculated  Date Value Ref Range Status  11/09/2016 119 (H) 0 - 99 mg/dL Final         Failed - HDL in normal range and within 360 days    HDL  Date Value Ref Range Status  11/09/2016 54 >39 mg/dL Final         Failed - Triglycerides in normal range and within 360 days    Triglycerides  Date Value Ref Range Status  11/09/2016 153 (H) 0 - 149 mg/dL Final   Triglycerides Piccolo,Waived  Date Value Ref Range Status  05/23/2016 140 <150 mg/dL Final  Comment:                            Normal                   <150                         Borderline High     150 - 199                         High                200 - 499                         Very High                >499          Passed - Patient is not pregnant      Passed - Valid encounter within last 12 months    Recent Outpatient Visits          3 weeks ago Erectile dysfunction, unspecified erectile dysfunction type   [facility] Particia NearingLane, Rachel [place], [place]   1 month ago Acute midline low back pain without sciatica   [facility] [place], Corrie DandyJolene T, NP   1 month ago Essential hypertension   [facility] [place]Johnson, [place] P, DO   8 months ago Diarrhea, unspecified type   [name], [name], [facility]   10 months ago H. pylori infection   [facility] [name], [place] P, DO      Future Appointments            In 4 months [name], Oralia RudMegan P, DO [facility]

## 2018-08-02 ENCOUNTER — Other Ambulatory Visit: Payer: Self-pay | Admitting: Family Medicine

## 2018-08-02 ENCOUNTER — Encounter: Payer: Self-pay | Admitting: Family Medicine

## 2018-08-02 NOTE — Telephone Encounter (Signed)
Requested medication (s) are due for refill today: yes  Requested medication (s) are on the active medication list: yes  Last refill:  05/10/18  Future visit scheduled: yes  Notes to clinic:  Not delegated    Requested Prescriptions  Pending Prescriptions Disp Refills   clonazePAM (KLONOPIN) 0.5 MG tablet [Pharmacy Med Name: CLONAZEPAM 0.5 MG TAB] 30 tablet     Sig: TAKE 1 TABLET BY MOUTH AT BEDTIME AS NEEDED     Not Delegated - Psychiatry:  Anxiolytics/Hypnotics Failed - 08/02/2018 12:03 PM      Failed - This refill cannot be delegated      Failed - Urine Drug Screen completed in last 360 days.      Passed - Valid encounter within last 6 months    Recent Outpatient Visits          1 month ago Erectile dysfunction, unspecified erectile dysfunction type   [facility] [name], [place]   2 months ago Acute midline low back pain without sciatica   [facility] McGuffey, [name], NP   2 months ago Essential hypertension   [facility] [name], [name], DO   9 months ago Diarrhea, unspecified type   [facility], [name], DO   11 months ago H. pylori infection   [facility], [name], DO      Future Appointments            In 3 months [name], [name], DO [employer], [facility]

## 2018-08-02 NOTE — Telephone Encounter (Signed)
Letter printed to mail. °

## 2018-08-02 NOTE — Telephone Encounter (Signed)
Tried to reach patient. Was unable to leave a message

## 2018-08-02 NOTE — Telephone Encounter (Signed)
Requested medication (s) are due for refill today -yes  Requested medication (s) are on the active medication list -yes  Future visit scheduled -yes  Last refill: 05/10/18  Notes to clinic: Patient is requesting a non delegated RX- sent for provider review.  Requested Prescriptions  Pending Prescriptions Disp Refills   clonazePAM (KLONOPIN) 0.5 MG tablet [Pharmacy Med Name: CLONAZEPAM 0.5 MG TAB] 30 tablet     Sig: TAKE 1 TABLET BY MOUTH AT BEDTIME AS NEEDED     Not Delegated - Psychiatry:  Anxiolytics/Hypnotics Failed - 08/02/2018  8:17 AM      Failed - This refill cannot be delegated      Failed - Urine Drug Screen completed in last 360 days.      Passed - Valid encounter within last 6 months    Recent Outpatient Visits          1 month ago Erectile dysfunction, unspecified erectile dysfunction type   [facility] [name], [place]   2 months ago Acute midline low back pain without sciatica   [facility] Eva, [place] T, NP   2 months ago Essential hypertension   [facility] [place], [name], DO   9 months ago Diarrhea, unspecified type   [facility], [name], DO   11 months ago H. pylori infection   [facility], [place], DO      Future Appointments            In 3 months [name], [name], DO [employer] [facility], [facility]            Requested Prescriptions  Pending Prescriptions Disp Refills   clonazePAM (KLONOPIN) 0.5 MG tablet [Pharmacy Med Name: CLONAZEPAM 0.5 MG TAB] 30 tablet     Sig: TAKE 1 TABLET BY MOUTH AT BEDTIME AS NEEDED     Not Delegated - Psychiatry:  Anxiolytics/Hypnotics Failed - 08/02/2018  8:17 AM      Failed - This refill cannot be delegated      Failed - Urine Drug Screen completed in last 360 days.      Passed - Valid encounter within last 6 months    Recent Outpatient Visits          1 month ago Erectile dysfunction, unspecified erectile  dysfunction type   [facility] [name], [place]   2 months ago Acute midline low back pain without sciatica   [facility] [place], [place] T, NP   2 months ago Essential hypertension   [facility] [place], [name], DO   9 months ago Diarrhea, unspecified type   [facility], [name], DO   11 months ago H. pylori infection   [facility], [place], DO      Future Appointments            In 3 months [name], [name], DO [employer], [facility]

## 2018-08-02 NOTE — Telephone Encounter (Signed)
Tried to reach patient but could not leave VM as message stated not accepting messages at this time.

## 2018-08-08 NOTE — Telephone Encounter (Signed)
Called patient again message said patient could not be reached at this time

## 2018-11-09 ENCOUNTER — Ambulatory Visit: Payer: Self-pay | Admitting: Family Medicine

## 2018-11-09 ENCOUNTER — Other Ambulatory Visit: Payer: Self-pay

## 2018-11-09 NOTE — Progress Notes (Deleted)
There were no vitals taken for this visit.   Subjective:    Patient ID: Shawn Ware, male    DOB: 06-Jul-1970, 48 y.o.   MRN: [id number]  HPI: Shawn Ware is a 48 y.o. male  No chief complaint on file.  HYPERTENSION / HYPERLIPIDEMIA Satisfied with current treatment? {Blank single:19197::"yes","no"} Duration of hypertension: {Blank single:19197::"chronic","months","years"} BP monitoring frequency: {Blank single:19197::"not checking","rarely","daily","weekly","monthly","a few times a day","a few times a week","a few times a month"} BP range:  BP medication side effects: {Blank single:19197::"yes","no"} Past BP meds: {Blank multiple:19196::"none","amlodipine","amlodipine/benazepril","atenolol","benazepril","benazepril/HCTZ","bisoprolol (bystolic)","carvedilol","chlorthalidone","clonidine","diltiazem","exforge HCT","HCTZ","irbesartan (avapro)","labetalol","lisinopril","lisinopril-HCTZ","losartan (cozaar)","methyldopa","nifedipine","olmesartan (benicar)","olmesartan-HCTZ","quinapril","ramipril","spironalactone","tekturna","valsartan","valsartan-HCTZ","verapamil"} Duration of hyperlipidemia: {Blank single:19197::"chronic","months","years"} Cholesterol medication side effects: {Blank single:19197::"yes","no"} Cholesterol supplements: {Blank multiple:19196::"none","fish oil","niacin","red yeast rice"} Past cholesterol medications: {Blank multiple:19196::"none","atorvastain (lipitor)","lovastatin (mevacor)","pravastatin (pravachol)","rosuvastatin (crestor)","simvastatin (zocor)","vytorin","fenofibrate (tricor)","gemfibrozil","ezetimide (zetia)","niaspan","lovaza"} Medication compliance: {Blank single:19197::"excellent compliance","good compliance","fair compliance","poor compliance"} Aspirin: {Blank single:19197::"yes","no"} Recent stressors: {Blank single:19197::"yes","no"} Recurrent headaches: {Blank single:19197::"yes","no"} Visual changes: {Blank single:19197::"yes","no"}  Palpitations: {Blank single:19197::"yes","no"} Dyspnea: {Blank single:19197::"yes","no"} Chest pain: {Blank single:19197::"yes","no"} Lower extremity edema: {Blank single:19197::"yes","no"} Dizzy/lightheaded: {Blank single:19197::"yes","no"}  DEPRESSION Mood status: {Blank single:19197::"controlled","uncontrolled","better","worse","exacerbated","stable"} Satisfied with current treatment?: {Blank single:19197::"yes","no"} Symptom severity: {Blank single:19197::"mild","moderate","severe"}  Duration of current treatment : {Blank single:19197::"chronic","months","years"} Side effects: {Blank single:19197::"yes","no"} Medication compliance: {Blank single:19197::"excellent compliance","good compliance","fair compliance","poor compliance"} Psychotherapy/counseling: {Blank single:19197::"yes","no"} {Blank single:19197::"current","in the past"} Previous psychiatric medications: {Blank multiple:19196::"abilify","amitryptiline","buspar","celexa","cymbalta","depakote","effexor","lamictal","lexapro","lithium","nortryptiline","paxil","prozac","pristiq (desvenlafaxine","seroquel","wellbutrin","zoloft","zyprexa"} Depressed mood: {Blank single:19197::"yes","no"} Anxious mood: {Blank single:19197::"yes","no"} Anhedonia: {Blank single:19197::"yes","no"} Significant weight loss or gain: {Blank single:19197::"yes","no"} Insomnia: {Blank single:19197::"yes","no"} {Blank single:19197::"hard to fall asleep","hard to stay asleep"} Fatigue: {Blank single:19197::"yes","no"} Feelings of worthlessness or guilt: {Blank single:19197::"yes","no"} Impaired concentration/indecisiveness: {Blank single:19197::"yes","no"} Suicidal ideations: {Blank single:19197::"yes","no"} Hopelessness: {Blank single:19197::"yes","no"} Crying spells: {Blank single:19197::"yes","no"} Depression screen [facility] 2/9 05/10/2018 10/13/2017 08/22/2017 07/06/2017 03/27/2017  Decreased Interest 0 0 0 - 0  Down, Depressed, Hopeless 0 0 0 0 1  PHQ - 2 Score  0 0 0 0 1  Altered sleeping 0 0 - 0 2  Tired, decreased energy 0 0 - 0 1  Change in appetite 0 0 - 0 0  Feeling bad or failure about yourself  0 0 - 0 0  Trouble concentrating 0 0 - 0 0  Moving slowly or fidgety/restless 0 0 - 0 0  Suicidal thoughts 0 0 - 0 0  PHQ-9 Score 0 0 - 0 4  Difficult doing work/chores Not difficult at all Not difficult at all - - -   INSOMNIA Duration: {Blank single:19197::"chronic","months","years"} Satisfied with sleep quality: {Blank single:19197::"yes","no"} Difficulty falling asleep: {Blank single:19197::"yes","no"} Difficulty staying asleep: {Blank single:19197::"yes","no"} Waking a few hours after sleep onset: {Blank single:19197::"yes","no"} Early morning awakenings: {Blank single:19197::"yes","no"} Daytime hypersomnolence: {Blank single:19197::"yes","no"} Wakes feeling refreshed: {Blank single:19197::"yes","no"} Good sleep hygiene: {Blank single:19197::"yes","no"} Apnea: {Blank single:19197::"yes","no"} Snoring: {Blank single:19197::"yes","no"} Depressed/anxious mood: {Blank single:19197::"yes","no"} Recent stress: {Blank single:19197::"yes","no"} Restless legs/nocturnal leg cramps: {Blank single:19197::"yes","no"} Chronic pain/arthritis: {Blank single:19197::"yes","no"} History of sleep study: {Blank single:19197::"yes","no"} Treatments attempted: {Blank multiple:19196::"none","melatonin","uinsom","benadryl","ambien"}    Relevant past medical, surgical, family and social history reviewed and updated as indicated. Interim medical history since our last visit reviewed. Allergies and medications reviewed and updated.  Review of Systems  Per HPI unless specifically indicated above     Objective:    There were no vitals taken for this visit.  Wt Readings from Last 3 Encounters:  06/06/18 147 lb (66.7 kg)  05/25/18 147 lb (66.7 kg)  05/10/18 143 lb 12.8 oz (65.2 kg)    Physical Exam Vitals signs and nursing note reviewed.  Constitutional:       General: He is not in acute distress.    Appearance: Normal appearance.  He is not ill-appearing, toxic-appearing or diaphoretic.  HENT:     Head: Normocephalic and atraumatic.     Right Ear: External ear normal.     Left Ear: External ear normal.     Nose: Nose normal.     Mouth/Throat:     Mouth: Mucous membranes are moist.     Pharynx: Oropharynx is clear.  Eyes:     General: No scleral icterus.       Right eye: No discharge.        Left eye: No discharge.     Conjunctiva/sclera: Conjunctivae normal.     Pupils: Pupils are equal, round, and reactive to light.  Neck:     Musculoskeletal: Normal range of motion.  Pulmonary:     Effort: Pulmonary effort is normal. No respiratory distress.     Comments: Speaking in full sentences Musculoskeletal: Normal range of motion.  Skin:    Coloration: Skin is not jaundiced or pale.     Findings: No bruising, erythema, lesion or rash.  Neurological:     Mental Status: He is alert and oriented to person, place, and time. Mental status is at baseline.  Psychiatric:        Mood and Affect: Mood normal.        Behavior: Behavior normal.        Thought Content: Thought content normal.        Judgment: Judgment normal.     Results for orders placed or performed in visit on 06/05/18  Measles/Mumps/Rubella Immunity  Result Value Ref Range   Rubella Antibodies, IGG 2.11 Immune >0.99 index   RUBEOLA AB, IGG >300.0 Immune >16.4 AU/mL   MUMPS ABS, IGG 15.0 Immune >10.9 AU/mL      Assessment & Plan:   Problem List Items Addressed This Visit      Cardiovascular and Mediastinum   Hypertension - Primary     Other   Hyperlipidemia   Depression, major, single episode, moderate (HCC)   Insomnia       Follow up plan: No follow-ups on file.    . {Blank single:19197::"This visit was completed via telephone due to the restrictions of the COVID-19 pandemic. All issues as above were discussed and addressed but no physical exam was  performed. If it was felt that the patient should be evaluated in the office, they were directed there. The patient verbally consented to this visit. Patient was unable to complete an audio/visual visit due to {Blank single:19197::"Technical difficulties,Lack of internet","Lack of equipment","***"}. Due to the catastrophic nature of the COVID-19 pandemic, this visit was done through audio contact only.","This visit was completed via {Blank single:19197::"WebEx","FaceTime","Skype","Doximity","***"} due to the restrictions of the COVID-19 pandemic. All issues as above were discussed and addressed. Physical exam was done as above through visual confirmation on {Blank single:19197::"WebEx","***"}. If it was felt that the patient should be evaluated in the office, they were directed there. The patient verbally consented to this visit."} . Location of the patient: {Blank single:19197::"home","work","parking lot","***"} . Location of the provider: home . Those involved with this call:  . Provider: Olevia Perches, DO . CMA: Tiffany Reel, CMA . Front Desk/Registration: Adela Ports  . Time spent on call: {Blank single:19197::"*** minutes on the phone discussing health concerns. *** minutes total spent in review of patient's record and preparation of their chart.","*** minutes with patient face to face via video conference. More than 50% of this time was spent in counseling and coordination of care. *** minutes total spent in review  of patient's record and preparation of their chart."}

## 2018-11-16 ENCOUNTER — Encounter: Payer: Self-pay | Admitting: Family Medicine

## 2019-02-13 ENCOUNTER — Other Ambulatory Visit: Payer: Self-pay | Admitting: Family Medicine

## 2019-02-13 NOTE — Telephone Encounter (Signed)
Requested Prescriptions  Pending Prescriptions Disp Refills  . amLODipine (NORVASC) 10 MG tablet [Pharmacy Med Name: AMLODIPINE BESYLATE 10 MG TAB] 30 tablet 0    Sig: TAKE 1 TABLET BY MOUTH ONCE DAILY     Cardiovascular:  Calcium Channel Blockers Failed - 02/13/2019  3:56 PM      Failed - Valid encounter within last 6 months    Recent Outpatient Visits          8 months ago Erectile dysfunction, unspecified erectile dysfunction type   [facility] [place], PA-C   8 months ago Acute midline low back pain without sciatica   [place], Barbaraann Faster, NP   9 months ago Essential hypertension   [place], [place], DO   1 year ago Diarrhea, unspecified type   [place], [name], DO   1 year ago H. pylori infection   [facility] [employer], [name], DO             Passed - Last BP in normal range    BP Readings from Last 1 Encounters:  06/06/18 120/78         . atorvastatin (LIPITOR) 40 MG tablet [Pharmacy Med Name: ATORVASTATIN CALCIUM 40 MG TAB] 30 tablet 0    Sig: TAKE 1 TABLET BY MOUTH ONCE DAILY     Cardiovascular:  Antilipid - Statins Failed - 02/13/2019  3:56 PM      Failed - Total Cholesterol in normal range and within 360 days    Cholesterol, Total  Date Value Ref Range Status  11/09/2016 204 (H) 100 - 199 mg/dL Final   Cholesterol Piccolo, Waived  Date Value Ref Range Status  05/23/2016 140 <200 mg/dL Final    Comment:                            Desirable                <200                         Borderline High      200- 239                         High                     >239          Failed - LDL in normal range and within 360 days    LDL Calculated  Date Value Ref Range Status  11/09/2016 119 (H) 0 - 99 mg/dL Final         Failed - HDL in normal range and within 360 days    HDL  Date Value Ref Range Status  11/09/2016 54 >39 mg/dL Final        Failed - Triglycerides in normal range and within 360 days    Triglycerides  Date Value Ref Range Status  11/09/2016 153 (H) 0 - 149 mg/dL Final   Triglycerides Piccolo,Waived  Date Value Ref Range Status  05/23/2016 140 <150 mg/dL Final    Comment:                            Normal                   <  150                         Borderline High     150 - 199                         High                200 - 499                         Very High                >499          Passed - Patient is not pregnant      Passed - Valid encounter within last 12 months    Recent Outpatient Visits          8 months ago Erectile dysfunction, unspecified erectile dysfunction type   [facility] [place] MaserLane, Rachel [place], [place]   8 months ago Acute midline low back pain without sciatica   [facility] [place], Dorie RankJolene T, NP   9 months ago Essential hypertension   [facility] [place], [place] P, DO   1 year ago Diarrhea, unspecified type   [facility] [place], [name], DO   1 year ago H. pylori infection   [facility] [place], [place] P, DO             Attempted to contact patient to notify him he is due for a follow up appointment.  When I called, I got an automated message that said the patient is not accepting calls at this time.  30 day courtesy refill sent.

## 2019-03-14 ENCOUNTER — Other Ambulatory Visit: Payer: Self-pay | Admitting: Family Medicine

## 2019-03-14 NOTE — Telephone Encounter (Signed)
Requested medication (s) are due for refill today: yes  Requested medication (s) are on the active medication list: yes  Last refill:  02/13/2019  Future visit scheduled: no  Notes to clinic: review for refill   Requested Prescriptions  Pending Prescriptions Disp Refills   atorvastatin (LIPITOR) 40 MG tablet [Pharmacy Med Name: ATORVASTATIN CALCIUM 40 MG TAB] 30 tablet 0    Sig: TAKE 1 TABLET BY MOUTH ONCE DAILY     Cardiovascular:  Antilipid - Statins Failed - 03/14/2019 11:11 AM      Failed - Total Cholesterol in normal range and within 360 days    Cholesterol, Total  Date Value Ref Range Status  11/09/2016 204 (H) 100 - 199 mg/dL Final   Cholesterol Piccolo, Waived  Date Value Ref Range Status  05/23/2016 140 <200 mg/dL Final    Comment:                            Desirable                <200                         Borderline High      200- 239                         High                     >239          Failed - LDL in normal range and within 360 days    LDL Calculated  Date Value Ref Range Status  11/09/2016 119 (H) 0 - 99 mg/dL Final         Failed - HDL in normal range and within 360 days    HDL  Date Value Ref Range Status  11/09/2016 54 >39 mg/dL Final         Failed - Triglycerides in normal range and within 360 days    Triglycerides  Date Value Ref Range Status  11/09/2016 153 (H) 0 - 149 mg/dL Final   Triglycerides Piccolo,Waived  Date Value Ref Range Status  05/23/2016 140 <150 mg/dL Final    Comment:                            Normal                   <150                         Borderline High     150 - 199                         High                200 - 499                         Very High                >499          Passed - Patient is not pregnant      Passed - Valid encounter within last 12 months    Recent Outpatient Visits  9 months ago Erectile dysfunction, unspecified erectile dysfunction type   [place], [place]   9 months ago Acute midline low back pain without sciatica   [facility] [place], Barbaraann Faster, NP   10 months ago Essential hypertension   [place], [place], DO   1 year ago Diarrhea, unspecified type   [place], DO   1 year ago H. pylori infection   [facility] [place], [place], DO

## 2019-04-03 ENCOUNTER — Ambulatory Visit (INDEPENDENT_AMBULATORY_CARE_PROVIDER_SITE_OTHER): Payer: BC Managed Care – PPO | Admitting: Family Medicine

## 2019-04-03 ENCOUNTER — Other Ambulatory Visit: Payer: Self-pay

## 2019-04-03 ENCOUNTER — Encounter: Payer: Self-pay | Admitting: Family Medicine

## 2019-04-03 VITALS — BP 139/82 | HR 85 | Temp 97.6°F | Wt 145.0 lb

## 2019-04-03 DIAGNOSIS — F321 Major depressive disorder, single episode, moderate: Secondary | ICD-10-CM | POA: Diagnosis not present

## 2019-04-03 DIAGNOSIS — G4700 Insomnia, unspecified: Secondary | ICD-10-CM

## 2019-04-03 DIAGNOSIS — I10 Essential (primary) hypertension: Secondary | ICD-10-CM

## 2019-04-03 DIAGNOSIS — E782 Mixed hyperlipidemia: Secondary | ICD-10-CM

## 2019-04-03 MED ORDER — BELSOMRA 10 MG PO TABS: 1.0000 | ORAL_TABLET | Freq: Every evening | ORAL | 5 refills | Status: DC | PRN

## 2019-04-03 MED ORDER — AMLODIPINE BESYLATE 10 MG PO TABS: 10.0000 mg | ORAL_TABLET | Freq: Every day | ORAL | 1 refills | Status: DC

## 2019-04-03 MED ORDER — CLONAZEPAM 0.5 MG PO TABS: 0.5000 mg | ORAL_TABLET | Freq: Every day | ORAL | 0 refills | Status: DC | PRN

## 2019-04-03 MED ORDER — ATORVASTATIN CALCIUM 40 MG PO TABS: 40.0000 mg | ORAL_TABLET | Freq: Every day | ORAL | 1 refills | Status: DC

## 2019-04-03 NOTE — Assessment & Plan Note (Addendum)
In exacerbation. Not interested in preventative medicine. Continue to monitor. Call with any concerns. Will give refill of klonopin. 30 pills should last at least 3 months. If he is taking it more than a couple of times a week- let us know.

## 2019-04-03 NOTE — Progress Notes (Signed)
BP 139/82   Pulse 85   Temp 97.6 F (36.4 C)   Wt 145 lb (65.8 kg)   BMI 22.71 kg/m    Subjective:    Patient ID: Shawn Ware, male    DOB: 09/10/1970, 48 y.o.   MRN: [id number]  HPI: Shawn Ware is a 48 y.o. male who presents today after being lost to follow up since November of 2019.  Chief Complaint  Patient presents with  . Insomnia    Refill on Belsomra  . Anxiety    Refill on klonopin  . Hypertension  . Hyperlipidemia   HYPERTENSION / HYPERLIPIDEMIA Satisfied with current treatment? yes Duration of hypertension: chronic BP monitoring frequency: not checking BP medication side effects: no Past BP meds: amlodipine Duration of hyperlipidemia: chronic Cholesterol medication side effects: no Cholesterol supplements: none Past cholesterol medications: atorvastain (lipitor) Medication compliance: excellent compliance Aspirin: no Recent stressors: yes Recurrent headaches: no Visual changes: no Palpitations: no Dyspnea: no Chest pain: no Lower extremity edema: no Dizzy/lightheaded: no  ANXIETY/STRESS Duration:exacerbated Anxious mood: yes  Excessive worrying: yes Irritability: yes  Sweating: no Nausea: no Palpitations:no Hyperventilation: no Panic attacks: no Agoraphobia: no  Obscessions/compulsions: no Depressed mood: yes Depression screen [facility] 2/9 04/03/2019 05/10/2018 10/13/2017 08/22/2017 07/06/2017  Decreased Interest 2 0 0 0 -  Down, Depressed, Hopeless 2 0 0 0 0  PHQ - 2 Score 4 0 0 0 0  Altered sleeping 2 0 0 - 0  Tired, decreased energy 2 0 0 - 0  Change in appetite 1 0 0 - 0  Feeling bad or failure about yourself  1 0 0 - 0  Trouble concentrating 1 0 0 - 0  Moving slowly or fidgety/restless 1 0 0 - 0  Suicidal thoughts 0 0 0 - 0  PHQ-9 Score 12 0 0 - 0  Difficult doing work/chores Somewhat difficult Not difficult at all Not difficult at all - -   GAD 7 : Generalized Anxiety Score 04/03/2019 05/10/2018 08/22/2017 06/13/2017   Nervous, Anxious, on Edge 2 0 1 2  Control/stop worrying 2 1 1 1   Worry too much - different things 2 1 1 2   Trouble relaxing 2 1 1  0  Restless 0 0 0 0  Easily annoyed or irritable 2 1 0 1  Afraid - awful might happen 2 0 0 1  Total GAD 7 Score 12 4 4 7   Anxiety Difficulty Somewhat difficult Somewhat difficult Not difficult at all Not difficult at all   Anhedonia: no Weight changes: no Insomnia: yes hard to fall asleep  Hypersomnia: no Fatigue/loss of energy: yes Feelings of worthlessness: yes Feelings of guilt: yes Impaired concentration/indecisiveness: yes Suicidal ideations: no  Crying spells: no Recent Stressors/Life Changes: yes   Relationship problems: yes   Family stress: yes     Financial stress: yes    Job stress: yes    Recent death/loss: no  INSOMNIA Duration: chronic Satisfied with sleep quality: yes Difficulty falling asleep: yes Difficulty staying asleep: no Waking a few hours after sleep onset: yes Early morning awakenings: no Daytime hypersomnolence: no Wakes feeling refreshed: yes Good sleep hygiene: no Apnea: no Snoring: no Depressed/anxious mood: yes Recent stress: yes Restless legs/nocturnal leg cramps: no Chronic pain/arthritis: no History of sleep study: no Treatments attempted: melatonin, uinsom and benadryl    Relevant past medical, surgical, family and social history reviewed and updated as indicated. Interim medical history since our last visit reviewed. Allergies and medications reviewed and updated.  Review of Systems  Constitutional: Negative.   Respiratory: Negative.   Cardiovascular: Negative.   Musculoskeletal: Negative.   Skin: Negative.   Neurological: Negative.   Psychiatric/Behavioral: Positive for dysphoric mood and sleep disturbance. Negative for agitation, behavioral problems, confusion, decreased concentration, hallucinations, self-injury and suicidal ideas. The patient is nervous/anxious. The patient is not  hyperactive.     Per HPI unless specifically indicated above     Objective:    BP 139/82   Pulse 85   Temp 97.6 F (36.4 C)   Wt 145 lb (65.8 kg)   BMI 22.71 kg/m   Wt Readings from Last 3 Encounters:  04/03/19 145 lb (65.8 kg)  06/06/18 147 lb (66.7 kg)  05/25/18 147 lb (66.7 kg)    Physical Exam Vitals signs and nursing note reviewed.  Constitutional:      General: He is not in acute distress.    Appearance: Normal appearance. He is not ill-appearing, toxic-appearing or diaphoretic.  HENT:     Head: Normocephalic and atraumatic.     Right Ear: External ear normal.     Left Ear: External ear normal.     Nose: Nose normal.     Mouth/Throat:     Mouth: Mucous membranes are moist.     Pharynx: Oropharynx is clear.  Eyes:     General: No scleral icterus.       Right eye: No discharge.        Left eye: No discharge.     Conjunctiva/sclera: Conjunctivae normal.     Pupils: Pupils are equal, round, and reactive to light.  Neck:     Musculoskeletal: Normal range of motion.  Pulmonary:     Effort: Pulmonary effort is normal. No respiratory distress.     Comments: Speaking in full sentences Musculoskeletal: Normal range of motion.  Skin:    Coloration: Skin is not jaundiced or pale.     Findings: No bruising, erythema, lesion or rash.  Neurological:     Mental Status: He is alert and oriented to person, place, and time. Mental status is at baseline.  Psychiatric:        Mood and Affect: Mood normal.        Behavior: Behavior normal.        Thought Content: Thought content normal.        Judgment: Judgment normal.     Results for orders placed or performed in visit on 06/05/18  Measles/Mumps/Rubella Immunity  Result Value Ref Range   Rubella Antibodies, IGG 2.11 Immune >0.99 index   RUBEOLA AB, IGG >300.0 Immune >16.4 AU/mL   MUMPS ABS, IGG 15.0 Immune >10.9 AU/mL      Assessment & Plan:   Problem List Items Addressed This Visit      Cardiovascular and  Mediastinum   Hypertension    Running well at home. Due for labs- will come in ASAP for labs. Refills given today. Call with any concerns.       Relevant Medications   atorvastatin (LIPITOR) 40 MG tablet   amLODipine (NORVASC) 10 MG tablet   Other Relevant Orders   Comprehensive metabolic panel   Microalbumin, Urine Waived   CBC with Differential/Platelet   TSH   UA/M w/rflx Culture, Routine     Other   Hyperlipidemia - Primary    Under good control on current regimen. Continue current regimen. Continue to monitor. Call with any concerns. Refills given. Labs to be drawn ASAP       Relevant Medications  atorvastatin (LIPITOR) 40 MG tablet   amLODipine (NORVASC) 10 MG tablet   Other Relevant Orders   Comprehensive metabolic panel   Lipid Panel w/o Chol/HDL Ratio   CBC with Differential/Platelet   TSH   Depression, major, single episode, moderate (HCC)    In exacerbation. Not interested in preventative medicine. Continue to monitor. Call with any concerns. Will give refill of klonopin. 30 pills should last at least 3 months. If he is taking it more than a couple of times a week- let us know.       Relevant Orders   CBC with Differential/Platelet   TSH   Insomnia    Stable on belsomra. It's working well and he's tolerating it well. Refills given today. Call with any concerns.       Relevant Orders   TSH       Follow up plan: Return in about 6 months (around 10/02/2019) for Physical.    . This visit was completed via FaceTime due to the restrictions of the COVID-19 pandemic. All issues as above were discussed and addressed. Physical exam was done as above through visual confirmation on FaceTime. If it was felt that the patient should be evaluated in the office, they were directed there. The patient verbally consented to this visit. . Location of the patient: home . Location of the provider: home . Those involved with this call:  . Provider: Olevia Perches, DO . CMA:  Tiffany Reel, CMA . Front Desk/Registration: Adela Ports  . Time spent on call: 25 minutes with patient face to face via video conference. More than 50% of this time was spent in counseling and coordination of care. 40 minutes total spent in review of patient's record and preparation of their chart.

## 2019-04-03 NOTE — Assessment & Plan Note (Signed)
Under good control on current regimen. Continue current regimen. Continue to monitor. Call with any concerns. Refills given. Labs to be drawn ASAP.   

## 2019-04-03 NOTE — Assessment & Plan Note (Signed)
Running well at home. Due for labs- will come in ASAP for labs. Refills given today. Call with any concerns.

## 2019-04-03 NOTE — Assessment & Plan Note (Signed)
Stable on belsomra. It's working well and he's tolerating it well. Refills given today. Call with any concerns.

## 2019-04-04 ENCOUNTER — Telehealth: Payer: Self-pay | Admitting: Family Medicine

## 2019-04-04 ENCOUNTER — Telehealth: Payer: Self-pay

## 2019-04-04 NOTE — Telephone Encounter (Signed)
Patient notified that we do not have any coupons at this time.  Copied from [place] [id number]. Topic: General - Other >> Apr 03, 2019  3:04 PM Yvette Rack wrote: Reason for CRM: Pt stated he needs the voucher for Suvorexant (BELSOMRA) 10 MG TABS. Pt requests call back >> Apr 04, 2019  8:44 AM Keene Breath wrote: Patient is calling again regarding his coupon for his medication.  The medication is very expensive and he said the doctor usually gives him a coupon.  Please advise and call patient back to discuss.  CB# [phone]

## 2019-04-04 NOTE — Telephone Encounter (Signed)
Needs belsomra coupon please

## 2019-04-04 NOTE — Telephone Encounter (Signed)
Please let him know he can print one off from offline. Thanks.

## 2019-04-04 NOTE — Telephone Encounter (Signed)
I looked in the closet and did not see any.

## 2019-04-05 ENCOUNTER — Telehealth: Payer: Self-pay | Admitting: Family Medicine

## 2019-04-05 NOTE — Telephone Encounter (Signed)
rx refill Suvorexant [facility]) 10 MG TABS PHARMACY [facility] [address], [place], [place] [address] [phone]  Patient is requesting another voucher for this medication. He did already speak with someone but he wanted to ask again. Patient needs to switch pharmacies due to he found a voucher online where he can get 10 pills even though he usually gets 30.  Without voucher it is $600.  Patient call back [phone]

## 2019-04-05 NOTE — Telephone Encounter (Signed)
Called and left patient a VM asking for him to please return my call.  

## 2019-04-08 MED ORDER — BELSOMRA 10 MG PO TABS: 1.0000 | ORAL_TABLET | Freq: Every evening | ORAL | 5 refills | Status: DC | PRN

## 2019-04-08 NOTE — Telephone Encounter (Signed)
Routing to provider. Patient would like Belsomra sent to [facility] [place] as he states he found a Science writer.

## 2019-04-08 NOTE — Telephone Encounter (Signed)
Prescription cancelled at [facility].

## 2019-04-08 NOTE — Addendum Note (Signed)
Addended by: Valerie Roys on: 04/08/2019 11:13 AM   Modules accepted: Orders

## 2019-04-08 NOTE — Telephone Encounter (Signed)
Patient returned call from [place] . Please advise   Call back [phone]

## 2019-04-08 NOTE — Telephone Encounter (Signed)
Please cancel Rx at [place]. Thanks!

## 2019-04-08 NOTE — Telephone Encounter (Signed)
Called pt back, he is [facility] [place]

## 2019-04-22 ENCOUNTER — Other Ambulatory Visit: Payer: Self-pay

## 2019-04-22 ENCOUNTER — Emergency Department
Admission: EM | Admit: 2019-04-22 | Discharge: 2019-04-22 | Disposition: A | Discharge status: Home / Self Care | Payer: BC Managed Care – PPO | Attending: Emergency Medicine | Admitting: Emergency Medicine

## 2019-04-22 ENCOUNTER — Encounter: Payer: Self-pay | Admitting: Intensive Care

## 2019-04-22 DIAGNOSIS — F17200 Nicotine dependence, unspecified, uncomplicated: Secondary | ICD-10-CM | POA: Insufficient documentation

## 2019-04-22 DIAGNOSIS — M7918 Myalgia, other site: Secondary | ICD-10-CM

## 2019-04-22 DIAGNOSIS — Z79899 Other long term (current) drug therapy: Secondary | ICD-10-CM | POA: Diagnosis not present

## 2019-04-22 DIAGNOSIS — I10 Essential (primary) hypertension: Secondary | ICD-10-CM | POA: Insufficient documentation

## 2019-04-22 MED ORDER — NAPROXEN 500 MG PO TABS: 500.0000 mg | ORAL_TABLET | Freq: Two times a day (BID) | ORAL | Status: DC

## 2019-04-22 MED ORDER — ORPHENADRINE CITRATE ER 100 MG PO TB12: 100.0000 mg | ORAL_TABLET | Freq: Two times a day (BID) | ORAL | 0 refills | Status: DC
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
## 2019-04-22 NOTE — ED Notes (Signed)
Pt is being discharged to home. Pt is AOx4, vss and he does not show any signs of distress. AVS/RX was given and explained to the pt and he verbalized understanding of all information.
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
## 2019-04-24 ENCOUNTER — Ambulatory Visit
Admission: RE | Admit: 2019-04-24 | Discharge: 2019-04-24 | Disposition: A | Discharge status: Home / Self Care | Payer: BC Managed Care – PPO | Source: Ambulatory Visit | Attending: Family Medicine | Admitting: Family Medicine

## 2019-04-24 ENCOUNTER — Ambulatory Visit (INDEPENDENT_AMBULATORY_CARE_PROVIDER_SITE_OTHER): Payer: BC Managed Care – PPO | Admitting: Family Medicine

## 2019-04-24 ENCOUNTER — Encounter: Payer: Self-pay | Admitting: Family Medicine

## 2019-04-24 ENCOUNTER — Other Ambulatory Visit: Payer: Self-pay

## 2019-04-24 ENCOUNTER — Ambulatory Visit
Admission: RE | Admit: 2019-04-24 | Discharge: 2019-04-24 | Disposition: A | Discharge status: Home / Self Care | Payer: BC Managed Care – PPO | Attending: Family Medicine | Admitting: Family Medicine

## 2019-04-24 VITALS — BP 136/87 | Temp 97.6°F

## 2019-04-24 DIAGNOSIS — M542 Cervicalgia: Secondary | ICD-10-CM | POA: Diagnosis not present

## 2019-04-24 DIAGNOSIS — M5442 Lumbago with sciatica, left side: Secondary | ICD-10-CM | POA: Diagnosis not present

## 2019-04-24 DIAGNOSIS — M546 Pain in thoracic spine: Secondary | ICD-10-CM | POA: Insufficient documentation

## 2019-04-24 DIAGNOSIS — S299XXA Unspecified injury of thorax, initial encounter: Secondary | ICD-10-CM | POA: Diagnosis not present

## 2019-04-24 DIAGNOSIS — M545 Low back pain: Secondary | ICD-10-CM | POA: Diagnosis not present

## 2019-04-24 DIAGNOSIS — M549 Dorsalgia, unspecified: Secondary | ICD-10-CM | POA: Diagnosis not present

## 2019-04-24 MED ORDER — BACLOFEN 10 MG PO TABS: 10.0000 mg | ORAL_TABLET | Freq: Three times a day (TID) | ORAL | 0 refills | Status: DC

## 2019-04-24 MED ORDER — IBUPROFEN 800 MG PO TABS: 800.0000 mg | ORAL_TABLET | Freq: Three times a day (TID) | ORAL | 0 refills | Status: DC | PRN

## 2019-04-24 NOTE — Assessment & Plan Note (Signed)
Will check x-rays. Baclofen, ibuprofen and rest. If not better in 3-5 days, will get him into PT.
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
## 2019-04-25 ENCOUNTER — Telehealth: Payer: Self-pay | Admitting: Family Medicine

## 2019-04-25 DIAGNOSIS — M5442 Lumbago with sciatica, left side: Secondary | ICD-10-CM

## 2019-04-25 DIAGNOSIS — M546 Pain in thoracic spine: Secondary | ICD-10-CM

## 2019-04-25 DIAGNOSIS — M542 Cervicalgia: Secondary | ICD-10-CM

## 2019-04-25 NOTE — Telephone Encounter (Signed)
Patient notified

## 2019-04-25 NOTE — Telephone Encounter (Signed)
Will create a new note to return on Monday and he can come pick up at the office  Copied from [place] [id number]. Topic: General - Inquiry >> Apr 24, 2019  3:02 PM Berneta Levins wrote: Reason for CRM:  Pt had a virtual visit with Dr. Wynetta Emery today and he would like a note to be out of work for the remainder of the week.  Returning to work on Monday. Pt can be reached at [phone]

## 2019-04-26 ENCOUNTER — Encounter: Payer: Self-pay | Admitting: Family Medicine
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
## 2019-04-29 NOTE — Telephone Encounter (Signed)
Patient notified

## 2019-04-29 NOTE — Telephone Encounter (Signed)
You can also given him the contact information for PT at [facility] and he can call to make an appointment.

## 2019-04-29 NOTE — Telephone Encounter (Addendum)
Pt following up on request for PT, due to know feeling well.  Also pt thought he would have started PT by now and requesting a note to continue to be out of work until Thursday this week.  Until 05/02/2019.  Pt has not picked up first note yet, so requesting it be changed.  Pt states he is not able to work ue to pain.

## 2019-04-29 NOTE — Telephone Encounter (Signed)
Referral has been placed, but I'm not sure what his expectation may be regarding time-frame to starting since he only called to request referral Friday and is thinking he'd be in working with someone already so please discuss what he can expect time-wise with patient so he has an idea on this. If he would like to be evaluated sooner, can go to [facility] in [place] - they have Orthopedics and Physical Therapy in house.   Admin - Will create new note. Please print and let him know to pick up.    Will route to PCP in this for [facility]

## 2019-04-29 NOTE — Addendum Note (Signed)
Addended by: Merrie Roof E on: 04/29/2019 12:43 PM   Modules accepted: Orders

## 2019-05-04 ENCOUNTER — Other Ambulatory Visit: Payer: Self-pay | Admitting: Family Medicine

## 2019-05-04 NOTE — Telephone Encounter (Signed)
Forwarding medication refill request to PCP for review. 

## 2019-05-07 ENCOUNTER — Ambulatory Visit: Payer: BC Managed Care – PPO | Admitting: Physical Therapy

## 2019-05-08 ENCOUNTER — Other Ambulatory Visit: Payer: Self-pay

## 2019-05-08 ENCOUNTER — Ambulatory Visit (INDEPENDENT_AMBULATORY_CARE_PROVIDER_SITE_OTHER): Payer: BC Managed Care – PPO | Admitting: Family Medicine

## 2019-05-08 ENCOUNTER — Encounter: Payer: Self-pay | Admitting: Family Medicine

## 2019-05-08 VITALS — BP 132/86 | Temp 97.3°F

## 2019-05-08 DIAGNOSIS — F321 Major depressive disorder, single episode, moderate: Secondary | ICD-10-CM | POA: Diagnosis not present

## 2019-05-08 DIAGNOSIS — G4700 Insomnia, unspecified: Secondary | ICD-10-CM | POA: Diagnosis not present

## 2019-05-08 MED ORDER — VENLAFAXINE HCL ER 75 MG PO CP24: 75.0000 mg | ORAL_CAPSULE | Freq: Every day | ORAL | 3 refills | Status: DC

## 2019-05-08 MED ORDER — CLONAZEPAM 0.5 MG PO TABS: 0.5000 mg | ORAL_TABLET | Freq: Every day | ORAL | 0 refills | Status: DC | PRN

## 2019-05-08 NOTE — Progress Notes (Signed)
BP 132/86   Temp (!) 97.3 F (36.3 C)    Subjective:    Patient ID: Shawn Ware, male    DOB: 11/02/1970, 48 y.o.   MRN: [id number]  HPI: Shawn HarveyDarius Rulon Modi is a 48 y.o. male  Chief Complaint  Patient presents with  . Anxiety    Refill on Klonopin   Back is doing a lot better. He has not gotten in with the PT yet.    INSOMNIA- Cannot get his belsomra, notes that he cannot afford- his insurance did not change.  Duration: chronic Satisfied with sleep quality: no Difficulty falling asleep: yes Difficulty staying asleep: no Waking a few hours after sleep onset: yes Early morning awakenings: yes Daytime hypersomnolence: no Wakes feeling refreshed: no Good sleep hygiene: yes Apnea: no Snoring: no Depressed/anxious mood: yes Recent stress: yes Restless legs/nocturnal leg cramps: no Chronic pain/arthritis: no History of sleep study: no Treatments attempted: melatonin, uinsom and benadryl   ANXIETY/STRESS- he has been taking the klonopin every night since he ran out of the belsomra Duration:exacerbated Anxious mood: yes  Excessive worrying: yes Irritability: yes  Sweating: no Nausea: no Palpitations:no Hyperventilation: no Panic attacks: no Agoraphobia: no  Obscessions/compulsions: no Depressed mood: yes Depression screen [facility] 05/08/2019 04/03/2019 05/10/2018 10/13/2017 08/22/2017  Decreased Interest 3 2 0 0 0  Down, Depressed, Hopeless 3 2 0 0 0  PHQ - 2 Score 6 4 0 0 0  Altered sleeping 3 2 0 0 -  Tired, decreased energy 2 2 0 0 -  Change in appetite 2 1 0 0 -  Feeling bad or failure about yourself  2 1 0 0 -  Trouble concentrating 3 1 0 0 -  Moving slowly or fidgety/restless 0 1 0 0 -  Suicidal thoughts 0 0 0 0 -  PHQ-9 Score 18 12 0 0 -  Difficult doing work/chores Somewhat difficult Somewhat difficult Not difficult at all Not difficult at all -   GAD 7 : Generalized Anxiety Score 05/08/2019 04/03/2019 05/10/2018 08/22/2017  Nervous, Anxious, on  Edge 3 2 0 1  Control/stop worrying 3 2 1 1   Worry too much - different things 3 2 1 1   Trouble relaxing 3 2 1 1   Restless 2 0 0 0  Easily annoyed or irritable 2 2 1  0  Afraid - awful might happen 3 2 0 0  Total GAD 7 Score 19 12 4 4   Anxiety Difficulty Somewhat difficult Somewhat difficult Somewhat difficult Not difficult at all   Anhedonia: no Weight changes: no Insomnia: yes hard to fall asleep  Hypersomnia: no Fatigue/loss of energy: yes Feelings of worthlessness: no Feelings of guilt: no Impaired concentration/indecisiveness: no Suicidal ideations: no  Crying spells: no Recent Stressors/Life Changes: yes   Relationship problems: yes   Family stress: yes     Financial stress: yes    Job stress: yes    Recent death/loss: no  Relevant past medical, surgical, family and social history reviewed and updated as indicated. Interim medical history since our last visit reviewed. Allergies and medications reviewed and updated.  Review of Systems  Constitutional: Negative.   Respiratory: Negative.   Cardiovascular: Negative.   Musculoskeletal: Positive for back pain. Negative for arthralgias, gait problem, joint swelling, myalgias, neck pain and neck stiffness.  Skin: Negative.   Neurological: Negative.   Psychiatric/Behavioral: Positive for dysphoric mood and sleep disturbance. Negative for agitation, behavioral problems, confusion, decreased concentration, hallucinations, self-injury and suicidal ideas. The patient is nervous/anxious. The  patient is not hyperactive.     Per HPI unless specifically indicated above     Objective:    BP 132/86   Temp (!) 97.3 F (36.3 C)   Wt Readings from Last 3 Encounters:  04/22/19 145 lb (65.8 kg)  04/03/19 145 lb (65.8 kg)  06/06/18 147 lb (66.7 kg)    Physical Exam Vitals signs and nursing note reviewed.  Constitutional:      General: He is not in acute distress.    Appearance: Normal appearance. He is not ill-appearing,  toxic-appearing or diaphoretic.  HENT:     Head: Normocephalic and atraumatic.     Right Ear: External ear normal.     Left Ear: External ear normal.     Nose: Nose normal.     Mouth/Throat:     Mouth: Mucous membranes are moist.     Pharynx: Oropharynx is clear.  Eyes:     General: No scleral icterus.       Right eye: No discharge.        Left eye: No discharge.     Conjunctiva/sclera: Conjunctivae normal.     Pupils: Pupils are equal, round, and reactive to light.  Neck:     Musculoskeletal: Normal range of motion.  Pulmonary:     Effort: Pulmonary effort is normal. No respiratory distress.     Comments: Speaking in full sentences Musculoskeletal: Normal range of motion.  Skin:    Coloration: Skin is not jaundiced or pale.     Findings: No bruising, erythema, lesion or rash.  Neurological:     Mental Status: He is alert and oriented to person, place, and time. Mental status is at baseline.  Psychiatric:        Mood and Affect: Mood normal.        Behavior: Behavior normal.        Thought Content: Thought content normal.        Judgment: Judgment normal.     Results for orders placed or performed in visit on 06/05/18  Measles/Mumps/Rubella Immunity  Result Value Ref Range   Rubella Antibodies, IGG 2.11 Immune >0.99 index   RUBEOLA AB, IGG >300.0 Immune >16.4 AU/mL   MUMPS ABS, IGG 15.0 Immune >10.9 AU/mL      Assessment & Plan:   Problem List Items Addressed This Visit      Other   Depression, major, single episode, moderate (HCC)    Not under good control. Will start him on effexor and recheck 1 month. Call with any concerns.       Relevant Medications   venlafaxine XR (EFFEXOR XR) 75 MG 24 hr capsule   Other Relevant Orders   [facility]   Insomnia - Primary    Not under good control. Unable to afford belsomra. Will try to get it under better control and will try to get CCM involved to see if we can get it less expensive. Continue to monitor. Call with any  concerns.       Relevant Orders   [facility] Pharmacy       Follow up plan: Return in about 4 weeks (around 06/05/2019).   . This visit was completed via FaceTime due to the restrictions of the COVID-19 pandemic. All issues as above were discussed and addressed. Physical exam was done as above through visual confirmation on FaceTime. If it was felt that the patient should be evaluated in the office, they were directed there. The patient verbally consented to this visit. Marland Kitchen  Location of the patient: home . Location of the provider: home . Those involved with this call:  . Provider: Park Liter, DO . CMA: Tiffany Reel, CMA . Front Desk/Registration: Don Perking  . Time spent on call: 25 minutes with patient face to face via video conference. More than 50% of this time was spent in counseling and coordination of care. 40 minutes total spent in review of patient's record and preparation of their chart.

## 2019-05-08 NOTE — Assessment & Plan Note (Signed)
Not under good control. Unable to afford belsomra. Will try to get it under better control and will try to get CCM involved to see if we can get it less expensive. Continue to monitor. Call with any concerns.

## 2019-05-08 NOTE — Assessment & Plan Note (Signed)
Not under good control. Will start him on effexor and recheck 1 month. Call with any concerns.

## 2019-05-10 ENCOUNTER — Encounter: Payer: BC Managed Care – PPO | Admitting: Physical Therapy

## 2019-05-10 ENCOUNTER — Telehealth: Payer: Self-pay | Admitting: Family Medicine

## 2019-05-10 NOTE — Chronic Care Management (AMB) (Signed)
  Care Management   Note  05/10/2019 Name: Shawn Ware MRN: [id number] DOB: 08-25-1970  Shawn Ware is a 48 y.o. year old male who is a primary care patient of Valerie Roys, DO. I reached out to [employer] by phone today in response to a referral sent by Shawn Ware [facility] health plan.    Shawn Ware was given information about care management services today including:  1. Care management services include personalized support from designated clinical staff supervised by his physician, including individualized plan of care and coordination with other care providers 2. 24/7 contact phone numbers for assistance for urgent and routine care needs. 3. The patient may stop care management services at any time by phone call to the office staff.  Patient agreed to services and verbal consent obtained.   Follow up plan: Telephone appointment with CCM team member scheduled for:07/02/2019  Glenna Durand LPN Nurse Health Advisor . [place]  [email or web address] ??[phone]

## 2019-05-13 ENCOUNTER — Encounter: Payer: BC Managed Care – PPO | Admitting: Physical Therapy

## 2019-05-15 ENCOUNTER — Encounter: Payer: Self-pay | Admitting: Physical Therapy

## 2019-05-15 ENCOUNTER — Other Ambulatory Visit: Payer: Self-pay

## 2019-05-15 ENCOUNTER — Ambulatory Visit: Payer: BC Managed Care – PPO | Attending: Family Medicine | Admitting: Physical Therapy

## 2019-05-15 DIAGNOSIS — M25612 Stiffness of left shoulder, not elsewhere classified: Secondary | ICD-10-CM | POA: Diagnosis not present

## 2019-05-15 DIAGNOSIS — M542 Cervicalgia: Secondary | ICD-10-CM | POA: Diagnosis not present

## 2019-05-15 DIAGNOSIS — M25611 Stiffness of right shoulder, not elsewhere classified: Secondary | ICD-10-CM | POA: Diagnosis not present
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
## 2019-05-16 ENCOUNTER — Encounter: Payer: BC Managed Care – PPO | Admitting: Physical Therapy

## 2019-05-21 ENCOUNTER — Other Ambulatory Visit: Payer: Self-pay

## 2019-05-21 ENCOUNTER — Encounter: Payer: Self-pay | Admitting: Physical Therapy

## 2019-05-21 ENCOUNTER — Ambulatory Visit: Payer: BC Managed Care – PPO | Admitting: Physical Therapy

## 2019-05-21 DIAGNOSIS — M542 Cervicalgia: Secondary | ICD-10-CM | POA: Diagnosis not present

## 2019-05-21 DIAGNOSIS — M25611 Stiffness of right shoulder, not elsewhere classified: Secondary | ICD-10-CM

## 2019-05-21 DIAGNOSIS — M25612 Stiffness of left shoulder, not elsewhere classified: Secondary | ICD-10-CM | POA: Diagnosis not present
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
## 2019-05-22 ENCOUNTER — Telehealth: Payer: Self-pay | Admitting: Family Medicine

## 2019-05-22 NOTE — Telephone Encounter (Signed)
Yes, I see the referral. Someone from my team outreached him to consent and schedule, and looks like scheduled my initial outreach in January.   If I don't get to this today, will definitely work on this next week

## 2019-05-22 NOTE — Telephone Encounter (Signed)
Catie, is there anything you can do to help with this? I put in a referral last I saw him, but he's clearly concerned.

## 2019-05-22 NOTE — Telephone Encounter (Signed)
Routing to provider  

## 2019-05-22 NOTE — Telephone Encounter (Signed)
Patient states Suvorexant (BELSOMRA) 10 MG TABS is expensive requesting an alternate for now, please advise  [place], [place].

## 2019-05-22 NOTE — Telephone Encounter (Signed)
Pt calling back to check status. Please advise  °

## 2019-05-24 NOTE — Telephone Encounter (Signed)
Patient calling back about request. Patient aware office closed until Monday. Patient will call back.

## 2019-05-28 ENCOUNTER — Ambulatory Visit: Payer: Self-pay | Admitting: Pharmacist

## 2019-05-28 ENCOUNTER — Telehealth: Payer: Self-pay

## 2019-05-28 ENCOUNTER — Ambulatory Visit: Payer: BC Managed Care – PPO | Admitting: Physical Therapy

## 2019-05-28 DIAGNOSIS — G4700 Insomnia, unspecified: Secondary | ICD-10-CM

## 2019-05-28 NOTE — Patient Instructions (Signed)
Visit Information  Goals Addressed            This Visit's Progress     Patient Stated   . "I can't afford a medication" (pt-stated)       Current Barriers:  . Polypharmacy; complex patient with multiple comorbidities including insomnia, depression, anxiety o Insomnia: managed on Belsomra 10 mg QPM PRN; notes that his copay is >$500 for this medication. Notes he previously was using a copay card to lower the cost.  o Depression/anxiety: venlafaxine 75 mg daily, clonazepam 0.5 mg daily   Pharmacist Clinical Goal(s):  Marland Kitchen Over the next 30 days, patient will work with PharmD and provider towards optimized medication management  Interventions: . Comprehensive medication review performed; medication list updated in electronic medical record . Reviewed copay card, provided instructions on how to download a Belsomra copay card. Patient will work on doing this. However, card only has a maximum benefit of $150, so copay would still be expensive.  . Reviewed [place] website. Per website, Belsomra has step criteria - patient must try 2 of eszopiclone, zolpidem or zaleplon prior to approval or Belsomra. Contacted phone rep to confirm this, however, I was on hold for 15 minutes without speaking to anyone. Will discuss the above w/ Dr. Wynetta Emery.   Patient Self Care Activities:  . Patient will take medications as prescribed  Initial goal documentation        The patient verbalized understanding of instructions provided today and declined a print copy of patient instruction materials.   Plan: - Will collaborate w/ Dr. Wynetta Emery as above - Will f/u with patient within the next week regarding medication access options   Catie Darnelle Maffucci, PharmD, [place] [phone]

## 2019-05-28 NOTE — Chronic Care Management (AMB) (Signed)
Chronic Care Management   Note  05/28/2019 Name: Shawn Ware MRN: [id number] DOB: 1971-01-03   Subjective:  Shawn Ware is a 48 y.o. year old male who is a primary care patient of Dorcas Carrow, DO. The CCM team was consulted for assistance with chronic disease management and care coordination needs.    Contacted patient for medication management needs today.   Review of patient status, including review of consultants reports, laboratory and other test data, was performed as part of comprehensive evaluation and provision of chronic care management services.   Objective:  Lab Results  Component Value Date   CREATININE 0.82 05/10/2018   CREATININE 1.07 08/01/2017   CREATININE 0.93 11/09/2016    Lab Results  Component Value Date   HGBA1C 5.0 08/01/2017       Component Value Date/Time   CHOL 204 (H) 11/09/2016 1111   CHOL 140 05/23/2016 1034   TRIG 153 (H) 11/09/2016 1111   TRIG 140 05/23/2016 1034   HDL 54 11/09/2016 1111   VLDL 28 05/23/2016 1034   LDLCALC 119 (H) 11/09/2016 1111    Clinical ASCVD: No  The 10-year ASCVD risk score Denman George DC Jr., et al., 2013) is: 2.3%   Values used to calculate the score:     Age: 58 years     Sex: Male     Is Non-Hispanic African American: No     Diabetic: No     Tobacco smoker: No     Systolic Blood Pressure: 132 mmHg     Is BP treated: No     HDL Cholesterol: 54 mg/dL     Total Cholesterol: 204 mg/dL    BP Readings from Last 3 Encounters:  05/08/19 132/86  04/24/19 136/87  04/22/19 136/88    Allergies  Allergen Reactions  . Penicillins   . Lisinopril Rash    Medications Reviewed Today    Reviewed by Lourena Simmonds, [facility] (Pharmacist) on 05/28/19 at [phone]  Med List Status: <None>  Medication Order Taking? Sig Documenting Provider Last Dose Status Informant  baclofen (LIORESAL) 10 MG tablet [id number] No Take 1 tablet (10 mg total) by mouth 3 (three) times daily.  Patient not taking: Reported on  05/28/2019   Dorcas Carrow, DO Not Taking Active   clonazePAM (KLONOPIN) 0.5 MG tablet [id number] Yes Take 1 tablet (0.5 mg total) by mouth daily as needed for anxiety. Dorcas Carrow, DO Taking Active            Med Note Lourena Simmonds   Tue May 28, 2019  8:41 AM) Taking daily   ibuprofen (ADVIL) 800 MG tablet [id number] No Take 1 tablet (800 mg total) by mouth every 8 (eight) hours as needed.  Patient not taking: Reported on 05/28/2019   Dorcas Carrow, DO Not Taking Active   Suvorexant (BELSOMRA) 10 MG TABS [id number] No Take 1 tablet by mouth at bedtime as needed.  Patient not taking: Reported on 05/28/2019   Dorcas Carrow, DO Not Taking Active   tadalafil (ADCIRCA/CIALIS) 20 MG tablet [id number] Yes Take 0.5-1 tablets (10-20 mg total) by mouth every other day as needed for erectile dysfunction. [name], [place] Taking Active   venlafaxine XR (EFFEXOR XR) 75 MG 24 hr capsule [id number] Yes Take 1 capsule (75 mg total) by mouth daily with breakfast. Dorcas Carrow, DO Taking Active            Assessment:   Goals Addressed  This Visit's Progress     Patient Stated   . "I can't afford a medication" (pt-stated)       Current Barriers:  . Polypharmacy; complex patient with multiple comorbidities including insomnia, depression, anxiety o Insomnia: managed on Belsomra 10 mg QPM PRN; notes that his copay is >$500 for this medication. Notes he previously was using a copay card to lower the cost.  o Depression/anxiety: venlafaxine 75 mg daily, clonazepam 0.5 mg daily   Pharmacist Clinical Goal(s):  Marland Kitchen Over the next 30 days, patient will work with PharmD and provider towards optimized medication management  Interventions: . Comprehensive medication review performed; medication list updated in electronic medical record . Reviewed copay card, provided instructions on how to download a Belsomra copay card. Patient will work on doing this. However, card only  has a maximum benefit of $150, so copay would still be expensive.  . Reviewed [place] website. Per website, Belsomra has step criteria - patient must try 2 of eszopiclone, zolpidem or zaleplon prior to approval or Belsomra. Contacted phone rep to confirm this, however, I was on hold for 15 minutes without speaking to anyone. Will discuss the above w/ Dr. Wynetta Emery.   Patient Self Care Activities:  . Patient will take medications as prescribed  Initial goal documentation        Plan: - Will collaborate w/ Dr. Wynetta Emery as above - Will f/u with patient within the next week regarding medication access options   Catie Darnelle Maffucci, PharmD, [place] [phone]

## 2019-05-28 NOTE — Telephone Encounter (Signed)
See CCM note today. Step therapy required

## 2019-05-29 MED ORDER — ZOLPIDEM TARTRATE 5 MG PO TABS: 5.0000 mg | ORAL_TABLET | Freq: Every evening | ORAL | 2 refills | Status: DC | PRN

## 2019-05-29 NOTE — Telephone Encounter (Signed)
Ambien sent to his pharmacy. Please let him know.

## 2019-05-29 NOTE — Addendum Note (Signed)
Addended by: Valerie Roys on: 05/29/2019 08:27 AM   Modules accepted: Orders

## 2019-05-29 NOTE — Telephone Encounter (Signed)
Patient notified

## 2019-05-30 ENCOUNTER — Ambulatory Visit: Payer: BC Managed Care – PPO | Admitting: Physical Therapy

## 2019-06-03 ENCOUNTER — Encounter: Payer: Self-pay | Admitting: Physical Therapy

## 2019-06-03 ENCOUNTER — Other Ambulatory Visit: Payer: Self-pay

## 2019-06-03 ENCOUNTER — Ambulatory Visit: Payer: BC Managed Care – PPO | Attending: Family Medicine | Admitting: Physical Therapy

## 2019-06-03 DIAGNOSIS — M25611 Stiffness of right shoulder, not elsewhere classified: Secondary | ICD-10-CM

## 2019-06-03 DIAGNOSIS — M25612 Stiffness of left shoulder, not elsewhere classified: Secondary | ICD-10-CM | POA: Diagnosis not present

## 2019-06-03 DIAGNOSIS — M542 Cervicalgia: Secondary | ICD-10-CM
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
## 2019-06-04 ENCOUNTER — Encounter: Payer: Self-pay | Admitting: Family Medicine

## 2019-06-04 ENCOUNTER — Ambulatory Visit (INDEPENDENT_AMBULATORY_CARE_PROVIDER_SITE_OTHER): Payer: BC Managed Care – PPO | Admitting: Family Medicine

## 2019-06-04 DIAGNOSIS — F321 Major depressive disorder, single episode, moderate: Secondary | ICD-10-CM

## 2019-06-04 DIAGNOSIS — G4700 Insomnia, unspecified: Secondary | ICD-10-CM | POA: Diagnosis not present

## 2019-06-04 MED ORDER — ESZOPICLONE 2 MG PO TABS: 2.0000 mg | ORAL_TABLET | Freq: Every evening | ORAL | 0 refills | Status: DC | PRN

## 2019-06-04 NOTE — Progress Notes (Signed)
Pt stated he would call back to schedule appointment.

## 2019-06-04 NOTE — Assessment & Plan Note (Signed)
Did not start his effexor. Encouraged him to pick it up. Call with any concerns. Recheck 1 month.

## 2019-06-04 NOTE — Assessment & Plan Note (Signed)
Had palpitations on ambien. Will try lunesta. Call with any concerns. Encouraged him to take his effexor.

## 2019-06-04 NOTE — Progress Notes (Signed)
BP 132/79    Temp 98.3 F (36.8 C)    Wt 146 lb (66.2 kg)    BMI 22.87 kg/m    Subjective:    Patient ID: Shawn Ware, male    DOB: Jul 31, 1970, 48 y.o.   MRN: [id number]  HPI: Shawn Ware is a 48 y.o. male  Chief Complaint  Patient presents with   Insomnia    Ambien causes heart palpitations, would like something different   Depression   DEPRESSION- has not been taking the effexor Mood status: stable Satisfied with current treatment?: not taking it Symptom severity: moderate  Duration of current treatment : not on anything Medication compliance: poor compliance Psychotherapy/counseling: no  Previous psychiatric medications: clonazepam Depressed mood: yes Anxious mood: yes Anhedonia: no Significant weight loss or gain: no Insomnia: yes hard to fall asleep Fatigue: yes Feelings of worthlessness or guilt: yes Impaired concentration/indecisiveness: no Suicidal ideations: no Hopelessness: no Crying spells: no Depression screen [facility] 2/9 06/04/2019 05/08/2019 04/03/2019 05/10/2018 10/13/2017  Decreased Interest 3 3 2  0 0  Down, Depressed, Hopeless 2 3 2  0 0  PHQ - 2 Score 5 6 4  0 0  Altered sleeping 3 3 2  0 0  Tired, decreased energy 2 2 2  0 0  Change in appetite 2 2 1  0 0  Feeling bad or failure about yourself  3 2 1  0 0  Trouble concentrating 1 3 1  0 0  Moving slowly or fidgety/restless 1 0 1 0 0  Suicidal thoughts 0 0 0 0 0  PHQ-9 Score 17 18 12  0 0  Difficult doing work/chores Somewhat difficult Somewhat difficult Somewhat difficult Not difficult at all Not difficult at all   INSOMNIA Duration: chronic Satisfied with sleep quality: no Difficulty falling asleep: yes Difficulty staying asleep: yes Waking a few hours after sleep onset: yes Early morning awakenings: no Daytime hypersomnolence: no Wakes feeling refreshed: no Good sleep hygiene: no Apnea: no Snoring: no Depressed/anxious mood: yes Recent stress: yes Restless legs/nocturnal leg  cramps: no Chronic pain/arthritis: no History of sleep study: no Treatments attempted: belsomra, clonazepam, melatonin, uinsom, benadryl and ambien   Relevant past medical, surgical, family and social history reviewed and updated as indicated. Interim medical history since our last visit reviewed. Allergies and medications reviewed and updated.  Review of Systems  Constitutional: Negative.   Respiratory: Negative.   Cardiovascular: Negative.   Musculoskeletal: Negative.   Skin: Negative.   Psychiatric/Behavioral: Positive for dysphoric mood and sleep disturbance. Negative for agitation, behavioral problems, confusion, decreased concentration, hallucinations, self-injury and suicidal ideas. The patient is nervous/anxious. The patient is not hyperactive.     Per HPI unless specifically indicated above     Objective:    BP 132/79    Temp 98.3 F (36.8 C)    Wt 146 lb (66.2 kg)    BMI 22.87 kg/m   Wt Readings from Last 3 Encounters:  06/04/19 146 lb (66.2 kg)  04/22/19 145 lb (65.8 kg)  04/03/19 145 lb (65.8 kg)    Physical Exam Vitals signs and nursing note reviewed.  Constitutional:      General: He is not in acute distress.    Appearance: Normal appearance. He is not ill-appearing, toxic-appearing or diaphoretic.  HENT:     Head: Normocephalic and atraumatic.     Right Ear: External ear normal.     Left Ear: External ear normal.     Nose: Nose normal.     Mouth/Throat:     Mouth:  Mucous membranes are moist.     Pharynx: Oropharynx is clear.  Eyes:     General: No scleral icterus.       Right eye: No discharge.        Left eye: No discharge.     Conjunctiva/sclera: Conjunctivae normal.     Pupils: Pupils are equal, round, and reactive to light.  Neck:     Musculoskeletal: Normal range of motion.  Pulmonary:     Effort: Pulmonary effort is normal. No respiratory distress.     Comments: Speaking in full sentences Musculoskeletal: Normal range of motion.  Skin:     Coloration: Skin is not jaundiced or pale.     Findings: No bruising, erythema, lesion or rash.  Neurological:     Mental Status: He is alert and oriented to person, place, and time. Mental status is at baseline.  Psychiatric:        Mood and Affect: Mood normal.        Behavior: Behavior normal.        Thought Content: Thought content normal.        Judgment: Judgment normal.     Results for orders placed or performed in visit on 06/05/18  Measles/Mumps/Rubella Immunity  Result Value Ref Range   Rubella Antibodies, IGG 2.11 Immune >0.99 index   RUBEOLA AB, IGG >300.0 Immune >16.4 AU/mL   MUMPS ABS, IGG 15.0 Immune >10.9 AU/mL      Assessment & Plan:   Problem List Items Addressed This Visit      Other   Depression, major, single episode, moderate (HCC)    Did not start his effexor. Encouraged him to pick it up. Call with any concerns. Recheck 1 month.       Insomnia    Had palpitations on ambien. Will try lunesta. Call with any concerns. Encouraged him to take his effexor.           Follow up plan: Return in about 4 weeks (around 07/02/2019) for follow up mood and sleep.     This visit was completed via FaceTime due to the restrictions of the COVID-19 pandemic. All issues as above were discussed and addressed. Physical exam was done as above through visual confirmation on FaceTime. If it was felt that the patient should be evaluated in the office, they were directed there. The patient verbally consented to this visit.  Location of the patient: home  Location of the provider: work  Those involved with this call:   Provider: Olevia Perches, DO  CMA: Verdon Cummins, CMA  Front Desk/Registration: Adela Ports   Time spent on call: 25 minutes with patient face to face via video conference. More than 50% of this time was spent in counseling and coordination of care. 40 minutes total spent in review of patient's record and preparation of their chart.

## 2019-06-05 ENCOUNTER — Ambulatory Visit: Payer: BC Managed Care – PPO | Admitting: Physical Therapy

## 2019-06-10 ENCOUNTER — Ambulatory Visit: Payer: BC Managed Care – PPO | Admitting: Physical Therapy

## 2019-06-13 ENCOUNTER — Ambulatory Visit: Payer: BC Managed Care – PPO | Admitting: Physical Therapy

## 2019-06-13 ENCOUNTER — Other Ambulatory Visit: Payer: Self-pay | Admitting: Family Medicine

## 2019-06-14 NOTE — Telephone Encounter (Signed)
Pt called stating he is completely out of this medication. Please advise.

## 2019-06-14 NOTE — Telephone Encounter (Signed)
Patient notified and verbalized understanding. 

## 2019-06-14 NOTE — Telephone Encounter (Signed)
Please tell him I will give him 1 rx of this. It needs to last at least 3 months. And he need to start his effexor.

## 2019-06-24 ENCOUNTER — Other Ambulatory Visit: Payer: Self-pay | Admitting: Family Medicine

## 2019-06-24 NOTE — Telephone Encounter (Signed)
Medication Refill - Medication: eszopiclone (LUNESTA) 2 MG TABS tablet  Has the patient contacted their pharmacy? Yes - no refills left (Agent: If no, request that the patient contact the pharmacy for the refill.) (Agent: If yes, when and what did the pharmacy advise?)  Preferred Pharmacy (with phone number or street name):  [facility] - [place], [place]. Phone:  [phone]  Fax:  [phone]     Agent: Please be advised that RX refills may take up to 3 business days. We ask that you follow-up with your pharmacy.

## 2019-06-24 NOTE — Telephone Encounter (Signed)
Routing to provider  

## 2019-06-24 NOTE — Telephone Encounter (Signed)
Requested medication (s) are due for refill today: yes  Requested medication (s) are on the active medication list: yes  Last refill:  06/04/2019  Future visit scheduled: yes  Notes to clinic: not delegated    Requested Prescriptions  Pending Prescriptions Disp Refills   eszopiclone (LUNESTA) 2 MG TABS tablet 30 tablet 0    Sig: Take 1 tablet (2 mg total) by mouth at bedtime as needed for sleep. Take immediately before bedtime      Not Delegated - Psychiatry:  Anxiolytics/Hypnotics Failed - 06/24/2019 10:23 AM      Failed - This refill cannot be delegated      Failed - Urine Drug Screen completed in last 360 days.      Passed - Valid encounter within last 6 months    Recent Outpatient Visits           2 weeks ago Depression, major, single episode, moderate ([place])   [place], [name], DO   1 month ago Insomnia, unspecified type   [place], [place], DO   2 months ago Neck pain   [place], [place], DO   2 months ago Mixed hyperlipidemia   [facility] [place], [place], DO   1 year ago Erectile dysfunction, unspecified erectile dysfunction type   [facility] Volney American, PA-C       Future Appointments             In 3 months Wynetta Emery, Barb Merino, DO [employer], [facility]

## 2019-06-25 ENCOUNTER — Other Ambulatory Visit: Payer: Self-pay | Admitting: Family Medicine

## 2019-06-26 ENCOUNTER — Other Ambulatory Visit: Payer: Self-pay | Admitting: Family Medicine

## 2019-06-26 NOTE — Telephone Encounter (Signed)
Unsure what to tell patient message is blank?

## 2019-06-26 NOTE — Telephone Encounter (Signed)
Requested medication (s) are due for refill today: yes  Requested medication (s) are on the active medication list: yes  Last refill:  06/04/2019  #30-0 refills  Future visit scheduled: yes  Notes to clinic: Not delegated    Requested Prescriptions  Pending Prescriptions Disp Refills   eszopiclone (LUNESTA) 2 MG TABS tablet [Pharmacy Med Name: ESZOPICLONE 2 MG TAB] 30 tablet     Sig: TAKE 1 TABLET BY MOUTH AT BEDTIME AS NEEDED SLEEP AS DIRECTED      Not Delegated - Psychiatry:  Anxiolytics/Hypnotics Failed - 06/26/2019  1:33 PM      Failed - This refill cannot be delegated      Failed - Urine Drug Screen completed in last 360 days.      Passed - Valid encounter within last 6 months    Recent Outpatient Visits           3 weeks ago Depression, major, single episode, moderate ([place])   [place], [name], DO   1 month ago Insomnia, unspecified type   [place], [place], DO   2 months ago Neck pain   [place], [place], DO   2 months ago Mixed hyperlipidemia   [facility] [place], [place], DO   1 year ago Erectile dysfunction, unspecified erectile dysfunction type   [facility] Volney American, PA-C       Future Appointments             In 3 months Wynetta Emery, Barb Merino, DO [employer], [facility]

## 2019-07-01 NOTE — Telephone Encounter (Signed)
Nothing needs to be told to the patient. He is not due for the RX until his appointment on 07/05/19.

## 2019-07-01 NOTE — Telephone Encounter (Signed)
Unsure what to tell Patient message is blank?

## 2019-07-02 ENCOUNTER — Telehealth: Payer: Self-pay

## 2019-07-05 ENCOUNTER — Encounter: Payer: Self-pay | Admitting: Family Medicine

## 2019-07-05 ENCOUNTER — Ambulatory Visit (INDEPENDENT_AMBULATORY_CARE_PROVIDER_SITE_OTHER): Payer: BC Managed Care – PPO | Admitting: Family Medicine

## 2019-07-05 ENCOUNTER — Other Ambulatory Visit: Payer: Self-pay

## 2019-07-05 VITALS — BP 132/81 | Temp 97.6°F | Wt 145.0 lb

## 2019-07-05 DIAGNOSIS — G4700 Insomnia, unspecified: Secondary | ICD-10-CM

## 2019-07-05 DIAGNOSIS — F321 Major depressive disorder, single episode, moderate: Secondary | ICD-10-CM

## 2019-07-05 MED ORDER — VENLAFAXINE HCL ER 75 MG PO CP24: 75.0000 mg | ORAL_CAPSULE | Freq: Every day | ORAL | 1 refills | Status: DC

## 2019-07-05 MED ORDER — ESZOPICLONE 3 MG PO TABS: 3.0000 mg | ORAL_TABLET | Freq: Every evening | ORAL | 2 refills | Status: DC | PRN

## 2019-07-05 MED ORDER — CLONAZEPAM 0.5 MG PO TABS: ORAL_TABLET | ORAL | 0 refills | Status: DC

## 2019-07-05 NOTE — Assessment & Plan Note (Signed)
Has started his effexor and feels like it's helping. Continue effexor. Due for refill on his klonopin in 9 days- out today. Discussed again the importance of not taking that medicine every day. I would like his 30 day supply to last at least 2 months, preferably 3.

## 2019-07-05 NOTE — Progress Notes (Signed)
BP 132/81   Temp 97.6 F (36.4 C)   Wt 145 lb (65.8 kg)   BMI 22.71 kg/m    Subjective:    Patient ID: Shawn Ware, male    DOB: [birth year], 49 y.o.   MRN: [id number]  HPI: Shawn Ware is a 49 y.o. male  Chief Complaint  Patient presents with  . Insomnia    Patient needs a refill on Lunesta   INSOMNIA Duration: chronic Satisfied with sleep quality: yes Difficulty falling asleep: no Difficulty staying asleep: no Waking a few hours after sleep onset: yes Early morning awakenings: yes Daytime hypersomnolence: no Wakes feeling refreshed: no Good sleep hygiene: no Apnea: no Snoring: no Depressed/anxious mood: yes Recent stress: yes Restless legs/nocturnal leg cramps: no Chronic pain/arthritis: no History of sleep study: no Treatments attempted: clonazepam, belsomra, melatonin, uinsom, benadryl and ambien   DEPRESSION- started taking the effexor. He notes that it is helping and he is feeling better.   Relevant past medical, surgical, family and social history reviewed and updated as indicated. Interim medical history since our last visit reviewed. Allergies and medications reviewed and updated.  Review of Systems  Constitutional: Negative.   Respiratory: Negative.   Cardiovascular: Negative.   Musculoskeletal: Negative.   Skin: Negative.   Neurological: Negative.   Psychiatric/Behavioral: Positive for agitation, dysphoric mood and sleep disturbance. Negative for behavioral problems, confusion, decreased concentration, hallucinations, self-injury and suicidal ideas. The patient is nervous/anxious. The patient is not hyperactive.     Per HPI unless specifically indicated above     Objective:    BP 132/81   Temp 97.6 F (36.4 C)   Wt 145 lb (65.8 kg)   BMI 22.71 kg/m   Wt Readings from Last 3 Encounters:  07/05/19 145 lb (65.8 kg)  06/04/19 146 lb (66.2 kg)  04/22/19 145 lb (65.8 kg)    Physical Exam Vitals and nursing note reviewed.    Constitutional:      General: He is not in acute distress.    Appearance: Normal appearance. He is not ill-appearing, toxic-appearing or diaphoretic.  HENT:     Head: Normocephalic and atraumatic.     Right Ear: External ear normal.     Left Ear: External ear normal.     Nose: Nose normal.     Mouth/Throat:     Mouth: Mucous membranes are moist.     Pharynx: Oropharynx is clear.  Eyes:     General: No scleral icterus.       Right eye: No discharge.        Left eye: No discharge.     Conjunctiva/sclera: Conjunctivae normal.     Pupils: Pupils are equal, round, and reactive to light.  Pulmonary:     Effort: Pulmonary effort is normal. No respiratory distress.     Comments: Speaking in full sentences Musculoskeletal:        General: Normal range of motion.     Cervical back: Normal range of motion.  Skin:    Coloration: Skin is not jaundiced or pale.     Findings: No bruising, erythema, lesion or rash.  Neurological:     Mental Status: He is alert and oriented to person, place, and time. Mental status is at baseline.  Psychiatric:        Mood and Affect: Mood normal.        Behavior: Behavior normal.        Thought Content: Thought content normal.  Judgment: Judgment normal.     Results for orders placed or performed in visit on 06/05/18  Measles/Mumps/Rubella Immunity  Result Value Ref Range   Rubella Antibodies, IGG 2.11 Immune >0.99 index   RUBEOLA AB, IGG >300.0 Immune >16.4 AU/mL   MUMPS ABS, IGG 15.0 Immune >10.9 AU/mL      Assessment & Plan:   Problem List Items Addressed This Visit      Other   Depression, major, single episode, moderate ([place])    Has started his effexor and feels like it's helping. Continue effexor. Due for refill on his klonopin in 9 days- out today. Discussed again the importance of not taking that medicine every day. I would like his 30 day supply to last at least 2 months, preferably 3.       Relevant Medications   venlafaxine XR  (EFFEXOR XR) 75 MG 24 hr capsule   Insomnia - Primary    Doing OK on the lunesta, although waking up about 3AM. Will increase to 3mg  and recheck 3 months. Call with any concerns.           Follow up plan: Return in about 3 months (around 10/03/2019).    . This visit was completed via Doximity due to the restrictions of the COVID-19 pandemic. All issues as above were discussed and addressed. Physical exam was done as above through visual confirmation on Doximity. If it was felt that the patient should be evaluated in the office, they were directed there. The patient verbally consented to this visit. . Location of the patient: home . Location of the provider: home . Those involved with this call:  . Provider: Park Liter, DO . CMA: Tiffany Reel, CMA . Front Desk/Registration: Don Perking  . Time spent on call: 25 minutes with patient face to face via video conference. More than 50% of this time was spent in counseling and coordination of care. 40 minutes total spent in review of patient's record and preparation of their chart.

## 2019-07-05 NOTE — Assessment & Plan Note (Signed)
Doing OK on the lunesta, although waking up about 3AM. Will increase to 3mg  and recheck 3 months. Call with any concerns.

## 2019-08-18 ENCOUNTER — Other Ambulatory Visit: Payer: Self-pay | Admitting: Family Medicine

## 2019-08-19 ENCOUNTER — Other Ambulatory Visit: Payer: Self-pay | Admitting: Family Medicine

## 2019-08-19 MED ORDER — ESZOPICLONE 3 MG PO TABS: 3.0000 mg | ORAL_TABLET | Freq: Every evening | ORAL | 0 refills | Status: DC | PRN

## 2019-08-19 NOTE — Telephone Encounter (Signed)
Routing to provider  

## 2019-08-19 NOTE — Telephone Encounter (Signed)
Requested medication (s) are due for refill today -no  Requested medication (s) are on the active medication list-yes  Future visit scheduled - yes  Last refill: 07/05/19 1RF  Notes to clinic: See note: Pt stated he needs a short supply for his eszopiclone (LUNESTA) 2 MG TABS tablet.  He stated about 5 tablets were damaged by moisture/water. He needs a few to get him through to his next refill. Please advise. Non-delegated Rx request  Requested Prescriptions  Pending Prescriptions Disp Refills   Eszopiclone 3 MG TABS 30 tablet 2    Sig: Take 1 tablet (3 mg total) by mouth at bedtime as needed. Take immediately before bedtime      Not Delegated - Psychiatry:  Anxiolytics/Hypnotics Failed - 08/19/2019  8:19 AM      Failed - This refill cannot be delegated      Failed - Urine Drug Screen completed in last 360 days.      Passed - Valid encounter within last 6 months    Recent Outpatient Visits           1 month ago Insomnia, unspecified type   [facility] [place], [name], DO   2 months ago Depression, major, single episode, moderate (HCC)   [facility] [place], [name], DO   3 months ago Insomnia, unspecified type   [facility], [place], DO   3 months ago Neck pain   [facility] [place], [place], DO   4 months ago Mixed hyperlipidemia   [facility], [place], DO       Future Appointments             In 1 month [name], [name], DO [facility]                Requested Prescriptions  Pending Prescriptions Disp Refills   Eszopiclone 3 MG TABS 30 tablet 2    Sig: Take 1 tablet (3 mg total) by mouth at bedtime as needed. Take immediately before bedtime      Not Delegated - Psychiatry:  Anxiolytics/Hypnotics Failed - 08/19/2019  8:19 AM      Failed - This refill cannot be delegated      Failed - Urine Drug Screen completed in last 360 days.      Passed - Valid encounter  within last 6 months    Recent Outpatient Visits           1 month ago Insomnia, unspecified type   [facility] [place], [name], DO   2 months ago Depression, major, single episode, moderate (HCC)   [facility] [place], [name], DO   3 months ago Insomnia, unspecified type   [facility] [place], [place], DO   3 months ago Neck pain   [facility] [place], [place], DO   4 months ago Mixed hyperlipidemia   [employer], [place], DO       Future Appointments             In 1 month [name], [name], DO [facility] [facility], [facility]

## 2019-08-19 NOTE — Telephone Encounter (Signed)
Pt stated he needs a short supply for his eszopiclone (LUNESTA) 2 MG TABS tablet  . He stated a bout 5 tablets were damaged by moisture/water. He needs a few to get him through to his next refill. Please advise.  [facility] - [place], [place] - [address] Phone:  [phone]  Fax:  [phone]

## 2019-08-20 ENCOUNTER — Other Ambulatory Visit: Payer: Self-pay | Admitting: Family Medicine

## 2019-09-04 ENCOUNTER — Ambulatory Visit: Payer: Self-pay | Admitting: Pharmacist

## 2019-09-04 NOTE — Chronic Care Management (AMB) (Signed)
  Chronic Care Management   Note  09/04/2019 Name: Ethridge Sollenberger MRN: [id number] DOB: 06/02/1972  Darius Knox Cervi is a 49 y.o. year old male who is a primary care patient of Dorcas Carrow, DO. The CCM team was consulted for assistance with chronic disease management and care coordination needs.   Reviewing open pharmacy cases  Goals Addressed            This Visit's Progress     Patient Stated   . COMPLETED: "I can't afford a medication" (pt-stated)       CARE PLAN ENTRY (see longtitudinal plan of care for additional care plan information)  Current Barriers:  . Polypharmacy; complex patient with multiple comorbidities including insomnia, depression, anxiety o Insomnia: previously managed on Belsomra, but was not covered by insurance. Switched to Lunesta 3 mg  o Depression/anxiety: venlafaxine 75 mg daily, clonazepam 0.5 mg daily   Pharmacist Clinical Goal(s):  Marland Kitchen Over the next 30 days, patient will work with PharmD and provider towards optimized medication management  Interventions: . Lunesta attainable. Patient has f/u with PCP next month to assess tolerability.  Patient Self Care Activities:  . Patient will take medications as prescribed  Please see past updates related to this goal by clicking on the "Past Updates" button in the selected goal         Closing CCM pharmacy case  Catie Feliz Beam, PharmD, [facility] Clinical Pharmacist [facility] [phone]

## 2019-09-17 ENCOUNTER — Other Ambulatory Visit: Payer: Self-pay | Admitting: Family Medicine

## 2019-09-17 NOTE — Telephone Encounter (Signed)
Requested medication (s) are due for refill today: yes  Requested medication (s) are on the active medication list: yes  Last refill: 09/17/19  Future visit scheduled:yes  Notes to clinic:  not delegated    Requested Prescriptions  Pending Prescriptions Disp Refills   Eszopiclone 3 MG TABS [Pharmacy Med Name: ESZOPICLONE 3 MG TAB] 5 tablet     Sig: TAKE 1 TABLET BY MOUTH AT BEDTIME AS NEEDED      Not Delegated - Psychiatry:  Anxiolytics/Hypnotics Failed - 09/17/2019  2:37 PM      Failed - This refill cannot be delegated      Failed - Urine Drug Screen completed in last 360 days.      Passed - Valid encounter within last 6 months    Recent Outpatient Visits           2 months ago Insomnia, unspecified type   [facility], [name], DO   3 months ago Depression, major, single episode, moderate (HCC)   [facility] [place], [name], DO   4 months ago Insomnia, unspecified type   [facility] [place], [place], DO   4 months ago Neck pain   [facility] [place], [place], DO   5 months ago Mixed hyperlipidemia   [employer], [place], DO       Future Appointments             In 2 weeks Laural Benes, [name], DO [employer], [facility]

## 2019-09-17 NOTE — Telephone Encounter (Signed)
30 tabs with 2 refills given 07/05/19. Should not be due

## 2019-09-22 ENCOUNTER — Telehealth: Payer: Self-pay | Admitting: Family Medicine

## 2019-09-22 NOTE — Telephone Encounter (Signed)
Requested medication (s) are due for refill today: yes  Requested medication (s) are on the active medication list: yes  Last refill:  08/19/19 #5 pills  Future visit scheduled: yes  Notes to clinic:  last refill was for 5 tabs- Medication not delegated to NT to refill   Requested Prescriptions  Pending Prescriptions Disp Refills   Eszopiclone 3 MG TABS [Pharmacy Med Name: ESZOPICLONE 3 MG TAB] 5 tablet     Sig: TAKE 1 TABLET BY MOUTH AT BEDTIME AS NEEDED      Not Delegated - Psychiatry:  Anxiolytics/Hypnotics Failed - 09/22/2019  1:30 PM      Failed - This refill cannot be delegated      Failed - Urine Drug Screen completed in last 360 days.      Passed - Valid encounter within last 6 months    Recent Outpatient Visits           2 months ago Insomnia, unspecified type   [facility], [name], DO   3 months ago Depression, major, single episode, moderate (HCC)   [facility] [place], [name], DO   4 months ago Insomnia, unspecified type   [facility] [place], [place], DO   5 months ago Neck pain   [facility] [place], [place], DO   5 months ago Mixed hyperlipidemia   [facility] [place], [place], DO       Future Appointments             In 2 weeks Laural Benes, [name], DO [employer], [facility]

## 2019-09-23 NOTE — Telephone Encounter (Signed)
Pt called regarding refill for Eszopiclone 3 MG TABS  Would like to know why it was denied. Please advise pt. Stated he is out of medication [phone]

## 2019-09-23 NOTE — Telephone Encounter (Signed)
Patient would like to know reason RX was denied. Not clear as to why in the chart. Given 5 tabs on 08/19/19, has f/up 10/03/19

## 2019-09-23 NOTE — Telephone Encounter (Signed)
He was given 30 pills with 2 refills on 1/8. Based on that Rx alone he should not be out until 4/11. With the 5 extra pills that Fleet Contras gave him, he should not be out until 4/16. He is not due for a refill for another 2+ weeks.

## 2019-09-23 NOTE — Telephone Encounter (Signed)
Patient notified

## 2019-10-07 ENCOUNTER — Encounter: Payer: Self-pay | Admitting: Family Medicine

## 2019-11-14 DIAGNOSIS — R369 Urethral discharge, unspecified: Secondary | ICD-10-CM | POA: Diagnosis not present

## 2020-01-03 ENCOUNTER — Other Ambulatory Visit: Payer: Self-pay | Admitting: Family Medicine

## 2020-02-07 ENCOUNTER — Encounter: Payer: Self-pay | Admitting: Family Medicine

## 2020-02-07 ENCOUNTER — Ambulatory Visit (INDEPENDENT_AMBULATORY_CARE_PROVIDER_SITE_OTHER): Payer: BC Managed Care – PPO | Admitting: Family Medicine

## 2020-02-07 ENCOUNTER — Other Ambulatory Visit: Payer: Self-pay | Admitting: Family Medicine

## 2020-02-07 ENCOUNTER — Other Ambulatory Visit: Payer: Self-pay

## 2020-02-07 DIAGNOSIS — E782 Mixed hyperlipidemia: Secondary | ICD-10-CM

## 2020-02-07 DIAGNOSIS — G4700 Insomnia, unspecified: Secondary | ICD-10-CM | POA: Diagnosis not present

## 2020-02-07 DIAGNOSIS — F321 Major depressive disorder, single episode, moderate: Secondary | ICD-10-CM

## 2020-02-07 DIAGNOSIS — I10 Essential (primary) hypertension: Secondary | ICD-10-CM | POA: Diagnosis not present

## 2020-02-07 MED ORDER — ATORVASTATIN CALCIUM 40 MG PO TABS: 40.0000 mg | ORAL_TABLET | Freq: Every day | ORAL | 2 refills | Status: DC

## 2020-02-07 MED ORDER — ESZOPICLONE 3 MG PO TABS: 3.0000 mg | ORAL_TABLET | Freq: Every evening | ORAL | 2 refills | Status: DC | PRN

## 2020-02-07 MED ORDER — AMLODIPINE BESYLATE 10 MG PO TABS: 10.0000 mg | ORAL_TABLET | Freq: Every day | ORAL | 2 refills | Status: DC

## 2020-02-07 MED ORDER — CLONAZEPAM 0.5 MG PO TABS: ORAL_TABLET | ORAL | 0 refills | Status: DC

## 2020-02-07 NOTE — Progress Notes (Signed)
BP (!) 161/102 (BP Location: Left Arm, Patient Position: Sitting, Cuff Size: Normal)   Pulse 77   Temp 98.6 F (37 C) (Oral)   Wt 155 lb 9.6 oz (70.6 kg)   SpO2 95%   BMI 24.37 kg/m    Subjective:    Patient ID: Shawn Ware, male    DOB: [birth year], 49 y.o.   MRN: [id number]  HPI: Shawn Ware is a 49 y.o. male  Chief Complaint  Patient presents with  . Anxiety    med refill  . Hyperlipidemia    med refill  . Hypertension    med refill   HYPERTENSION / HYPERLIPIDEMIA- has been out of the amlodipine at least a couple of months Satisfied with current treatment? no Duration of hypertension: chronic BP monitoring frequency: not checking BP medication side effects: no Past BP meds: amlodipine Duration of hyperlipidemia: chronic Cholesterol medication side effects: no Cholesterol supplements: none Past cholesterol medications: atorvasatin Medication compliance: fair compliance Aspirin: yes Recent stressors: yes Recurrent headaches: no Visual changes: no Palpitations: no Dyspnea: no Chest pain: no Lower extremity edema: no Dizzy/lightheaded: no  ANXIETY/DEPRESSION- Took himself off his effexor. Has not had his klonopin in months. Doing OK Duration: Chronic Status:stable Anxious mood: yes  Excessive worrying: no Irritability: no  Sweating: no Nausea: no Palpitations:no Hyperventilation: no Panic attacks: no Agoraphobia: no  Obscessions/compulsions: no Depressed mood: no Depression screen [facility] 2/9 02/07/2020 06/04/2019 05/08/2019 04/03/2019 05/10/2018  Decreased Interest 0 3 3 2  0  Down, Depressed, Hopeless 0 2 3 2  0  PHQ - 2 Score 0 5 6 4  0  Altered sleeping 1 3 3 2  0  Tired, decreased energy 0 2 2 2  0  Change in appetite 0 2 2 1  0  Feeling bad or failure about yourself  0 3 2 1  0  Trouble concentrating 0 1 3 1  0  Moving slowly or fidgety/restless 0 1 0 1 0  Suicidal thoughts 0 0 0 0 0  PHQ-9 Score 1 17 18 12  0  Difficult doing work/chores  - Somewhat difficult Somewhat difficult Somewhat difficult Not difficult at all  Some recent data might be hidden   Anhedonia: no Weight changes: no Insomnia: yes hard to stay asleep  Hypersomnia: no Fatigue/loss of energy: yes Feelings of worthlessness: no Feelings of guilt: no Impaired concentration/indecisiveness: no Suicidal ideations: no  Crying spells: no Recent Stressors/Life Changes: no   Relationship problems: no   Family stress: no     Financial stress: no    Job stress: no    Recent death/loss: yes  INSOMNIA Duration: chronic Satisfied with sleep quality: yes Difficulty falling asleep: yes Difficulty staying asleep: yes Waking a few hours after sleep onset: yes Early morning awakenings: no Daytime hypersomnolence: no Wakes feeling refreshed: yes Good sleep hygiene: yes Apnea: no Snoring: no Depressed/anxious mood: yes Recent stress: yes Restless legs/nocturnal leg cramps: no Chronic pain/arthritis: no History of sleep study: no Treatments attempted: lunesta, melatonin, uinsom, benadryl and ambien   Relevant past medical, surgical, family and social history reviewed and updated as indicated. Interim medical history since our last visit reviewed. Allergies and medications reviewed and updated.  Review of Systems  Constitutional: Negative.   Respiratory: Negative.   Cardiovascular: Negative.   Gastrointestinal: Negative.   Musculoskeletal: Negative.   Skin: Negative.   Neurological: Negative.   Psychiatric/Behavioral: Positive for sleep disturbance. Negative for agitation, behavioral problems, confusion, decreased concentration, dysphoric mood, hallucinations, self-injury and suicidal ideas. The patient is nervous/anxious.  The patient is not hyperactive.     Per HPI unless specifically indicated above     Objective:    BP (!) 161/102 (BP Location: Left Arm, Patient Position: Sitting, Cuff Size: Normal)   Pulse 77   Temp 98.6 F (37 C) (Oral)   Wt  155 lb 9.6 oz (70.6 kg)   SpO2 95%   BMI 24.37 kg/m   Wt Readings from Last 3 Encounters:  02/07/20 155 lb 9.6 oz (70.6 kg)  07/05/19 145 lb (65.8 kg)  06/04/19 146 lb (66.2 kg)    Physical Exam Vitals and nursing note reviewed.  Constitutional:      General: He is not in acute distress.    Appearance: Normal appearance. He is not ill-appearing, toxic-appearing or diaphoretic.  HENT:     Head: Normocephalic and atraumatic.     Right Ear: External ear normal.     Left Ear: External ear normal.     Nose: Nose normal.     Mouth/Throat:     Mouth: Mucous membranes are moist.     Pharynx: Oropharynx is clear.  Eyes:     General: No scleral icterus.       Right eye: No discharge.        Left eye: No discharge.     Extraocular Movements: Extraocular movements intact.     Conjunctiva/sclera: Conjunctivae normal.     Pupils: Pupils are equal, round, and reactive to light.  Cardiovascular:     Rate and Rhythm: Normal rate and regular rhythm.     Pulses: Normal pulses.     Heart sounds: Normal heart sounds. No murmur heard.  No friction rub. No gallop.   Pulmonary:     Effort: Pulmonary effort is normal. No respiratory distress.     Breath sounds: Normal breath sounds. No stridor. No wheezing, rhonchi or rales.  Chest:     Chest wall: No tenderness.  Musculoskeletal:        General: Normal range of motion.     Cervical back: Normal range of motion and neck supple.  Skin:    General: Skin is warm and dry.     Capillary Refill: Capillary refill takes less than 2 seconds.     Coloration: Skin is not jaundiced or pale.     Findings: No bruising, erythema, lesion or rash.  Neurological:     General: No focal deficit present.     Mental Status: He is alert and oriented to person, place, and time. Mental status is at baseline.  Psychiatric:        Mood and Affect: Mood normal.        Behavior: Behavior normal.        Thought Content: Thought content normal.        Judgment:  Judgment normal.     Results for orders placed or performed in visit on 06/05/18  Measles/Mumps/Rubella Immunity  Result Value Ref Range   Rubella Antibodies, IGG 2.11 Immune >0.99 index   RUBEOLA AB, IGG >300.0 Immune >16.4 AU/mL   MUMPS ABS, IGG 15.0 Immune >10.9 AU/mL      Assessment & Plan:   Problem List Items Addressed This Visit      Cardiovascular and Mediastinum   Hypertension    Has not been taking his medicine. Restart and recheck 1 month with labs.      Relevant Medications   amLODipine (NORVASC) 10 MG tablet     Other   Hyperlipidemia    Has not  been taking his medicine. Restart and recheck 1 month with labs.      Relevant Medications   amLODipine (NORVASC) 10 MG tablet   Depression, major, single episode, moderate (HCC)    Doing better. Off medicine. Will continue klonopin PRN. 30 pills must last at least 2-3 months. Call with any concerns.       Insomnia    Under good control on current regimen. Continue current regimen. Continue to monitor. Call with any concerns. Refills given for 3 months. Follow up 3 months.             Follow up plan: Return in about 4 weeks (around 03/06/2020) for Physical.

## 2020-02-08 ENCOUNTER — Encounter: Payer: Self-pay | Admitting: Family Medicine

## 2020-02-08 NOTE — Assessment & Plan Note (Signed)
Has not been taking his medicine. Restart and recheck 1 month with labs.

## 2020-02-08 NOTE — Assessment & Plan Note (Signed)
Doing better. Off medicine. Will continue klonopin PRN. 30 pills must last at least 2-3 months. Call with any concerns.

## 2020-02-08 NOTE — Assessment & Plan Note (Signed)
Under good control on current regimen. Continue current regimen. Continue to monitor. Call with any concerns. Refills given for 3 months. Follow up 3 months.    

## 2020-02-08 NOTE — Assessment & Plan Note (Signed)
Has not been taking his medicine. Restart and recheck 1 month with labs. 

## 2020-04-01 ENCOUNTER — Ambulatory Visit (INDEPENDENT_AMBULATORY_CARE_PROVIDER_SITE_OTHER): Payer: BC Managed Care – PPO | Admitting: Nurse Practitioner

## 2020-04-01 ENCOUNTER — Encounter: Payer: Self-pay | Admitting: Nurse Practitioner

## 2020-04-01 ENCOUNTER — Other Ambulatory Visit: Payer: Self-pay

## 2020-04-01 VITALS — BP 123/81 | HR 80 | Temp 99.3°F | Ht 67.0 in | Wt 152.0 lb

## 2020-04-01 DIAGNOSIS — J301 Allergic rhinitis due to pollen: Secondary | ICD-10-CM | POA: Diagnosis not present

## 2020-04-01 DIAGNOSIS — H6983 Other specified disorders of Eustachian tube, bilateral: Secondary | ICD-10-CM | POA: Diagnosis not present

## 2020-04-01 DIAGNOSIS — H6993 Unspecified Eustachian tube disorder, bilateral: Secondary | ICD-10-CM | POA: Insufficient documentation

## 2020-04-01 DIAGNOSIS — J309 Allergic rhinitis, unspecified: Secondary | ICD-10-CM | POA: Insufficient documentation

## 2020-04-01 MED ORDER — PREDNISONE 20 MG PO TABS
40.0000 mg | ORAL_TABLET | Freq: Every day | ORAL | 0 refills | Status: DC
Start: 2020-04-01 — End: 2020-04-06

## 2020-04-01 MED ORDER — FLUTICASONE PROPIONATE 50 MCG/ACT NA SUSP
2.0000 | Freq: Every day | NASAL | 6 refills | Status: DC
Start: 2020-04-01 — End: 2020-05-25

## 2020-04-01 NOTE — Progress Notes (Signed)
BP 123/81 (BP Location: Left Arm, Patient Position: Sitting, Cuff Size: Normal)   Pulse 80   Temp 99.3 F (37.4 C) (Oral)   Ht 5\' 7"  (1.702 m)   Wt 152 lb (68.9 kg)   SpO2 99%   BMI 23.81 kg/m    Subjective:    Patient ID: , male    DOB: 06/02/1972, 49 y.o.   MRN: [id number]  HPI: Shawn Ware is a 49 y.o. male  Chief Complaint  Patient presents with  . Ear Problem    C/O congestion x several months.   EAR CONGESTION Has been present for several months.  Present to both ears and nose, feels it may be allergies.  Tried 49 and tried OTC Benadryl and another OTC allergy medication, but not using consistently.  Using Flonase intermittently.  Has history of allergies, pollen related -- during certain times of year.  Has never been to allergist.  Does endorse some popping in ears. Duration: months Involved ear(s): bilateral Otorrhea: no Upper respiratory infection symptoms: no Pruritus: no Hearing loss: no Water immersion no Using Q-tips: no Recurrent otitis media: no Status: fluctuating Treatments attempted: as above  Relevant past medical, surgical, family and social history reviewed and updated as indicated. Interim medical history since our last visit reviewed. Allergies and medications reviewed and updated.  Review of Systems  Constitutional: Negative for activity change, diaphoresis, fatigue and fever.  HENT: Positive for congestion, postnasal drip, rhinorrhea and sinus pressure. Negative for ear discharge, ear pain and sinus pain.   Respiratory: Negative for cough, chest tightness, shortness of breath and wheezing.   Cardiovascular: Negative for chest pain, palpitations and leg swelling.  Gastrointestinal: Negative.   Musculoskeletal: Negative.   Skin: Negative.   Neurological: Negative.   Psychiatric/Behavioral: Negative.     Per HPI unless specifically indicated above     Objective:    BP 123/81 (BP Location: Left Arm,  Patient Position: Sitting, Cuff Size: Normal)   Pulse 80   Temp 99.3 F (37.4 C) (Oral)   Ht 5\' 7"  (1.702 m)   Wt 152 lb (68.9 kg)   SpO2 99%   BMI 23.81 kg/m   Wt Readings from Last 3 Encounters:  04/01/20 152 lb (68.9 kg)  02/07/20 155 lb 9.6 oz (70.6 kg)  07/05/19 145 lb (65.8 kg)    Physical Exam Vitals and nursing note reviewed.  Constitutional:      General: He is awake. He is not in acute distress.    Appearance: He is well-developed and well-groomed. He is not ill-appearing.  HENT:     Head: Normocephalic and atraumatic.     Right Ear: Hearing, ear canal and external ear normal. No drainage. A middle ear effusion is present.     Left Ear: Hearing, ear canal and external ear normal. No drainage. A middle ear effusion is present.     Ears:     Comments: Minimal cerumen noted on exam.    Nose:     Right Turbinates: Enlarged and pale.     Left Turbinates: Enlarged and pale.     Right Sinus: No maxillary sinus tenderness or frontal sinus tenderness.     Left Sinus: No maxillary sinus tenderness or frontal sinus tenderness.     Mouth/Throat:     Mouth: Mucous membranes are moist.     Pharynx: Uvula midline. Posterior oropharyngeal erythema (cobblestoning present) present.  Eyes:     General: Lids are normal.  Right eye: No discharge.        Left eye: No discharge.     Conjunctiva/sclera: Conjunctivae normal.     Pupils: Pupils are equal, round, and reactive to light.  Neck:     Vascular: No carotid bruit.     Trachea: Trachea normal.  Cardiovascular:     Rate and Rhythm: Normal rate and regular rhythm.     Heart sounds: Normal heart sounds, S1 normal and S2 normal. No murmur heard.  No gallop.   Pulmonary:     Effort: Pulmonary effort is normal. No accessory muscle usage or respiratory distress.     Breath sounds: Normal breath sounds.  Abdominal:     General: Bowel sounds are normal.     Palpations: Abdomen is soft. There is no hepatomegaly or splenomegaly.   Musculoskeletal:        General: Normal range of motion.     Cervical back: Normal range of motion and neck supple.     Right lower leg: No edema.     Left lower leg: No edema.  Skin:    General: Skin is warm and dry.     Capillary Refill: Capillary refill takes less than 2 seconds.  Neurological:     Mental Status: He is alert and oriented to person, place, and time.  Psychiatric:        Attention and Perception: Attention normal.        Mood and Affect: Mood normal.        Speech: Speech normal.        Behavior: Behavior normal. Behavior is cooperative.        Thought Content: Thought content normal.     Results for orders placed or performed in visit on 06/05/18  Measles/Mumps/Rubella Immunity  Result Value Ref Range   Rubella Antibodies, IGG 2.11 Immune >0.99 index   RUBEOLA AB, IGG >300.0 Immune >16.4 AU/mL   MUMPS ABS, IGG 15.0 Immune >10.9 AU/mL      Assessment & Plan:   Problem List Items Addressed This Visit      Respiratory   Allergic rhinitis    Chronic, ongoing.  Script for Flonase sent and recommend to use consistently on daily basis + recommend taking OTC Claritin or Allegra on daily basis.  Avoid irritants.  If worsening or ongoing symptoms plan to refer to allergist for testing.        Nervous and Auditory   Eustachian tube dysfunction, bilateral - Primary    Acute for one month with no improvement, suspect from allergic rhinitis.  Script for Prednisone sent in 40 MG x 5 days, educated patient on this.  Recommend to start taking allergy medications as reviewed on allergic rhinitis plan.  Return for worsening or ongoing symptoms.          Follow up plan: Return if symptoms worsen or fail to improve.

## 2020-04-01 NOTE — Assessment & Plan Note (Signed)
Chronic, ongoing.  Script for Flonase sent and recommend to use consistently on daily basis + recommend taking OTC Claritin or Allegra on daily basis.  Avoid irritants.  If worsening or ongoing symptoms plan to refer to allergist for testing.

## 2020-04-01 NOTE — Patient Instructions (Signed)
Allergic Rhinitis, Adult Allergic rhinitis is a reaction to allergens in the air. Allergens are tiny specks (particles) in the air that cause your body to have an allergic reaction. This condition cannot be passed from person to person (is not contagious). Allergic rhinitis cannot be cured, but it can be controlled. There are two types of allergic rhinitis:  Seasonal. This type is also called hay fever. It happens only during certain times of the year.  Perennial. This type can happen at any time of the year. What are the causes? This condition may be caused by:  Pollen from grasses, trees, and weeds.  House dust mites.  Pet dander.  Mold. What are the signs or symptoms? Symptoms of this condition include:  Sneezing.  Runny or stuffy nose (nasal congestion).  A lot of mucus in the back of the throat (postnasal drip).  Itchy nose.  Tearing of the eyes.  Trouble sleeping.  Being sleepy during day. How is this treated? There is no cure for this condition. You should avoid things that trigger your symptoms (allergens). Treatment can help to relieve symptoms. This may include:  Medicines that block allergy symptoms, such as antihistamines. These may be given as a shot, nasal spray, or pill.  Shots that are given until your body becomes less sensitive to the allergen (desensitization).  Stronger medicines, if all other treatments have not worked. Follow these instructions at home: Avoiding allergens   Find out what you are allergic to. Common allergens include smoke, dust, and pollen.  Avoid them if you can. These are some of the things that you can do to avoid allergens: ? Replace carpet with wood, tile, or vinyl flooring. Carpet can trap dander and dust. ? Clean any mold found in the home. ? Do not smoke. Do not allow smoking in your home. ? Change your heating and air conditioning filter at least once a month. ? During allergy season:  Keep windows closed as much as  you can. If possible, use air conditioning when there is a lot of pollen in the air.  Use a special filter for allergies with your furnace and air conditioner.  Plan outdoor activities when pollen counts are lowest. This is usually during the early morning or evening hours.  If you do go outdoors when pollen count is high, wear a special mask for people with allergies.  When you come indoors, take a shower and change your clothes before sitting on furniture or bedding. General instructions  Do not use fans in your home.  Do not hang clothes outside to dry.  Wear sunglasses to keep pollen out of your eyes.  Wash your hands right away after you touch household pets.  Take over-the-counter and prescription medicines only as told by your doctor.  Keep all follow-up visits as told by your doctor. This is important. Contact a doctor if:  You have a fever.  You have a cough that does not go away (is persistent).  You start to make whistling sounds when you breathe (wheeze).  Your symptoms do not get better with treatment.  You have thick fluid coming from your nose.  You start to have nosebleeds. Get help right away if:  Your tongue or your lips are swollen.  You have trouble breathing.  You feel dizzy or you feel like you are going to pass out (faint).  You have cold sweats. Summary  Allergic rhinitis is a reaction to allergens in the air.  This condition may be   caused by allergens. These include pollen, dust mites, pet dander, and mold.  Symptoms include a runny, itchy nose, sneezing, or tearing eyes. You may also have trouble sleeping or feel sleepy during the day.  Treatment includes taking medicines and avoiding allergens. You may also get shots or take stronger medicines.  Get help if you have a fever or a cough that does not stop. Get help right away if you are short of breath. This information is not intended to replace advice given to you by your health care  provider. Make sure you discuss any questions you have with your health care provider. Document Revised: 10/02/2018 Document Reviewed: 01/02/2018 Elsevier Patient Education  2020 [employer]  

## 2020-04-01 NOTE — Assessment & Plan Note (Signed)
Acute for one month with no improvement, suspect from allergic rhinitis.  Script for Prednisone sent in 40 MG x 5 days, educated patient on this.  Recommend to start taking allergy medications as reviewed on allergic rhinitis plan.  Return for worsening or ongoing symptoms.

## 2020-04-06 ENCOUNTER — Telehealth: Payer: Self-pay | Admitting: Family Medicine

## 2020-04-06 ENCOUNTER — Other Ambulatory Visit: Payer: Self-pay | Admitting: Nurse Practitioner

## 2020-04-06 MED ORDER — PREDNISONE 20 MG PO TABS: 40.0000 mg | ORAL_TABLET | Freq: Every day | ORAL | 0 refills | Status: AC

## 2020-04-06 NOTE — Telephone Encounter (Signed)
VM, Unable to lvm to make this physical apt.

## 2020-04-06 NOTE — Telephone Encounter (Signed)
Patient last seen Dr. Shela Commons on 02/07/20 and Corrie Dandy on 04/01/20. Routing to Dr. Shela Commons to advise on Clonazepam and Jolene to advise on Prednisone.

## 2020-04-06 NOTE — Telephone Encounter (Signed)
I sent in Prednisone, but recommend if ongoing issues after use to return to office.

## 2020-04-06 NOTE — Telephone Encounter (Signed)
Over due for physical

## 2020-04-06 NOTE — Telephone Encounter (Addendum)
1  Pt saw jolene 10/06 and was prescribed  predniSONE (DELTASONE) 20 MG tablet Pt states he took a trip out of town and lost the medication. Pt only taken 3 of this med  so not feeling much better. Pt requesting another refill be sent to pharmacy.  2.  Also requesting  clonazePAM (KLONOPIN) 0.5 MG tablet   [facility] - [place], [place] - [address] Phone:  [phone]  Fax:  [phone]

## 2020-04-07 NOTE — Telephone Encounter (Signed)
lvm to make this physical apt.  

## 2020-04-09 NOTE — Telephone Encounter (Signed)
lvm to make this physical apt.

## 2020-04-21 ENCOUNTER — Encounter: Payer: BC Managed Care – PPO | Admitting: Nurse Practitioner

## 2020-04-21 NOTE — Progress Notes (Deleted)
There were no vitals taken for this visit.   Subjective:    Patient ID: Shawn Ware, male    DOB: 06/02/1972, 49 y.o.   MRN: [id number]  HPI: Shawn Ware is a 49 y.o. male presenting on 04/21/2020 for comprehensive medical examination. Current medical complaints include:{Blank single:19197::"none","***"}  He currently lives with: Interim Problems from his last visit: {Blank single:19197::"yes","no"}  Depression Screen done today and results listed below:  Depression screen [facility] 2/9 02/07/2020 06/04/2019 05/08/2019 04/03/2019 05/10/2018  Decreased Interest 0 3 3 2  0  Down, Depressed, Hopeless 0 2 3 2  0  PHQ - 2 Score 0 5 6 4  0  Altered sleeping 1 3 3 2  0  Tired, decreased energy 0 2 2 2  0  Change in appetite 0 2 2 1  0  Feeling bad or failure about yourself  0 3 2 1  0  Trouble concentrating 0 1 3 1  0  Moving slowly or fidgety/restless 0 1 0 1 0  Suicidal thoughts 0 0 0 0 0  PHQ-9 Score 1 17 18 12  0  Difficult doing work/chores - Somewhat difficult Somewhat difficult Somewhat difficult Not difficult at all  Some recent data might be hidden    The patient {has/does not have:19849} a history of falls. I {did/did not:19850} complete a risk assessment for falls. A plan of care for falls {was/was not:19852} documented.   Past Medical History:  Past Medical History:  Diagnosis Date  . Chest pain   . Hyperlipidemia     Surgical History:  Past Surgical History:  Procedure Laterality Date  . HERNIA REPAIR      Medications:  Current Outpatient Medications on File Prior to Visit  Medication Sig  . amLODipine (NORVASC) 10 MG tablet Take 1 tablet (10 mg total) by mouth daily.  atorvastatin (LIPITOR) 40 MG tablet TAKE 1 TABLET BY MOUTH ONCE DAILY  . clonazePAM (KLONOPIN) 0.5 MG tablet TAKE 1 TABLET BY MOUTH ONCE DAILY AS NEEDED ANXIETY  . Eszopiclone 3 MG TABS Take 1 tablet (3 mg total) by mouth at bedtime as needed. Take immediately before bedtime  . fluticasone  (FLONASE) 50 MCG/ACT nasal spray Place 2 sprays into both nostrils daily.   No current facility-administered medications on file prior to visit.    Allergies:  Allergies  Allergen Reactions  . Penicillins   . Ambien [Zolpidem] Palpitations  . Lisinopril Rash    Social History:  Social History   Socioeconomic History  . Marital status: Married    Spouse name: Not on file  . Number of children: 0  . Years of education: Not on file  . Highest education level: Not on file  Occupational History  . Occupation: IT  Tobacco Use  . Smoking status: Former Smoker    Packs/day: 0.50    Years: 17.00    Pack years: 8.50    Types: Cigarettes    Quit date: 03/26/2017    Years since quitting: 3.0  . Smokeless tobacco: Never Used  Vaping Use  . Vaping Use: Never used  Substance and Sexual Activity  . Alcohol use: Yes    Comment: socially  . Drug use: No  . Sexual activity: Yes  Other Topics Concern  . Not on file  Social History Narrative   Pt is an . He is single. He is a smoker. He denies alcohol consumption   Social Determinants of Health   Financial Resource Strain:   . Difficulty of Paying Living Expenses:  Not on file  Food Insecurity:   . Worried About Programme researcher, broadcasting/film/video in the Last Year: Not on file  . Ran Out of Food in the Last Year: Not on file  Transportation Needs:   . Lack of Transportation (Medical): Not on file  . Lack of Transportation (Non-Medical): Not on file  Physical Activity:   . Days of Exercise per Week: Not on file  . Minutes of Exercise per Session: Not on file  Stress:   . Feeling of Stress : Not on file  Social Connections:   . Frequency of Communication with Friends and Family: Not on file  . Frequency of Social Gatherings with Friends and Family: Not on file  . Attends Religious Services: Not on file  . Active Member of Clubs or Organizations: Not on file  . Attends Banker Meetings: Not on file  . Marital Status:  Not on file  Intimate Partner Violence:   . Fear of Current or Ex-Partner: Not on file  . Emotionally Abused: Not on file  . Physically Abused: Not on file  . Sexually Abused: Not on file   Social History   Tobacco Use  Smoking Status Former Smoker  . Packs/day: 0.50  . Years: 17.00  . Pack years: 8.50  . Types: Cigarettes  . Quit date: 03/26/2017  . Years since quitting: 3.0  Smokeless Tobacco Never Used   Social History   Substance and Sexual Activity  Alcohol Use Yes   Comment: socially    Family History:  Family History  Problem Relation Age of Onset  . Heart attack Mother   . Hypertension Mother   . Hyperlipidemia Mother   . Diabetes Maternal Grandmother   . Hyperlipidemia Father     Past medical history, surgical history, medications, allergies, family history and social history reviewed with patient today and changes made to appropriate areas of the chart.   Review of Systems - {ros master:310782} All other ROS negative except what is listed above and in the HPI.      Objective:    There were no vitals taken for this visit.  Wt Readings from Last 3 Encounters:  04/01/20 152 lb (68.9 kg)  02/07/20 155 lb 9.6 oz (70.6 kg)  07/05/19 145 lb (65.8 kg)    Physical Exam     Assessment & Plan:   Problem List Items Addressed This Visit    None       Discussed aspirin prophylaxis for myocardial infarction prevention and decision was {Blank single:19197::"it was not indicated","made to continue ASA","made to start ASA","made to stop ASA","that we recommended ASA, and patient refused"}  LABORATORY TESTING:  Health maintenance labs ordered today as discussed above.   The natural history of prostate cancer and ongoing controversy regarding screening and potential treatment outcomes of prostate cancer has been discussed with the patient. The meaning of a false positive PSA and a false negative PSA has been discussed. He indicates understanding of the  limitations of this screening test and wishes *** to proceed with screening PSA testing.   IMMUNIZATIONS:   - Tdap: Tetanus vaccination status reviewed: {tetanus status:315746}. - Influenza: {Blank single:19197::"Up to date","Administered today","Postponed to flu season","Refused","Given elsewhere"} - Pneumovax: {Blank single:19197::"Up to date","Administered today","Not applicable","Refused","Given elsewhere"} - Prevnar: {Blank single:19197::"Up to date","Administered today","Not applicable","Refused","Given elsewhere"} - HPV: {Blank single:19197::"Up to date","Administered today","Not applicable","Refused","Given elsewhere"} - Zostavax vaccine: {Blank single:19197::"Up to date","Administered today","Not applicable","Refused","Given elsewhere"}  SCREENING: - Colonoscopy: {Blank single:19197::"Up to date","Ordered today","Not applicable","Refused","Done elsewhere"}  Discussed  with patient purpose of the colonoscopy is to detect colon cancer at curable precancerous or early stages   - AAA Screening: {Blank single:19197::"Up to date","Ordered today","Not applicable","Refused","Done elsewhere"}  -Hearing Test: {Blank single:19197::"Up to date","Ordered today","Not applicable","Refused","Done elsewhere"}  -Spirometry: {Blank single:19197::"Up to date","Ordered today","Not applicable","Refused","Done elsewhere"}   PATIENT COUNSELING:    Sexuality: Discussed sexually transmitted diseases, partner selection, use of condoms, avoidance of unintended pregnancy  and contraceptive alternatives.   Advised to avoid cigarette smoking.  I discussed with the patient that most people either abstain from alcohol or drink within safe limits (<=14/week and <=4 drinks/occasion for males, <=7/weeks and <= 3 drinks/occasion for females) and that the risk for alcohol disorders and other health effects rises proportionally with the number of drinks per week and how often a drinker exceeds daily limits.  Discussed  cessation/primary prevention of drug use and availability of treatment for abuse.   Diet: Encouraged to adjust caloric intake to maintain  or achieve ideal body weight, to reduce intake of dietary saturated fat and total fat, to limit sodium intake by avoiding high sodium foods and not adding table salt, and to maintain adequate dietary potassium and calcium preferably from fresh fruits, vegetables, and low-fat dairy products.    stressed the importance of regular exercise  Injury prevention: Discussed safety belts, safety helmets, smoke detector, smoking near bedding or upholstery.   Dental health: Discussed importance of regular tooth brushing, flossing, and dental visits.   Follow up plan: NEXT PREVENTATIVE PHYSICAL DUE IN 1 YEAR. No follow-ups on file.

## 2020-04-22 ENCOUNTER — Other Ambulatory Visit: Payer: Self-pay | Admitting: Family Medicine

## 2020-04-29 ENCOUNTER — Other Ambulatory Visit: Payer: Self-pay | Admitting: Family Medicine

## 2020-04-29 NOTE — Telephone Encounter (Signed)
Requested medication (s) are due for refill today:yes  Requested medication (s) are on the active medication list: yes  Last refill:  03/27/20  Future visit scheduled: no  Notes to clinic:  not delegated    Requested Prescriptions  Pending Prescriptions Disp Refills   Eszopiclone 3 MG TABS [Pharmacy Med Name: ESZOPICLONE 3 MG TAB] 30 tablet     Sig: TAKE 1 TABLET BY MOUTH AT BEDTIME AS NEEDED. (TAKE IMMEDIATELY BEFORE BEDTIME)      Not Delegated - Psychiatry:  Anxiolytics/Hypnotics Failed - 04/29/2020  8:06 AM      Failed - This refill cannot be delegated      Failed - Urine Drug Screen completed in last 360 days      Passed - Valid encounter within last 6 months    Recent Outpatient Visits           4 weeks ago Eustachian tube dysfunction, bilateral   [facility] [place], [place] T, NP   2 months ago Essential hypertension   [facility] [place], [name], DO   9 months ago Insomnia, unspecified type   [facility], [name], DO   11 months ago Depression, major, single episode, moderate (HCC)   [facility] [place], [name], DO   11 months ago Insomnia, unspecified type   [facility], [place], DO

## 2020-05-22 DIAGNOSIS — Z87442 Personal history of urinary calculi: Secondary | ICD-10-CM | POA: Diagnosis not present

## 2020-05-22 DIAGNOSIS — Z8659 Personal history of other mental and behavioral disorders: Secondary | ICD-10-CM | POA: Diagnosis not present

## 2020-05-22 DIAGNOSIS — R350 Frequency of micturition: Secondary | ICD-10-CM | POA: Diagnosis not present

## 2020-05-22 DIAGNOSIS — M549 Dorsalgia, unspecified: Secondary | ICD-10-CM | POA: Diagnosis not present

## 2020-05-25 ENCOUNTER — Other Ambulatory Visit: Payer: Self-pay

## 2020-05-25 ENCOUNTER — Encounter: Payer: Self-pay | Admitting: Family Medicine

## 2020-05-25 ENCOUNTER — Ambulatory Visit (INDEPENDENT_AMBULATORY_CARE_PROVIDER_SITE_OTHER): Payer: BC Managed Care – PPO | Admitting: Family Medicine

## 2020-05-25 VITALS — BP 108/72 | HR 99 | Temp 98.6°F | Ht 66.02 in | Wt 150.8 lb

## 2020-05-25 DIAGNOSIS — M545 Low back pain, unspecified: Secondary | ICD-10-CM

## 2020-05-25 DIAGNOSIS — F321 Major depressive disorder, single episode, moderate: Secondary | ICD-10-CM

## 2020-05-25 DIAGNOSIS — I10 Essential (primary) hypertension: Secondary | ICD-10-CM

## 2020-05-25 DIAGNOSIS — R109 Unspecified abdominal pain: Secondary | ICD-10-CM | POA: Diagnosis not present

## 2020-05-25 DIAGNOSIS — G4700 Insomnia, unspecified: Secondary | ICD-10-CM

## 2020-05-25 DIAGNOSIS — E782 Mixed hyperlipidemia: Secondary | ICD-10-CM

## 2020-05-25 MED ORDER — ATORVASTATIN CALCIUM 40 MG PO TABS
40.0000 mg | ORAL_TABLET | Freq: Every day | ORAL | 1 refills | Status: DC
Start: 2020-05-25 — End: 2020-08-21

## 2020-05-25 MED ORDER — CLONAZEPAM 0.5 MG PO TABS
ORAL_TABLET | ORAL | 0 refills | Status: DC
Start: 2020-05-25 — End: 2020-08-21

## 2020-05-25 MED ORDER — FLUTICASONE PROPIONATE 50 MCG/ACT NA SUSP: 2.0000 | Freq: Every day | NASAL | 6 refills | Status: DC

## 2020-05-25 MED ORDER — ESZOPICLONE 3 MG PO TABS
3.0000 mg | ORAL_TABLET | Freq: Every evening | ORAL | 2 refills | Status: DC | PRN
Start: 2020-05-25 — End: 2020-08-21

## 2020-05-25 MED ORDER — AMLODIPINE BESYLATE 10 MG PO TABS
10.0000 mg | ORAL_TABLET | Freq: Every day | ORAL | 1 refills | Status: DC
Start: 2020-05-25 — End: 2020-08-21

## 2020-05-25 NOTE — Assessment & Plan Note (Signed)
Under good control on current regimen. Continue current regimen. Continue to monitor. Call with any concerns. Refills given. 30 pills should last at least 3 months. Call with any concerns.

## 2020-05-25 NOTE — Patient Instructions (Addendum)
Back Exercises The following exercises strengthen the muscles that help to support the trunk and back. They also help to keep the lower back flexible. Doing these exercises can help to prevent back pain or lessen existing pain.  If you have back pain or discomfort, try doing these exercises 2-3 times each day or as told by your health care provider.  As your pain improves, do them once each day, but increase the number of times that you repeat the steps for each exercise (do more repetitions).  To prevent the recurrence of back pain, continue to do these exercises once each day or as told by your health care provider. Do exercises exactly as told by your health care provider and adjust them as directed. It is normal to feel mild stretching, pulling, tightness, or discomfort as you do these exercises, but you should stop right away if you feel sudden pain or your pain gets worse. Exercises Single knee to chest Repeat these steps 3-5 times for each leg: 1. Lie on your back on a firm bed or the floor with your legs extended. 2. Bring one knee to your chest. Your other leg should stay extended and in contact with the floor. 3. Hold your knee in place by grabbing your knee or thigh with both hands and hold. 4. Pull on your knee until you feel a gentle stretch in your lower back or buttocks. 5. Hold the stretch for 10-30 seconds. 6. Slowly release and straighten your leg. Pelvic tilt Repeat these steps 5-10 times: 1. Lie on your back on a firm bed or the floor with your legs extended. 2. Bend your knees so they are pointing toward the ceiling and your feet are flat on the floor. 3. Tighten your lower abdominal muscles to press your lower back against the floor. This motion will tilt your pelvis so your tailbone points up toward the ceiling instead of pointing to your feet or the floor. 4. With gentle tension and even breathing, hold this position for 5-10 seconds. Cat-cow Repeat these steps until  your lower back becomes more flexible: 1. Get into a hands-and-knees position on a firm surface. Keep your hands under your shoulders, and keep your knees under your hips. You may place padding under your knees for comfort. 2. Let your head hang down toward your chest. Contract your abdominal muscles and point your tailbone toward the floor so your lower back becomes rounded like the back of a cat. 3. Hold this position for 5 seconds. 4. Slowly lift your head, let your abdominal muscles relax and point your tailbone up toward the ceiling so your back forms a sagging arch like the back of a cow. 5. Hold this position for 5 seconds.  Press-ups Repeat these steps 5-10 times: 1. Lie on your abdomen (face-down) on the floor. 2. Place your palms near your head, about shoulder-width apart. 3. Keeping your back as relaxed as possible and keeping your hips on the floor, slowly straighten your arms to raise the top half of your body and lift your shoulders. Do not use your back muscles to raise your upper torso. You may adjust the placement of your hands to make yourself more comfortable. 4. Hold this position for 5 seconds while you keep your back relaxed. 5. Slowly return to lying flat on the floor.  Bridges Repeat these steps 10 times: 1. Lie on your back on a firm surface. 2. Bend your knees so they are pointing toward the ceiling and   your feet are flat on the floor. Your arms should be flat at your sides, next to your body. 3. Tighten your buttocks muscles and lift your buttocks off the floor until your waist is at almost the same height as your knees. You should feel the muscles working in your buttocks and the back of your thighs. If you do not feel these muscles, slide your feet 1-2 inches farther away from your buttocks. 4. Hold this position for 3-5 seconds. 5. Slowly lower your hips to the starting position, and allow your buttocks muscles to relax completely. If this exercise is too easy, try  doing it with your arms crossed over your chest. Abdominal crunches Repeat these steps 5-10 times: 1. Lie on your back on a firm bed or the floor with your legs extended. 2. Bend your knees so they are pointing toward the ceiling and your feet are flat on the floor. 3. Cross your arms over your chest. 4. Tip your chin slightly toward your chest without bending your neck. 5. Tighten your abdominal muscles and slowly raise your trunk (torso) high enough to lift your shoulder blades a tiny bit off the floor. Avoid raising your torso higher than that because it can put too much stress on your low back and does not help to strengthen your abdominal muscles. 6. Slowly return to your starting position. Back lifts Repeat these steps 5-10 times: 1. Lie on your abdomen (face-down) with your arms at your sides, and rest your forehead on the floor. 2. Tighten the muscles in your legs and your buttocks. 3. Slowly lift your chest off the floor while you keep your hips pressed to the floor. Keep the back of your head in line with the curve in your back. Your eyes should be looking at the floor. 4. Hold this position for 3-5 seconds. 5. Slowly return to your starting position. Contact a health care provider if:  Your back pain or discomfort gets much worse when you do an exercise.  Your worsening back pain or discomfort does not lessen within 2 hours after you exercise. If you have any of these problems, stop doing these exercises right away. Do not do them again unless your health care provider says that you can. Get help right away if:  You develop sudden, severe back pain. If this happens, stop doing the exercises right away. Do not do them again unless your health care provider says that you can. This information is not intended to replace advice given to you by your health care provider. Make sure you discuss any questions you have with your health care provider. Document Revised: 10/18/2018 Document  Reviewed: 03/15/2018 Elsevier Patient Education  2020 [employer].  Thoracic Strain Rehab Ask your health care provider which exercises are safe for you. Do exercises exactly as told by your health care provider and adjust them as directed. It is normal to feel mild stretching, pulling, tightness, or discomfort as you do these exercises. Stop right away if you feel sudden pain or your pain gets worse. Do not begin these exercises until told by your health care provider. Stretching and range-of-motion exercise This exercise warms up your muscles and joints and improves the movement and flexibility of your back and shoulders. This exercise also helps to relieve pain. Chest and spine stretch  1. Lie down on your back on a firm surface. 2. Roll a towel or a small blanket so it is about 4 inches (10 cm) in diameter. 3.  Put the towel lengthwise under the middle of your back so it is under your spine, but not under your shoulder blades. 4. Put your hands behind your head and let your elbows fall to your sides. This will increase your stretch. 5. Take a deep breath (inhale). 6. Hold for __________ seconds. 7. Relax after you breathe out (exhale). Repeat __________ times. Complete this exercise __________ times a day. Strengthening exercises These exercises build strength and endurance in your back and your shoulder blade muscles. Endurance is the ability to use your muscles for a long time, even after they get tired. Alternating arm and leg raises  1. Get on your hands and knees on a firm surface. If you are on a hard floor, you may want to use padding, such as an exercise mat, to cushion your knees. 2. Line up your arms and legs. Your hands should be directly below your shoulders, and your knees should be directly below your hips. 3. Lift your left leg behind you. At the same time, raise your right arm and straighten it in front of you. ? Do not lift your leg higher than your hip. ? Do not lift  your arm higher than your shoulder. ? Keep your abdominal and back muscles tight. ? Keep your hips facing the ground. ? Do not arch your back. ? Keep your balance carefully, and do not hold your breath. 4. Hold for __________ seconds. 5. Slowly return to the starting position and repeat with your right leg and your left arm. Repeat __________ times. Complete this exercise __________ times a day. Straight arm rows This exercise is also called shoulder extension exercise. 1. Stand with your feet shoulder width apart. 2. Secure an exercise band to a stable object in front of you so the band is at or above shoulder height. 3. Hold one end of the exercise band in each hand. 4. Straighten your elbows and lift your hands up to shoulder height. 5. Step back, away from the secured end of the exercise band, until the band stretches. 6. Squeeze your shoulder blades together and pull your hands down to the sides of your thighs. Stop when your hands are straight down by your sides. This is shoulder extension. Do not let your hands go behind your body. 7. Hold for __________ seconds. 8. Slowly return to the starting position. Repeat __________ times. Complete this exercise __________ times a day. Prone shoulder external rotation 1. Lie on your abdomen on a firm bed so your left / right forearm hangs over the edge of the bed and your upper arm is on the bed, straight out from your body. This is the prone position. ? Your elbow should be bent. ? Your palm should be facing your feet. 2. If instructed, hold a __________ weight in your hand. 3. Squeeze your shoulder blade toward the middle of your back. Do not let your shoulder lift toward your ear. 4. Keep your elbow bent in a 90-degree angle (right angle) while you slowly move your forearm up toward the ceiling. Move your forearm up to the height of the bed, toward your head. This is external rotation. ? Your upper arm should not move. ? At the top of the  movement, your palm should face the floor. 5. Hold for __________ seconds. 6. Slowly return to the starting position and relax your muscles. Repeat __________ times. Complete this exercise __________ times a day. Rowing scapular retraction This is an exercise in which the shoulder blades (  scapulae) are pulled toward each other (retraction). 1. Sit in a stable chair without armrests, or stand up. 2. Secure an exercise band to a stable object in front of you so the band is at shoulder height. 3. Hold one end of the exercise band in each hand. Your palms should face down. 4. Bring your arms out straight in front of you. 5. Step back, away from the secured end of the exercise band, until the band stretches. 6. Pull the band backward. As you do this, bend your elbows and squeeze your shoulder blades together, but avoid letting the rest of your body move. Do not shrug your shoulders upward while you do this. 7. Stop when your elbows are at your sides or slightly behind your body. 8. Hold for __________ seconds. 9. Slowly straighten your arms to return to the starting position. Repeat __________ times. Complete this exercise __________ times a day. Posture and body mechanics Good posture and healthy body mechanics can help to relieve stress in your body's tissues and joints. Body mechanics refers to the movements and positions of your body while you do your daily activities. Posture is part of body mechanics. Good posture means:  Your spine is in its natural S-curve position (neutral).  Your shoulders are pulled back slightly.  Your head is not tipped forward. Follow these guidelines to improve your posture and body mechanics in your everyday activities. Standing   When standing, keep your spine neutral and your feet about hip width apart. Keep a slight bend in your knees. Your ears, shoulders, and hips should line up with each other.  When you do a task in which you lean forward while  standing in one place for a long time, place one foot up on a stable object that is 2-4 inches (5-10 cm) high, such as a footstool. This helps keep your spine neutral. Sitting   When sitting, keep your spine neutral and keep your feet flat on the floor. Use a footrest, if necessary, and keep your thighs parallel to the floor. Avoid rounding your shoulders, and avoid tilting your head forward.  When working at a desk or a computer, keep your desk at a height where your hands are slightly lower than your elbows. Slide your chair under your desk so you are close enough to maintain good posture.  When working at a computer, place your monitor at a height where you are looking straight ahead and you do not have to tilt your head forward or downward to look at the screen. Resting When lying down and resting, avoid positions that are most painful for you.  If you have pain with activities such as sitting, bending, stooping, or squatting (flexion-basedactivities), lie in a position in which your body does not bend very much. For example, avoid curling up on your side with your arms and knees near your chest (fetal position).  If you have pain with activities such as standing for a long time or reaching with your arms (extension-basedactivities), lie with your spine in a neutral position and bend your knees slightly. Try the following positions: ? Lie on your side with a pillow between your knees. ? Lie on your back with a pillow under your knees.  Lifting   When lifting objects, keep your feet at least shoulder width apart and tighten your abdominal muscles.  Bend your knees and hips and keep your spine neutral. It is important to lift using the strength of your legs, not your back.  Do not lock your knees straight out.  Always ask for help to lift heavy or awkward objects. This information is not intended to replace advice given to you by your health care provider. Make sure you discuss any  questions you have with your health care provider. Document Revised: 10/05/2018 Document Reviewed: 07/23/2018 Elsevier Patient Education  2020 [employer].

## 2020-05-25 NOTE — Assessment & Plan Note (Signed)
Will start stretches. Call with any concerns. Continue to monitor.

## 2020-05-25 NOTE — Assessment & Plan Note (Signed)
Stable on current regimen. Will continue and get him back in 3 months for labs and physical. Refills given.

## 2020-05-25 NOTE — Assessment & Plan Note (Signed)
BP doing well. Will continue and get him back in 3 months for labs and physical. Refills given.

## 2020-05-25 NOTE — Progress Notes (Signed)
BP 108/72   Pulse 99   Temp 98.6 F (37 C) (Oral)   Ht 5' 6.02" (1.677 m)   Wt 150 lb 12.8 oz (68.4 kg)   SpO2 99%   BMI 24.32 kg/m    Subjective:    Patient ID: Shawn Ware, male    DOB: 06/02/1972, 49 y.o.   MRN: [id number]  HPI: Shawn Ware is a 49 y.o. male  Chief Complaint  Patient presents with  . Left low back pain    Started on Thursday and went away on Saturday morning, Patient states he thinks he just pulled a muscle  . Depression   ANXIETY/DEPRESSION Duration: chronic Status:better Anxious mood: no  Excessive worrying: no Irritability: no  Sweating: no Nausea: no Palpitations:no Hyperventilation: no Panic attacks: no Agoraphobia: no  Obscessions/compulsions: no Depressed mood: no Depression screen [facility] 2/9 05/25/2020 02/07/2020 06/04/2019 05/08/2019 04/03/2019  Decreased Interest 0 0 3 3 2   Down, Depressed, Hopeless 0 0 2 3 2   PHQ - 2 Score 0 0 5 6 4   Altered sleeping 1 1 3 3 2   Tired, decreased energy 1 0 2 2 2   Change in appetite 0 0 2 2 1   Feeling bad or failure about yourself  1 0 3 2 1   Trouble concentrating 0 0 1 3 1   Moving slowly or fidgety/restless 1 0 1 0 1  Suicidal thoughts 0 0 0 0 0  PHQ-9 Score 4 1 17 18 12   Difficult doing work/chores Somewhat difficult - Somewhat difficult Somewhat difficult Somewhat difficult  Some recent data might be hidden   Anhedonia: no Weight changes: no Insomnia: yes hard to fall asleep  Hypersomnia: no Fatigue/loss of energy: yes Feelings of worthlessness: no Feelings of guilt: no Impaired concentration/indecisiveness: no Suicidal ideations: no  Crying spells: no Recent Stressors/Life Changes: no   Relationship problems: no   Family stress: no     Financial stress: no    Job stress: no    Recent death/loss: no  INSOMNIA Duration: chronic Satisfied with sleep quality: yes Difficulty falling asleep: no Difficulty staying asleep: no Waking a few hours after sleep onset: no Early  morning awakenings: no Daytime hypersomnolence: no Wakes feeling refreshed: no Good sleep hygiene: yes Apnea: no Snoring: no Depressed/anxious mood: yes Recent stress: no Restless legs/nocturnal leg cramps: no Chronic pain/arthritis: no History of sleep study: no Treatments attempted: lunesta, melatonin, uinsom, benadryl and ambien   BACK PAIN Duration: about 5-6 days ago, resolved now  Mechanism of injury: unknown Location: L mid back Onset: sudden Severity: mild Quality: dull, ache Frequency: 2-3 days constant Radiation: none Aggravating factors: nothing Alleviating factors: having a BM Status: better Treatments attempted: nothing   Relief with NSAIDs?: No NSAIDs Taken Nighttime pain:  no Paresthesias / decreased sensation:  no Bowel / bladder incontinence:  no Fevers:  no Dysuria / urinary frequency:  no  Relevant past medical, surgical, family and social history reviewed and updated as indicated. Interim medical history since our last visit reviewed. Allergies and medications reviewed and updated.  Review of Systems  Constitutional: Negative.   Respiratory: Negative.   Cardiovascular: Negative.   Gastrointestinal: Negative.   Musculoskeletal: Positive for back pain and myalgias. Negative for arthralgias, gait problem, joint swelling, neck pain and neck stiffness.  Skin: Negative.   Neurological: Negative.   Psychiatric/Behavioral: Negative.     Per HPI unless specifically indicated above     Objective:    BP 108/72   Pulse 99  Temp 98.6 F (37 C) (Oral)   Ht 5' 6.02" (1.677 m)   Wt 150 lb 12.8 oz (68.4 kg)   SpO2 99%   BMI 24.32 kg/m   Wt Readings from Last 3 Encounters:  05/25/20 150 lb 12.8 oz (68.4 kg)  04/01/20 152 lb (68.9 kg)  02/07/20 155 lb 9.6 oz (70.6 kg)    Physical Exam Vitals and nursing note reviewed.  Constitutional:      Shawn: He is not in acute distress.    Appearance: Normal appearance. He is not ill-appearing,  toxic-appearing or diaphoretic.  HENT:     Head: Normocephalic and atraumatic.     Right Ear: External ear normal.     Left Ear: External ear normal.     Nose: Nose normal.     Mouth/Throat:     Mouth: Mucous membranes are moist.     Pharynx: Oropharynx is clear.  Eyes:     Shawn: No scleral icterus.       Right eye: No discharge.        Left eye: No discharge.     Extraocular Movements: Extraocular movements intact.     Conjunctiva/sclera: Conjunctivae normal.     Pupils: Pupils are equal, round, and reactive to light.  Cardiovascular:     Rate and Rhythm: Normal rate and regular rhythm.     Pulses: Normal pulses.     Heart sounds: Normal heart sounds. No murmur heard.  No friction rub. No gallop.   Pulmonary:     Effort: Pulmonary effort is normal. No respiratory distress.     Breath sounds: Normal breath sounds. No stridor. No wheezing, rhonchi or rales.  Chest:     Chest wall: No tenderness.  Musculoskeletal:        Shawn: Normal range of motion.     Cervical back: Normal range of motion and neck supple.  Skin:    Shawn: Skin is warm and dry.     Capillary Refill: Capillary refill takes less than 2 seconds.     Coloration: Skin is not jaundiced or pale.     Findings: No bruising, erythema, lesion or rash.  Neurological:     Shawn: No focal deficit present.     Mental Status: He is alert and oriented to person, place, and time. Mental status is at baseline.  Psychiatric:        Mood and Affect: Mood normal.        Behavior: Behavior normal.        Thought Content: Thought content normal.        Judgment: Judgment normal.     Results for orders placed or performed in visit on 06/05/18  Measles/Mumps/Rubella Immunity  Result Value Ref Range   Rubella Antibodies, IGG 2.11 Immune >0.99 index   RUBEOLA AB, IGG >300.0 Immune >16.4 AU/mL   MUMPS ABS, IGG 15.0 Immune >10.9 AU/mL      Assessment & Plan:   Problem List Items Addressed This Visit       Cardiovascular and Mediastinum   Hypertension    BP doing well. Will continue and get him back in 3 months for labs and physical. Refills given.       Relevant Medications   amLODipine (NORVASC) 10 MG tablet   atorvastatin (LIPITOR) 40 MG tablet     Other   Hyperlipidemia    Stable on current regimen. Will continue and get him back in 3 months for labs and physical. Refills given.  Relevant Medications   amLODipine (NORVASC) 10 MG tablet   atorvastatin (LIPITOR) 40 MG tablet   Depression, major, single episode, moderate (HCC)    Under good control on current regimen. Continue current regimen. Continue to monitor. Call with any concerns. Refills given. 30 pills should last at least 3 months. Call with any concerns.         Insomnia    Under good control on current regimen. Continue current regimen. Continue to monitor. Call with any concerns. Refills given for 3 months. Follow up 3 months.         Back pain    Will start stretches. Call with any concerns. Continue to monitor.        Other Visit Diagnoses    Flank pain    -  Primary   Went to UC with negative UA. Call with any concerns.    Relevant Orders   Urinalysis, Routine w reflex microscopic       Follow up plan: Return 3 months physical.

## 2020-05-25 NOTE — Assessment & Plan Note (Signed)
Under good control on current regimen. Continue current regimen. Continue to monitor. Call with any concerns. Refills given for 3 months. Follow up 3 months.    

## 2020-06-08 DIAGNOSIS — H02846 Edema of left eye, unspecified eyelid: Secondary | ICD-10-CM | POA: Diagnosis not present

## 2020-06-08 DIAGNOSIS — H02843 Edema of right eye, unspecified eyelid: Secondary | ICD-10-CM | POA: Diagnosis not present

## 2020-06-28 ENCOUNTER — Other Ambulatory Visit: Payer: Self-pay | Admitting: Family Medicine

## 2020-06-29 NOTE — Telephone Encounter (Signed)
Requested medication (s) are due for refill today: yes  Requested medication (s) are on the active medication list: yes  Last refill:  05/25/20 #30  Future visit scheduled: yes  Notes to clinic:  Please review for refill. Refill not delegated per protocol    Requested Prescriptions  Pending Prescriptions Disp Refills   clonazePAM (KLONOPIN) 0.5 MG tablet [Pharmacy Med Name: CLONAZEPAM 0.5 MG TAB] 30 tablet     Sig: TAKE 1 TABLET BY MOUTH ONCE DAILY AS NEEDED ANXIETY      Not Delegated - Psychiatry:  Anxiolytics/Hypnotics Failed - 06/28/2020  7:31 AM      Failed - This refill cannot be delegated      Failed - Urine Drug Screen completed in last 360 days      Passed - Valid encounter within last 6 months    Recent Outpatient Visits           1 month ago Flank pain   [facility] [place], [name], DO   2 months ago Eustachian tube dysfunction, bilateral   [facility] [place], [place] T, NP   4 months ago Essential hypertension   [facility] [place], [place], DO   12 months ago Insomnia, unspecified type   [facility] [employer], [name], DO   1 year ago Depression, major, single episode, moderate (HCC)   [facility] [place], [place], DO       Future Appointments             In 2 months [name], [name], DO [facility], [facility]
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
## 2020-08-21 ENCOUNTER — Ambulatory Visit (INDEPENDENT_AMBULATORY_CARE_PROVIDER_SITE_OTHER): Payer: Self-pay | Admitting: Family Medicine

## 2020-08-21 ENCOUNTER — Other Ambulatory Visit: Payer: Self-pay

## 2020-08-21 VITALS — BP 108/71 | HR 76 | Temp 98.6°F | Wt 148.0 lb

## 2020-08-21 DIAGNOSIS — I10 Essential (primary) hypertension: Secondary | ICD-10-CM

## 2020-08-21 DIAGNOSIS — E782 Mixed hyperlipidemia: Secondary | ICD-10-CM

## 2020-08-21 DIAGNOSIS — G4700 Insomnia, unspecified: Secondary | ICD-10-CM

## 2020-08-21 DIAGNOSIS — F321 Major depressive disorder, single episode, moderate: Secondary | ICD-10-CM

## 2020-08-21 DIAGNOSIS — J301 Allergic rhinitis due to pollen: Secondary | ICD-10-CM

## 2020-08-21 MED ORDER — AMLODIPINE BESYLATE 10 MG PO TABS
10.0000 mg | ORAL_TABLET | Freq: Every day | ORAL | 1 refills | Status: DC
Start: 2020-08-21 — End: 2021-01-22

## 2020-08-21 MED ORDER — FLUTICASONE PROPIONATE 50 MCG/ACT NA SUSP: 2.0000 | Freq: Every day | NASAL | 6 refills | Status: DC

## 2020-08-21 MED ORDER — ATORVASTATIN CALCIUM 40 MG PO TABS: 40.0000 mg | ORAL_TABLET | Freq: Every day | ORAL | 1 refills | Status: DC

## 2020-08-21 MED ORDER — CLONAZEPAM 0.5 MG PO TABS
ORAL_TABLET | ORAL | 0 refills | Status: DC
Start: 2020-08-21 — End: 2020-11-04

## 2020-08-21 MED ORDER — TRIAMCINOLONE ACETONIDE 40 MG/ML IJ SUSP
40.0000 mg | Freq: Once | INTRAMUSCULAR | Status: AC
  Administered 2020-08-21: 40 mg via INTRAMUSCULAR

## 2020-08-21 MED ORDER — PREDNISONE 50 MG PO TABS: 50.0000 mg | ORAL_TABLET | Freq: Every day | ORAL | 0 refills | Status: DC

## 2020-08-21 MED ORDER — ESZOPICLONE 3 MG PO TABS
3.0000 mg | ORAL_TABLET | Freq: Every evening | ORAL | 2 refills | Status: DC | PRN
Start: 2020-08-21 — End: 2020-11-04

## 2020-08-21 NOTE — Progress Notes (Addendum)
BP 108/71   Pulse 76   Temp 98.6 F (37 C)   Wt 148 lb (67.1 kg)   SpO2 97%   BMI 23.87 kg/m    Subjective:    Patient ID: Shawn Ware, male    DOB: 06/02/1972, 50 y.o.   MRN: [id number]  HPI: Shawn Ware is a 50 y.o. male  Chief Complaint  Patient presents with  . Facial Swelling    Patient states he was prescribed prednisone once for swelling under his eye. States it worked good and would like to have a refill   . Hypertension  . Anxiety   HYPERTENSION / HYPERLIPIDEMIA Satisfied with current treatment? yes Duration of hypertension: chronic BP monitoring frequency: not checking BP medication side effects: no Past BP meds: amlodipine Duration of hyperlipidemia: chronic Cholesterol medication side effects: no Cholesterol supplements: none Past cholesterol medications: atorvastatin Medication compliance: excellent compliance Aspirin: no Recent stressors: no Recurrent headaches: no Visual changes: no Palpitations: no Dyspnea: no Chest pain: no Lower extremity edema: no Dizzy/lightheaded: no  INSOMNIA Duration: chronic Satisfied with sleep quality: yes Difficulty falling asleep: no Difficulty staying asleep: yes Waking a few hours after sleep onset: no Early morning awakenings: yes Daytime hypersomnolence: no Wakes feeling refreshed: no Good sleep hygiene: no Apnea: no Snoring: no Depressed/anxious mood: yes Recent stress: yes Restless legs/nocturnal leg cramps: no Chronic pain/arthritis: no History of sleep study: no Treatments attempted: [place] P[place], klonopin   ANXIETY/STRESS Duration:stable Anxious mood: yes  Excessive worrying: yes Irritability: yes  Sweating: no Nausea: no Palpitations:no Hyperventilation: no Panic attacks: no Agoraphobia: no  Obscessions/compulsions: no Depressed mood: no Depression screen [facility] 2/9 05/25/2020 02/07/2020 06/04/2019 05/08/2019 04/03/2019  Decreased Interest 0 0 3 3 2   Down, Depressed,  Hopeless 0 0 2 3 2   PHQ - 2 Score 0 0 5 6 4   Altered sleeping 1 1 3 3 2   Tired, decreased energy 1 0 2 2 2   Change in appetite 0 0 2 2 1   Feeling bad or failure about yourself  1 0 3 2 1   Trouble concentrating 0 0 1 3 1   Moving slowly or fidgety/restless 1 0 1 0 1  Suicidal thoughts 0 0 0 0 0  PHQ-9 Score 4 1 17 18 12   Difficult doing work/chores Somewhat difficult - Somewhat difficult Somewhat difficult Somewhat difficult  Some recent data might be hidden   Anhedonia: no Weight changes: no Insomnia: yes hard to fall asleep  Hypersomnia: no Fatigue/loss of energy: yes Feelings of worthlessness: no Feelings of guilt: no Impaired concentration/indecisiveness: no Suicidal ideations: no  Crying spells: no Recent Stressors/Life Changes: yes   Relationship problems: yes   Family stress: no     Financial stress: no    Job stress: yes    Recent death/loss: no  Has been having swelling under his eyes with his allergies.   Relevant past medical, surgical, family and social history reviewed and updated as indicated. Interim medical history since our last visit reviewed. Allergies and medications reviewed and updated.  Review of Systems  Constitutional: Negative.   HENT: Positive for facial swelling. Negative for congestion, dental problem, drooling, ear discharge, ear pain, hearing loss, mouth sores, nosebleeds, postnasal drip, rhinorrhea, sinus pressure, sinus pain, sneezing, sore throat, tinnitus, trouble swallowing and voice change.   Respiratory: Negative.   Cardiovascular: Negative.   Gastrointestinal: Negative.   Musculoskeletal: Negative.   Psychiatric/Behavioral: Negative.     Per HPI unless specifically indicated above  Objective:    BP 108/71   Pulse 76   Temp 98.6 F (37 C)   Wt 148 lb (67.1 kg)   SpO2 97%   BMI 23.87 kg/m   Wt Readings from Last 3 Encounters:  08/21/20 148 lb (67.1 kg)  05/25/20 150 lb 12.8 oz (68.4 kg)  04/01/20 152 lb (68.9 kg)     Physical Exam Vitals and nursing note reviewed.  Constitutional:      General: He is not in acute distress.    Appearance: Normal appearance. He is not ill-appearing, toxic-appearing or diaphoretic.  HENT:     Head: Normocephalic and atraumatic.     Comments: Swelling under his eyes bilaterally, no redness or tenderness    Right Ear: External ear normal.     Left Ear: External ear normal.     Nose: Nose normal.     Mouth/Throat:     Mouth: Mucous membranes are moist.     Pharynx: Oropharynx is clear.  Eyes:     General: No scleral icterus.       Right eye: No discharge.        Left eye: No discharge.     Extraocular Movements: Extraocular movements intact.     Conjunctiva/sclera: Conjunctivae normal.     Pupils: Pupils are equal, round, and reactive to light.  Cardiovascular:     Rate and Rhythm: Normal rate and regular rhythm.     Pulses: Normal pulses.     Heart sounds: Normal heart sounds. No murmur heard. No friction rub. No gallop.   Pulmonary:     Effort: Pulmonary effort is normal. No respiratory distress.     Breath sounds: Normal breath sounds. No stridor. No wheezing, rhonchi or rales.  Chest:     Chest wall: No tenderness.  Musculoskeletal:        General: Normal range of motion.     Cervical back: Normal range of motion and neck supple.  Skin:    General: Skin is warm and dry.     Capillary Refill: Capillary refill takes less than 2 seconds.     Coloration: Skin is not jaundiced or pale.     Findings: No bruising, erythema, lesion or rash.  Neurological:     General: No focal deficit present.     Mental Status: He is alert and oriented to person, place, and time. Mental status is at baseline.  Psychiatric:        Mood and Affect: Mood normal.        Behavior: Behavior normal.        Thought Content: Thought content normal.        Judgment: Judgment normal.     Results for orders placed or performed in visit on 06/05/18  Measles/Mumps/Rubella Immunity   Result Value Ref Range   Rubella Antibodies, IGG 2.11 Immune >0.99 index   RUBEOLA AB, IGG >300.0 Immune >16.4 AU/mL   MUMPS ABS, IGG 15.0 Immune >10.9 AU/mL      Assessment & Plan:   Problem List Items Addressed This Visit      Cardiovascular and Mediastinum   Hypertension    Under good control on current regimen. Continue current regimen. Continue to monitor. Call with any concerns. Refills given. Labs to be drawn at physical. Ordered today.        Relevant Medications   amLODipine (NORVASC) 10 MG tablet   atorvastatin (LIPITOR) 40 MG tablet   Other Relevant Orders   Comprehensive metabolic panel  Microalbumin, Urine Waived     Respiratory   Allergic rhinitis - Primary     Other   Hyperlipidemia    Under good control on current regimen. Continue current regimen. Continue to monitor. Call with any concerns. Refills given. Labs to be drawn at physical. Ordered today.       Relevant Medications   amLODipine (NORVASC) 10 MG tablet   atorvastatin (LIPITOR) 40 MG tablet   Other Relevant Orders   Comprehensive metabolic panel   Lipid Panel w/o Chol/HDL Ratio   Depression, major, single episode, moderate (HCC)    Under good control on current regimen. Continue current regimen. Continue to monitor. Call with any concerns. Refills given. 30 tabs of klonopin should last at least 3 months.        Insomnia    Under good control on current regimen. Continue current regimen. Continue to monitor. Call with any concerns. Refills given for 3 months. Follow up 3 months.            Follow up plan: Return in about 3 months (around 11/18/2020) for anxiety and insomnia follow up.

## 2020-08-22 NOTE — Assessment & Plan Note (Signed)
Under good control on current regimen. Continue current regimen. Continue to monitor. Call with any concerns. Refills given. Labs to be drawn at physical. Ordered today.  

## 2020-08-22 NOTE — Assessment & Plan Note (Signed)
Under good control on current regimen. Continue current regimen. Continue to monitor. Call with any concerns. Refills given. Labs to be drawn at physical. Ordered today.

## 2020-08-22 NOTE — Assessment & Plan Note (Signed)
Under good control on current regimen. Continue current regimen. Continue to monitor. Call with any concerns. Refills given. 30 tabs of klonopin should last at least 3 months.

## 2020-08-22 NOTE — Assessment & Plan Note (Signed)
Will treat with triamcinalone shot today and start prednisone tomorrow. If not getting better, call

## 2020-08-22 NOTE — Assessment & Plan Note (Signed)
Under good control on current regimen. Continue current regimen. Continue to monitor. Call with any concerns. Refills given for 3 months. Follow up 3 months.    

## 2020-08-28 ENCOUNTER — Ambulatory Visit: Payer: BC Managed Care – PPO | Admitting: Family Medicine

## 2020-10-23 ENCOUNTER — Other Ambulatory Visit: Payer: Self-pay | Admitting: Family Medicine

## 2020-10-23 NOTE — Telephone Encounter (Signed)
Pt scheduled 5/25

## 2020-10-23 NOTE — Telephone Encounter (Signed)
Requested medication (s) are due for refill today: yes  Requested medication (s) are on the active medication list: yes  Last refill: 08/21/20  Future visit scheduled: yes  Notes to clinic:  not delegated    Requested Prescriptions  Pending Prescriptions Disp Refills   clonazePAM (KLONOPIN) 0.5 MG tablet [Pharmacy Med Name: CLONAZEPAM 0.5 MG TAB] 30 tablet     Sig: TAKE 1 TABLET BY MOUTH ONCE DAILY AS NEEDED ANXIETY      Not Delegated - Psychiatry:  Anxiolytics/Hypnotics Failed - 10/23/2020 11:14 AM      Failed - This refill cannot be delegated      Failed - Urine Drug Screen completed in last 360 days      Passed - Valid encounter within last 6 months    Recent Outpatient Visits           2 months ago Seasonal allergic rhinitis due to pollen   [facility], [name], DO   5 months ago Flank pain   [facility] [place], [name], DO   6 months ago Eustachian tube dysfunction, bilateral   [facility] [place], Dorie Rank, NP   8 months ago Essential hypertension   [facility] [place], [place], DO   1 year ago Insomnia, unspecified type   [facility] [place], [place], DO       Future Appointments             In 3 weeks Laural Benes, [name], DO [employer], [facility]

## 2020-10-28 ENCOUNTER — Other Ambulatory Visit: Payer: Self-pay | Admitting: Family Medicine

## 2020-10-28 NOTE — Telephone Encounter (Signed)
Scheduled 5/25

## 2020-10-28 NOTE — Telephone Encounter (Signed)
   Notes to clinic:  Patient has appointment on 11/18/2020 Review for refill   Requested Prescriptions  Pending Prescriptions Disp Refills   clonazePAM (KLONOPIN) 0.5 MG tablet [Pharmacy Med Name: CLONAZEPAM 0.5 MG TAB] 30 tablet     Sig: TAKE 1 TABLET BY MOUTH ONCE DAILY AS NEEDED ANXIETY      Not Delegated - Psychiatry:  Anxiolytics/Hypnotics Failed - 10/28/2020  9:39 AM      Failed - This refill cannot be delegated      Failed - Urine Drug Screen completed in last 360 days      Passed - Valid encounter within last 6 months    Recent Outpatient Visits           2 months ago Seasonal allergic rhinitis due to pollen   [facility], [name], DO   5 months ago Flank pain   [facility] [place], [name], DO   7 months ago Eustachian tube dysfunction, bilateral   [facility] [place], Dorie Rank, NP   8 months ago Essential hypertension   [facility] [place], [place], DO   1 year ago Insomnia, unspecified type   [facility] [place], [place], DO       Future Appointments             In 3 weeks Laural Benes, [name], DO [employer] [facility], [facility]

## 2020-11-04 ENCOUNTER — Encounter: Payer: Self-pay | Admitting: Family Medicine

## 2020-11-04 ENCOUNTER — Other Ambulatory Visit: Payer: Self-pay | Admitting: Family Medicine

## 2020-11-04 ENCOUNTER — Other Ambulatory Visit: Payer: Self-pay

## 2020-11-04 ENCOUNTER — Telehealth (INDEPENDENT_AMBULATORY_CARE_PROVIDER_SITE_OTHER): Payer: 59 | Admitting: Family Medicine

## 2020-11-04 VITALS — BP 125/76 | Temp 97.0°F | Ht 67.0 in | Wt 143.0 lb

## 2020-11-04 DIAGNOSIS — G4700 Insomnia, unspecified: Secondary | ICD-10-CM

## 2020-11-04 DIAGNOSIS — J301 Allergic rhinitis due to pollen: Secondary | ICD-10-CM | POA: Diagnosis not present

## 2020-11-04 DIAGNOSIS — R509 Fever, unspecified: Secondary | ICD-10-CM | POA: Diagnosis not present

## 2020-11-04 DIAGNOSIS — F321 Major depressive disorder, single episode, moderate: Secondary | ICD-10-CM | POA: Diagnosis not present

## 2020-11-04 NOTE — Telephone Encounter (Signed)
Requested medications are due for refill today.  unknown  Requested medications are on the active medications list. no  Last refill. 08/21/2020  Future visit scheduled.  Yes  Notes to clinic. Medication not delegated.  Medication appears to have been discontinued 11/04/2020

## 2020-11-04 NOTE — Progress Notes (Signed)
BP 125/76   Temp (!) 97 F (36.1 C)   Ht 5\' 7"  (1.702 m)   Wt 143 lb (64.9 kg)   BMI 22.40 kg/m    Subjective:    Patient ID: , male    DOB: 03-29-1971, 50 y.o.   MRN: [id number]  HPI: Shawn Ware is a 50 y.o. male  Chief Complaint  Patient presents with  . URI  . Insomnia  . Anxiety   Has been sick since 5/2. He has been staying home. Has not been tested. He was exhausted and having body aches. Has been having runny nose. He was running a fever for the first week. Feeling better now. Still having some body aches.   ANXIETY/STRESS- has been taking his klonopin every 3 days or so Duration: chronic Status:stable Anxious mood: yes  Excessive worrying: yes Irritability: no  Sweating: no Nausea: no Palpitations:no Hyperventilation: no Panic attacks: no Agoraphobia: no  Obscessions/compulsions: no Depressed mood: yes Depression screen [facility] 2/9 11/04/2020 05/25/2020 02/07/2020 06/04/2019 05/08/2019  Decreased Interest 1 0 0 3 3  Down, Depressed, Hopeless 1 0 0 2 3  PHQ - 2 Score 2 0 0 5 6  Altered sleeping 0 1 1 3 3   Tired, decreased energy 3 1 0 2 2  Change in appetite 2 0 0 2 2  Feeling bad or failure about yourself  0 1 0 3 2  Trouble concentrating 1 0 0 1 3  Moving slowly or fidgety/restless 1 1 0 1 0  Suicidal thoughts 0 0 0 0 0  PHQ-9 Score 9 4 1 17 18   Difficult doing work/chores Somewhat difficult Somewhat difficult - Somewhat difficult Somewhat difficult  Some recent data might be hidden   Anhedonia: no Weight changes: no Insomnia: yes hard to fall asleep  Hypersomnia: no Fatigue/loss of energy: yes Feelings of worthlessness: no Feelings of guilt: no Impaired concentration/indecisiveness: no Suicidal ideations: no  Crying spells: no Recent Stressors/Life Changes: no   Relationship problems: no   Family stress: no     Financial stress: no    Job stress: no    Recent death/loss: no  INSOMNIA Duration: chronic Satisfied  with sleep quality: yes Difficulty falling asleep: no Difficulty staying asleep: no Waking a few hours after sleep onset: no Early morning awakenings: no Daytime hypersomnolence: no Wakes feeling refreshed: no Good sleep hygiene: no Apnea: no Snoring: no Depressed/anxious mood: yes Recent stress: yes Restless legs/nocturnal leg cramps: no Chronic pain/arthritis: no History of sleep study: no Treatments attempted: melatonin, uinsom, benadryl and ambien   Relevant past medical, surgical, family and social history reviewed and updated as indicated. Interim medical history since our last visit reviewed. Allergies and medications reviewed and updated.  Review of Systems  Constitutional: Positive for fatigue. Negative for activity change, appetite change, chills, diaphoresis, fever and unexpected weight change.  HENT: Positive for congestion and facial swelling. Negative for dental problem, drooling, ear discharge, ear pain, hearing loss, mouth sores, nosebleeds, postnasal drip, rhinorrhea, sinus pressure, sinus pain, sneezing, sore throat, tinnitus, trouble swallowing and voice change.   Eyes: Negative.   Respiratory: Negative.   Cardiovascular: Negative.   Psychiatric/Behavioral: Positive for dysphoric mood. Negative for agitation, behavioral problems, confusion, decreased concentration, hallucinations, self-injury, sleep disturbance and suicidal ideas. The patient is nervous/anxious. The patient is not hyperactive.     Per HPI unless specifically indicated above     Objective:    BP 125/76   Temp (!) 97 F (36.1 C)  Ht 5\' 7"  (1.702 m)   Wt 143 lb (64.9 kg)   BMI 22.40 kg/m   Wt Readings from Last 3 Encounters:  11/04/20 143 lb (64.9 kg)  08/21/20 148 lb (67.1 kg)  05/25/20 150 lb 12.8 oz (68.4 kg)    Physical Exam Constitutional:      General: He is not in acute distress.    Appearance: Normal appearance. He is well-developed and normal weight. He is not ill-appearing,  toxic-appearing or diaphoretic.  HENT:     Head: Normocephalic and atraumatic.     Right Ear: Hearing and external ear normal.     Left Ear: Hearing and external ear normal.     Nose: Nose normal.  Eyes:     General: Lids are normal. No scleral icterus.       Right eye: No discharge.        Left eye: No discharge.     Extraocular Movements: Extraocular movements intact.     Conjunctiva/sclera: Conjunctivae normal.     Pupils: Pupils are equal, round, and reactive to light.  Pulmonary:     Effort: Pulmonary effort is normal. No respiratory distress.  Musculoskeletal:        General: Normal range of motion.     Cervical back: Normal range of motion.  Skin:    Coloration: Skin is not jaundiced or pale.     Findings: No bruising, erythema, lesion or rash.  Neurological:     General: No focal deficit present.     Mental Status: He is alert and oriented to person, place, and time.  Psychiatric:        Mood and Affect: Mood normal.        Speech: Speech normal.        Behavior: Behavior normal.        Thought Content: Thought content normal.        Judgment: Judgment normal.     Results for orders placed or performed in visit on 06/05/18  Measles/Mumps/Rubella Immunity  Result Value Ref Range   Rubella Antibodies, IGG 2.11 Immune >0.99 index   RUBEOLA AB, IGG >300.0 Immune >16.4 AU/mL   MUMPS ABS, IGG 15.0 Immune >10.9 AU/mL      Assessment & Plan:   Problem List Items Addressed This Visit      Respiratory   Allergic rhinitis    Acting up again. Will refill prednisone. If continues to be an issue, will need to see allergy.        Other   Depression, major, single episode, moderate (HCC)    Under fair control on current regimen. Using klonopin about 1-2x a week. Will continue 30 pills for 3 months. Follow up 3 months. Continue to monitor.       Insomnia    Under good control on current regimen. Continue current regimen. Continue to monitor. Call with any concerns.  Refills given for 3 months. Follow up 3 months.         Other Visit Diagnoses    Fever, unspecified fever cause    -  Primary   >10 days out. Will swab for COVID, but out of treatment and quarantine range. Continue symptomatic care. Call with any concerns.    Relevant Orders   Novel Coronavirus, NAA ([facility])       Follow up plan: Return in about 3 months (around 02/04/2021).   . This visit was completed via video visit through MyChart due to the restrictions of the COVID-19 pandemic. All  issues as above were discussed and addressed. Physical exam was done as above through visual confirmation on video through MyChart. If it was felt that the patient should be evaluated in the office, they were directed there. The patient verbally consented to this visit. . Location of the patient: home . Location of the provider: work . Those involved with this call:  . Provider: Olevia Perches, DO . CMA: Rolley Sims, CMA . Front Desk/Registration: Harriet Pho  . Time spent on call: 25 minutes with patient face to face via video conference. More than 50% of this time was spent in counseling and coordination of care. 40 minutes total spent in review of patient's record and preparation of their chart.

## 2020-11-05 ENCOUNTER — Encounter: Payer: Self-pay | Admitting: Family Medicine

## 2020-11-05 ENCOUNTER — Telehealth: Payer: Self-pay | Admitting: Family Medicine

## 2020-11-05 ENCOUNTER — Telehealth: Payer: Self-pay

## 2020-11-05 MED ORDER — PREDNISONE 50 MG PO TABS: 50.0000 mg | ORAL_TABLET | Freq: Every day | ORAL | 0 refills | Status: DC

## 2020-11-05 MED ORDER — ESZOPICLONE 3 MG PO TABS: 3.0000 mg | ORAL_TABLET | Freq: Every evening | ORAL | 2 refills | Status: DC | PRN

## 2020-11-05 MED ORDER — CLONAZEPAM 0.5 MG PO TABS: ORAL_TABLET | ORAL | 0 refills | Status: DC

## 2020-11-05 NOTE — Telephone Encounter (Signed)
Patient aware of rx at pharmacy 

## 2020-11-05 NOTE — Telephone Encounter (Signed)
Called pt left vm if pt calls back please transfer him to the office as to needing to verify dob for insurance

## 2020-11-05 NOTE — Assessment & Plan Note (Signed)
Acting up again. Will refill prednisone. If continues to be an issue, will need to see allergy.

## 2020-11-05 NOTE — Telephone Encounter (Signed)
Patient was seen virtual on 11/04/2020 and states PCP was suppose to send over prednisone, lunesta and clonazePAM (KLONOPIN) 0.5 MG tablet, patient checking on the status, please advise    [facility] [place] [place] - [address] Phone:  [phone]  Fax:  [phone]

## 2020-11-05 NOTE — Telephone Encounter (Signed)
Please advise pt seen 5/11

## 2020-11-05 NOTE — Telephone Encounter (Signed)
Pt seen yesterday virtually

## 2020-11-05 NOTE — Assessment & Plan Note (Signed)
Under fair control on current regimen. Using klonopin about 1-2x a week. Will continue 30 pills for 3 months. Follow up 3 months. Continue to monitor.

## 2020-11-05 NOTE — Assessment & Plan Note (Signed)
Under good control on current regimen. Continue current regimen. Continue to monitor. Call with any concerns. Refills given for 3 months. Follow up 3 months.    

## 2020-11-05 NOTE — Telephone Encounter (Signed)
Patient called stated that he went to phar macy was dissappointed that Rx was not there. Can be reached at Ph# [phone]

## 2020-11-05 NOTE — Telephone Encounter (Signed)
Dr. Laural Benes, was something besides the Prednisone supposed to be sent in?

## 2020-11-05 NOTE — Telephone Encounter (Signed)
Prednisone, lunesta and klonopin sent to his pharmacy earlier.

## 2020-11-05 NOTE — Telephone Encounter (Signed)
Scheduled 5/25

## 2020-11-06 NOTE — Telephone Encounter (Signed)
This was called in on Thursday- can we confirm that it got to the pharmacy?

## 2020-11-18 ENCOUNTER — Ambulatory Visit: Payer: Self-pay | Admitting: Family Medicine

## 2021-01-22 ENCOUNTER — Encounter: Payer: Self-pay | Admitting: Nurse Practitioner

## 2021-01-22 ENCOUNTER — Ambulatory Visit (INDEPENDENT_AMBULATORY_CARE_PROVIDER_SITE_OTHER): Payer: 59 | Admitting: Nurse Practitioner

## 2021-01-22 ENCOUNTER — Ambulatory Visit: Payer: Self-pay | Admitting: Nurse Practitioner

## 2021-01-22 ENCOUNTER — Other Ambulatory Visit: Payer: Self-pay

## 2021-01-22 VITALS — BP 125/80 | HR 70 | Temp 98.2°F | Wt 145.0 lb

## 2021-01-22 DIAGNOSIS — F321 Major depressive disorder, single episode, moderate: Secondary | ICD-10-CM | POA: Diagnosis not present

## 2021-01-22 DIAGNOSIS — Z79899 Other long term (current) drug therapy: Secondary | ICD-10-CM

## 2021-01-22 DIAGNOSIS — E782 Mixed hyperlipidemia: Secondary | ICD-10-CM

## 2021-01-22 DIAGNOSIS — G4700 Insomnia, unspecified: Secondary | ICD-10-CM | POA: Diagnosis not present

## 2021-01-22 DIAGNOSIS — I10 Essential (primary) hypertension: Secondary | ICD-10-CM

## 2021-01-22 MED ORDER — AMLODIPINE BESYLATE 10 MG PO TABS: 10.0000 mg | ORAL_TABLET | Freq: Every day | ORAL | 0 refills | Status: DC

## 2021-01-22 MED ORDER — ATORVASTATIN CALCIUM 40 MG PO TABS: 40.0000 mg | ORAL_TABLET | Freq: Every day | ORAL | 0 refills | Status: DC

## 2021-01-22 MED ORDER — SERTRALINE HCL 50 MG PO TABS: 50.0000 mg | ORAL_TABLET | Freq: Every day | ORAL | 0 refills | Status: DC

## 2021-01-22 MED ORDER — ESZOPICLONE 3 MG PO TABS: 3.0000 mg | ORAL_TABLET | Freq: Every evening | ORAL | 2 refills | Status: DC | PRN

## 2021-01-22 NOTE — Assessment & Plan Note (Signed)
Chronic, stable. Well controlled on current dose of amlodipine. He states he doesn't have time to have his labs drawn today. Will placed CMP, CBC, TSH, and urine microalbumin as future order for him to come next week. Will give 30 day refill supply until receive lab results. Follow-up in 6 months.

## 2021-01-22 NOTE — Assessment & Plan Note (Signed)
Chronic, stable. Continue atorvastatin. Lipid panel ordered to be drawn next week. 30 day refill given until lab results. Follow-up in 6 months.

## 2021-01-22 NOTE — Assessment & Plan Note (Signed)
Chronic, stable. Controlled with lunesta. Will continue current regimen. Denies any side effects. Refill sent to pharmacy.

## 2021-01-22 NOTE — Progress Notes (Signed)
Established Patient Office Visit  Subjective:  Patient ID: Shawn HarveyDarius Amy Ware, male    DOB: 06/02/1972  Age: 50 y.o. MRN: [id number]  CC:  Chief Complaint  Patient presents with   Depression   Insomnia   Hypertension   Hyperlipidemia    HPI Shawn Ware presents for follow-up on hypertension, hyperlipidemia, anxiety, and insomnia.   HYPERTENSION / HYPERLIPIDEMIA  Satisfied with current treatment? yes Duration of hypertension: chronic BP monitoring frequency: daily BP range: 120s/70s BP medication side effects: no Past BP meds: amlodipine Duration of hyperlipidemia: chronic Cholesterol medication side effects: no Cholesterol supplements: none Past cholesterol medications: atorvastain (lipitor) Medication compliance: good compliance Aspirin: no Recent stressors: no Recurrent headaches: no Visual changes: no Palpitations: no Dyspnea: no Chest pain: no Lower extremity edema: no Dizzy/lightheaded: no  INSOMNIA  Well controlled with lunesta. He takes it several nights during the week, not every night. Denies side effects.   ANXIETY/STRESS  Duration:stable Anxious mood: yes  Excessive worrying: yes Irritability: no  Sweating: no Nausea: no Palpitations:no Hyperventilation: no Panic attacks: no Agoraphobia: no  Obscessions/compulsions: no Depressed mood: no [facility] 01/22/2021 11/04/2020 05/25/2020 02/07/2020 06/04/2019  Decreased Interest 1 1 0 0 3  Down, Depressed, Hopeless 1 1 0 0 2  PHQ - 2 Score 2 2 0 0 5  Altered sleeping 3 0 1 1 3   Tired, decreased energy 2 3 1  0 2  Change in appetite 1 2 0 0 2  Feeling bad or failure about yourself  2 0 1 0 3  Trouble concentrating 2 1 0 0 1  Moving slowly or fidgety/restless 1 1 1  0 1  Suicidal thoughts 0 0 0 0 0  PHQ-9 Score 13 9 4 1 17   Difficult doing work/chores - Somewhat difficult Somewhat difficult - Somewhat difficult  Some recent data might be hidden   GAD 7 : Generalized  Anxiety Score 01/22/2021 08/21/2020 02/07/2020 06/04/2019  Nervous, Anxious, on Edge 3 1 1 2   Control/stop worrying 3 1 1 2   Worry too much - different things 3 1 1 2   Trouble relaxing 3 1 1 3   Restless 2 1 0 3  Easily annoyed or irritable 2 1 1 2   Afraid - awful might happen 2 0 0 2  Total GAD 7 Score 18 6 5 16   Anxiety Difficulty - - - Somewhat difficult   Anhedonia: no Weight changes: no Insomnia: yes hard to fall asleep  Hypersomnia: no Fatigue/loss of energy: yes Feelings of worthlessness: no Feelings of guilt: no Impaired concentration/indecisiveness: no Suicidal ideations: no  Crying spells: no Recent Stressors/Life Changes: no    Past Medical History:  Diagnosis Date   Chest pain    Hyperlipidemia     Past Surgical History:  Procedure Laterality Date   HERNIA REPAIR      Family History  Problem Relation Age of Onset   Heart attack Mother    Hypertension Mother    Hyperlipidemia Mother    Diabetes Maternal Grandmother    Hyperlipidemia Father     Social History   Socioeconomic History   Marital status: Married    Spouse name: Not on file   Number of children: 0   Years of education: Not on file   Highest education level: Not on file  Occupational History   Occupation: IT  Tobacco Use   Smoking status: Former    Packs/day: 0.50    Years: 17.00    Pack years: 8.50  Types: Cigarettes    Quit date: 03/26/2017    Years since quitting: 3.8   Smokeless tobacco: Never  Vaping Use   Vaping Use: Never used  Substance and Sexual Activity   Alcohol use: Yes    Comment: socially   Drug use: No   Sexual activity: Yes  Other Topics Concern   Not on file  Social History Narrative   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 He is a smoker. He denies alcohol consumption   Social Determinants of Corporate investment banker Strain: Not on file  Food Insecurity: Not on file  Transportation Needs: Not on file  Physical Activity: Not on file  Stress: Not on file   Social Connections: Not on file  Intimate Partner Violence: Not on file    Outpatient Medications Prior to Visit  Medication Sig Dispense Refill   clonazePAM (KLONOPIN) 0.5 MG tablet TAKE 1 TABLET BY MOUTH ONCE DAILY AS NEEDED ANXIETY 30 tablet 0   fluticasone (FLONASE) 50 MCG/ACT nasal spray Place 2 sprays into both nostrils daily. 16 g 6   predniSONE (DELTASONE) 50 MG tablet Take 1 tablet (50 mg total) by mouth daily with breakfast. 5 tablet 0   amLODipine (NORVASC) 10 MG tablet Take 1 tablet (10 mg total) by mouth daily. 90 tablet 1   atorvastatin (LIPITOR) 40 MG tablet Take 1 tablet (40 mg total) by mouth daily. 90 tablet 1   Eszopiclone 3 MG TABS Take 1 tablet (3 mg total) by mouth at bedtime as needed. Take immediately before bedtime 30 tablet 2   No facility-administered medications prior to visit.    Allergies  Allergen Reactions   Penicillins    Ambien [Zolpidem] Palpitations   Lisinopril Rash    ROS Review of Systems  Constitutional: Negative.   HENT: Negative.    Eyes: Negative.   Respiratory: Negative.    Cardiovascular: Negative.   Gastrointestinal: Negative.   Endocrine: Negative.   Genitourinary: Negative.   Musculoskeletal: Negative.   Skin: Negative.   Neurological: Negative.   Psychiatric/Behavioral:  The patient is nervous/anxious.      Objective:    Physical Exam Vitals and nursing note reviewed.  Constitutional:      Appearance: Normal appearance.  HENT:     Head: Normocephalic.  Eyes:     Conjunctiva/sclera: Conjunctivae normal.  Cardiovascular:     Rate and Rhythm: Normal rate and regular rhythm.     Pulses: Normal pulses.     Heart sounds: Normal heart sounds.  Pulmonary:     Effort: Pulmonary effort is normal.     Breath sounds: Normal breath sounds.  Abdominal:     Palpations: Abdomen is soft.     Tenderness: There is no abdominal tenderness.  Musculoskeletal:     Cervical back: Normal range of motion.     Right lower leg: No  edema.     Left lower leg: No edema.  Skin:    General: Skin is warm and dry.  Neurological:     General: No focal deficit present.     Mental Status: He is alert and oriented to person, place, and time.  Psychiatric:        Mood and Affect: Mood normal.        Behavior: Behavior normal.        Thought Content: Thought content normal.        Judgment: Judgment normal.    BP 125/80   Pulse 70   Temp  98.2 F (36.8 C)   Wt 145 lb (65.8 kg)   SpO2 97%   BMI 22.71 kg/m  Wt Readings from Last 3 Encounters:  01/22/21 145 lb (65.8 kg)  11/04/20 143 lb (64.9 kg)  08/21/20 148 lb (67.1 kg)     Health Maintenance Due  Topic Date Due   COVID-19 Vaccine (1) Never done   Pneumococcal Vaccine 72-20 Years old (1 - PCV) Never done   Hepatitis C Screening  Never done   COLONOSCOPY (Pts 45-40yrs Insurance coverage will need to be confirmed)  Never done    There are no preventive care reminders to display for this patient.  Lab Results  Component Value Date   TSH 1.450 08/01/2017   Lab Results  Component Value Date   WBC 7.3 08/01/2017   HGB 13.8 08/01/2017   HCT 41.3 08/01/2017   MCV 83 08/01/2017   PLT 239 08/01/2017   Lab Results  Component Value Date   NA 139 05/10/2018   K 4.0 05/10/2018   CO2 23 05/10/2018   GLUCOSE 91 05/10/2018   BUN 13 05/10/2018   CREATININE 0.82 05/10/2018   BILITOT 0.3 08/01/2017   ALKPHOS 54 08/01/2017   AST 30 08/01/2017   ALT 22 08/01/2017   PROT 7.0 08/01/2017   ALBUMIN 4.5 08/01/2017   CALCIUM 9.2 05/10/2018   ANIONGAP 7 12/11/2011   Lab Results  Component Value Date   CHOL 204 (H) 11/09/2016   Lab Results  Component Value Date   HDL 54 11/09/2016   Lab Results  Component Value Date   LDLCALC 119 (H) 11/09/2016   Lab Results  Component Value Date   TRIG 153 (H) 11/09/2016   No results found for: CHOLHDL Lab Results  Component Value Date   HGBA1C 5.0 08/01/2017      Assessment & Plan:   Problem List Items  Addressed This Visit       Cardiovascular and Mediastinum   Hypertension    Chronic, stable. Well controlled on current dose of amlodipine. He states he doesn't have time to have his labs drawn today. Will placed CMP, CBC, TSH, and urine microalbumin as future order for him to come next week. Will give 30 day refill supply until receive lab results. Follow-up in 6 months.        Relevant Medications   atorvastatin (LIPITOR) 40 MG tablet   amLODipine (NORVASC) 10 MG tablet   Other Relevant Orders   CBC with Differential/Platelet   Comprehensive metabolic panel   TSH   Microalbumin, Urine Waived     Other   Hyperlipidemia - Primary    Chronic, stable. Continue atorvastatin. Lipid panel ordered to be drawn next week. 30 day refill given until lab results. Follow-up in 6 months.        Relevant Medications   atorvastatin (LIPITOR) 40 MG tablet   amLODipine (NORVASC) 10 MG tablet   Other Relevant Orders   Lipid Panel w/o Chol/HDL Ratio   Depression, major, single episode, moderate (HCC)    Chronic, ongoing. PHQ 9 score is 13 today and GAD7 score is 18 which are higher than last visit in February. He has tried Careers information officer and lexapro in the past. Will start zoloft daily. He denies current SI/HI. He is also taking klonopin prn, 30 tablets to last 3 months. Urine drug screen ordered today, however he doesn't have time to do labwork today. He will come back next week for labs and UDS. Will not give new prescription for  klonopin at this time. Follow up in 4-6 weeks.        Relevant Medications   sertraline (ZOLOFT) 50 MG tablet   Controlled substance agreement signed   Relevant Orders   Urine drugs of abuse scrn w alc, routine (Ref Lab)   Insomnia    Chronic, stable. Controlled with lunesta. Will continue current regimen. Denies any side effects. Refill sent to pharmacy.        Discussed options for colon cancer screening, he would like to think about the options. He is losing his  health insurance in 1-2 weeks.   Meds ordered this encounter  Medications   sertraline (ZOLOFT) 50 MG tablet    Sig: Take 1 tablet (50 mg total) by mouth daily. Take 1/2 tablet daily for 2 weeks, then increase to 1 tablet daily    Dispense:  30 tablet    Refill:  0   Eszopiclone 3 MG TABS    Sig: Take 1 tablet (3 mg total) by mouth at bedtime as needed. Take immediately before bedtime    Dispense:  30 tablet    Refill:  2   atorvastatin (LIPITOR) 40 MG tablet    Sig: Take 1 tablet (40 mg total) by mouth daily.    Dispense:  30 tablet    Refill:  0   amLODipine (NORVASC) 10 MG tablet    Sig: Take 1 tablet (10 mg total) by mouth daily.    Dispense:  30 tablet    Refill:  0    Follow-up: Return in about 1 week (around 01/29/2021) for 1 week for labs, 4-6 weeks for anxiety.    Gerre Scull, NP

## 2021-01-22 NOTE — Assessment & Plan Note (Addendum)
Chronic, ongoing. PHQ 9 score is 13 today and GAD7 score is 18 which are higher than last visit in February. He has tried Careers information officer and lexapro in the past. Will start zoloft daily. He denies current SI/HI. He is also taking klonopin prn, 30 tablets to last 3 months. Urine drug screen ordered today, however he doesn't have time to do labwork today. He will come back next week for labs and UDS. Will not give new prescription for klonopin at this time. Follow up in 4-6 weeks.

## 2021-01-25 ENCOUNTER — Other Ambulatory Visit: Payer: Self-pay | Admitting: Family Medicine

## 2021-01-25 ENCOUNTER — Ambulatory Visit: Payer: Self-pay | Admitting: Family Medicine

## 2021-01-25 NOTE — Telephone Encounter (Signed)
Requested medications are due for refill today.  yes  Requested medications are on the active medications list.  yes  Last refill.   Future visit scheduled.   no  Notes to clinic.  This Rx was sent last week for approval and was approved. The problem is the start date on the medication  is 02/09/2021. Please update.

## 2021-01-25 NOTE — Telephone Encounter (Signed)
Please see previous note.

## 2021-01-25 NOTE — Telephone Encounter (Signed)
Requested Prescriptions  Pending Prescriptions Disp Refills  . Eszopiclone 3 MG TABS 30 tablet 2    Sig: Take 1 tablet (3 mg total) by mouth at bedtime as needed. Take immediately before bedtime     Not Delegated - Psychiatry:  Anxiolytics/Hypnotics Failed - 01/25/2021  5:22 PM      Failed - This refill cannot be delegated      Failed - Urine Drug Screen completed in last 360 days      Passed - Valid encounter within last 6 months    Recent Outpatient Visits          3 days ago Mixed hyperlipidemia   [facility] McElwee, Lauren A, NP   2 months ago Fever, unspecified fever cause   [facility] [place], [name], DO   5 months ago Seasonal allergic rhinitis due to pollen   [facility], [name], DO   8 months ago Flank pain   [facility], [name], DO   9 months ago Eustachian tube dysfunction, bilateral   [facility] [place], Jolene T, NP             . amLODipine (NORVASC) 10 MG tablet 30 tablet 0    Sig: Take 1 tablet (10 mg total) by mouth daily.     Cardiovascular:  Calcium Channel Blockers Passed - 01/25/2021  5:22 PM      Passed - Last BP in normal range    BP Readings from Last 1 Encounters:  01/22/21 125/80         Passed - Valid encounter within last 6 months    Recent Outpatient Visits          3 days ago Mixed hyperlipidemia   [facility] McElwee, Lauren A, NP   2 months ago Fever, unspecified fever cause   [facility] [place], [name], DO   5 months ago Seasonal allergic rhinitis due to pollen   [facility], [name], DO   8 months ago Flank pain   [facility], [name], DO   9 months ago Eustachian tube dysfunction, bilateral   [facility] [place], Dorie Rank, NP

## 2021-01-25 NOTE — Telephone Encounter (Signed)
Medication: Eszopiclone 3 MG TABS [[id number]] this medication should have been sent 01/22/21 instead has a 02/09/21 date amLODipine (NORVASC) 10 MG tablet [[id number]] - Pt did not receive this medication  Has the patient contacted their pharmacy? YES   Preferred Pharmacy (with phone number or street name): [facility] [place] [place] - [address] [place] [address] Phone: [phone] Fax: [phone] Hours: Not open 24 hours    Agent: Please be advised that RX refills may take up to 3 business days. We ask that you follow-up with your pharmacy.

## 2021-03-19 ENCOUNTER — Other Ambulatory Visit: Payer: Self-pay | Admitting: Family Medicine

## 2021-03-19 NOTE — Telephone Encounter (Signed)
Requested medications are due for refill today yes  Requested medications are on the active medication list yes  Last refill 02/05/21  Last visit 01/22/21, pt canceled 8/1 appt, did not come back for lab work. Was given 30 days worth until labs drawn.  Future visit scheduled no  Notes to clinic Please assess.

## 2021-03-22 NOTE — Telephone Encounter (Signed)
Overdue for follow up, please call to schedule.  Routing to provider for refill

## 2021-03-30 ENCOUNTER — Other Ambulatory Visit: Payer: Self-pay | Admitting: Family Medicine

## 2021-03-30 NOTE — Telephone Encounter (Signed)
Requested medication (s) are due for refill today: due 04/24/21  Requested medication (s) are on the active medication list: yes    Last refill: 01/22/21  #30   2 refills  Future visit scheduled yes 04/12/21  Notes to clinic: not delegated  Requested Prescriptions  Pending Prescriptions Disp Refills   Eszopiclone 3 MG TABS [Pharmacy Med Name: ESZOPICLONE 3 MG TAB] 30 tablet     Sig: TAKE 1 TABLET BY MOUTH AT BEDTIME AS NEEDED. TAKE IMMEDIATELTY BEFORE BEDTIME     Not Delegated - Psychiatry:  Anxiolytics/Hypnotics Failed - 03/30/2021 12:04 PM      Failed - This refill cannot be delegated      Failed - Urine Drug Screen completed in last 360 days      Passed - Valid encounter within last 6 months    Recent Outpatient Visits           2 months ago Mixed hyperlipidemia   [facility] [place], Lauren A, NP   4 months ago Fever, unspecified fever cause   [facility] [place], [name], DO   7 months ago Seasonal allergic rhinitis due to pollen   [facility], [name], DO   10 months ago Flank pain   [facility], [place], DO   12 months ago Eustachian tube dysfunction, bilateral   [facility] [place], Dorie Rank, NP       Future Appointments             In 1 week [name], [name], DO [facility]

## 2021-04-12 ENCOUNTER — Ambulatory Visit: Payer: 59 | Admitting: Family Medicine

## 2021-04-19 ENCOUNTER — Other Ambulatory Visit: Payer: Self-pay | Admitting: Family Medicine

## 2021-04-19 ENCOUNTER — Ambulatory Visit: Payer: Self-pay | Admitting: Family Medicine

## 2021-04-19 NOTE — Telephone Encounter (Signed)
Requested Prescriptions  Pending Prescriptions Disp Refills  . amLODipine (NORVASC) 10 MG tablet [Pharmacy Med Name: AMLODIPINE BESYLATE 10 MG TAB] 30 tablet 0    Sig: TAKE 1 TABLET BY MOUTH ONCE DAILY     Cardiovascular:  Calcium Channel Blockers Passed - 04/19/2021  9:49 AM      Passed - Last BP in normal range    BP Readings from Last 1 Encounters:  01/22/21 125/80         Passed - Valid encounter within last 6 months    Recent Outpatient Visits          2 months ago Mixed hyperlipidemia   [facility] [place], Lauren A, NP   5 months ago Fever, unspecified fever cause   [facility] [place], [name], DO   8 months ago Seasonal allergic rhinitis due to pollen   [facility], [name], DO   10 months ago Flank pain   [facility] [place], [place], DO   1 year ago Eustachian tube dysfunction, bilateral   [facility] Marjie Skiff, NP      Future Appointments            In 2 weeks Laural Benes, [name], DO [facility], [facility]

## 2021-04-26 ENCOUNTER — Other Ambulatory Visit: Payer: Self-pay | Admitting: Family Medicine

## 2021-04-26 NOTE — Telephone Encounter (Signed)
Medication: Eszopiclone 3 MG TABS [[id number]]   Has the patient contacted their pharmacy? YES (Agent: If no, request that the patient contact the pharmacy for the refill. If patient does not wish to contact the pharmacy document the reason why and proceed with request.) (Agent: If yes, when and what did the pharmacy advise?)  Preferred Pharmacy (with phone number or street name): [facility] - [place], [place] - [address] [place] [address] Phone: [phone] Fax: [phone] Hours: Not open 24 hours   Has the patient been seen for an appointment in the last year OR does the patient have an upcoming appointment? YES 05/06/21  Agent: Please be advised that RX refills may take up to 3 business days. We ask that you follow-up with your pharmacy.

## 2021-04-26 NOTE — Telephone Encounter (Signed)
Requested medications are due for refill today.  yes  Requested medications are on the active medications list.  yes  Last refill. 04/01/2021  Future visit scheduled.   yes  Notes to clinic.  Medication not delegated. 

## 2021-04-27 NOTE — Telephone Encounter (Signed)
Please see below and advise.

## 2021-05-06 ENCOUNTER — Ambulatory Visit (INDEPENDENT_AMBULATORY_CARE_PROVIDER_SITE_OTHER): Payer: 59 | Admitting: Family Medicine

## 2021-05-06 ENCOUNTER — Encounter: Payer: Self-pay | Admitting: Family Medicine

## 2021-05-06 ENCOUNTER — Other Ambulatory Visit: Payer: Self-pay

## 2021-05-06 VITALS — BP 124/79 | HR 68 | Temp 97.9°F | Ht 65.5 in | Wt 144.2 lb

## 2021-05-06 DIAGNOSIS — I10 Essential (primary) hypertension: Secondary | ICD-10-CM

## 2021-05-06 DIAGNOSIS — E782 Mixed hyperlipidemia: Secondary | ICD-10-CM

## 2021-05-06 DIAGNOSIS — Z125 Encounter for screening for malignant neoplasm of prostate: Secondary | ICD-10-CM | POA: Diagnosis not present

## 2021-05-06 DIAGNOSIS — Z1211 Encounter for screening for malignant neoplasm of colon: Secondary | ICD-10-CM

## 2021-05-06 DIAGNOSIS — F321 Major depressive disorder, single episode, moderate: Secondary | ICD-10-CM

## 2021-05-06 DIAGNOSIS — G4700 Insomnia, unspecified: Secondary | ICD-10-CM

## 2021-05-06 LAB — URINALYSIS, ROUTINE W REFLEX MICROSCOPIC
Bilirubin, UA: NEGATIVE
Glucose, UA: NEGATIVE
Ketones, UA: NEGATIVE
Leukocytes,UA: NEGATIVE
Nitrite, UA: NEGATIVE
Protein,UA: NEGATIVE
RBC, UA: NEGATIVE
Specific Gravity, UA: 1.025 (ref 1.005–1.030)
Urobilinogen, Ur: 0.2 mg/dL (ref 0.2–1.0)
pH, UA: 6 (ref 5.0–7.5)

## 2021-05-06 LAB — MICROALBUMIN, URINE WAIVED
Creatinine, Urine Waived: 200 mg/dL (ref 10–300)
Microalb, Ur Waived: 10 mg/L (ref 0–19)
Microalb/Creat Ratio: <30 mg/g (ref <30)

## 2021-05-06 MED ORDER — ESZOPICLONE 3 MG PO TABS: ORAL_TABLET | ORAL | 5 refills | Status: DC

## 2021-05-06 MED ORDER — CLONAZEPAM 0.5 MG PO TABS: ORAL_TABLET | ORAL | 0 refills | Status: DC

## 2021-05-06 MED ORDER — AMLODIPINE BESYLATE 10 MG PO TABS: 10.0000 mg | ORAL_TABLET | Freq: Every day | ORAL | 1 refills | Status: DC

## 2021-05-06 MED ORDER — ATORVASTATIN CALCIUM 40 MG PO TABS: 40.0000 mg | ORAL_TABLET | Freq: Every day | ORAL | 1 refills | Status: DC

## 2021-05-06 NOTE — Assessment & Plan Note (Signed)
Under good control on current regimen. Continue current regimen. Continue to monitor. Call with any concerns. Refills given. Labs drawn today.   

## 2021-05-06 NOTE — Assessment & Plan Note (Signed)
Stable. Using klonopin very occasionally. 30 pills should last at least 6 months. Call with any concerns. Continue to monitor.

## 2021-05-06 NOTE — Assessment & Plan Note (Signed)
Under good control on current regimen. Continue current regimen. Continue to monitor. Call with any concerns. Refills given for 6 months. Follow up 6 months. 

## 2021-05-06 NOTE — Progress Notes (Signed)
BP 124/79   Pulse 68   Temp 97.9 F (36.6 C)   Ht 5' 5.5" (1.664 m)   Wt 144 lb 3.2 oz (65.4 kg)   SpO2 97%   BMI 23.63 kg/m    Subjective:    Patient ID: Shawn Ware, male    DOB: 09-27-1970, 50 y.o.   MRN: [id number]  HPI: Shawn Ware is a 50 y.o. male  Chief Complaint  Patient presents with   Hypertension   Depression   Hyperlipidemia   DEPRESSION- did not take the zoloft, only needing clonazepam very occasionally Mood status: stable Satisfied with current treatment?: yes Symptom severity: mild  Duration of current treatment : chronic Side effects: no Medication compliance: excellent compliance Psychotherapy/counseling: no  Previous psychiatric medications: none Depressed mood: no Anxious mood: no Anhedonia: no Significant weight loss or gain: no Insomnia: no  Fatigue: no Feelings of worthlessness or guilt: no Impaired concentration/indecisiveness: no Suicidal ideations: no Hopelessness: no Crying spells: no Depression screen [facility] 2/9 01/22/2021 11/04/2020 05/25/2020 02/07/2020 06/04/2019  Decreased Interest 1 1 0 0 3  Down, Depressed, Hopeless 1 1 0 0 2  PHQ - 2 Score 2 2 0 0 5  Altered sleeping 3 0 1 1 3   Tired, decreased energy 2 3 1  0 2  Change in appetite 1 2 0 0 2  Feeling bad or failure about yourself  2 0 1 0 3  Trouble concentrating 2 1 0 0 1  Moving slowly or fidgety/restless 1 1 1  0 1  Suicidal thoughts 0 0 0 0 0  PHQ-9 Score 13 9 4 1 17   Difficult doing work/chores - Somewhat difficult Somewhat difficult - Somewhat difficult  Some recent data might be hidden    HYPERTENSION / HYPERLIPIDEMIA Satisfied with current treatment? yes Duration of hypertension: chronic BP monitoring frequency: not checking BP medication side effects: no Past BP meds: amlodipine Duration of hyperlipidemia: chronic Cholesterol medication side effects: no Cholesterol supplements: none Past cholesterol medications: atorvastatin Medication  compliance: excellent compliance Aspirin: no Recent stressors: no Recurrent headaches: no Visual changes: no Palpitations: no Dyspnea: no Chest pain: no Lower extremity edema: no Dizzy/lightheaded: no  INSOMNIA Duration: chronic Satisfied with sleep quality: yes Difficulty falling asleep: no Difficulty staying asleep: no Waking a few hours after sleep onset: no Early morning awakenings: no Daytime hypersomnolence: no Wakes feeling refreshed: no Good sleep hygiene: no Apnea: no Snoring: no Depressed/anxious mood: no Recent stress: no Restless legs/nocturnal leg cramps: no Chronic pain/arthritis: no History of sleep study: no Treatments attempted:  lunesta, melatonin, uinsom, benadryl, and ambien   Relevant past medical, surgical, family and social history reviewed and updated as indicated. Interim medical history since our last visit reviewed. Allergies and medications reviewed and updated.  Review of Systems  Constitutional: Negative.   Respiratory: Negative.    Cardiovascular: Negative.   Gastrointestinal: Negative.        Stool change  Musculoskeletal: Negative.   Skin: Negative.   Psychiatric/Behavioral: Negative.     Per HPI unless specifically indicated above     Objective:    BP 124/79   Pulse 68   Temp 97.9 F (36.6 C)   Ht 5' 5.5" (1.664 m)   Wt 144 lb 3.2 oz (65.4 kg)   SpO2 97%   BMI 23.63 kg/m   Wt Readings from Last 3 Encounters:  05/06/21 144 lb 3.2 oz (65.4 kg)  01/22/21 145 lb (65.8 kg)  11/04/20 143 lb (64.9 kg)  Physical Exam Vitals and nursing note reviewed.  Constitutional:      General: He is not in acute distress.    Appearance: Normal appearance. He is not ill-appearing, toxic-appearing or diaphoretic.  HENT:     Head: Normocephalic and atraumatic.     Right Ear: External ear normal.     Left Ear: External ear normal.     Nose: Nose normal.     Mouth/Throat:     Mouth: Mucous membranes are moist.     Pharynx: Oropharynx  is clear.  Eyes:     General: No scleral icterus.       Right eye: No discharge.        Left eye: No discharge.     Extraocular Movements: Extraocular movements intact.     Conjunctiva/sclera: Conjunctivae normal.     Pupils: Pupils are equal, round, and reactive to light.  Cardiovascular:     Rate and Rhythm: Normal rate and regular rhythm.     Pulses: Normal pulses.     Heart sounds: Normal heart sounds. No murmur heard.   No friction rub. No gallop.  Pulmonary:     Effort: Pulmonary effort is normal. No respiratory distress.     Breath sounds: Normal breath sounds. No stridor. No wheezing, rhonchi or rales.  Chest:     Chest wall: No tenderness.  Musculoskeletal:        General: Normal range of motion.     Cervical back: Normal range of motion and neck supple.  Skin:    General: Skin is warm and dry.     Capillary Refill: Capillary refill takes less than 2 seconds.     Coloration: Skin is not jaundiced or pale.     Findings: No bruising, erythema, lesion or rash.  Neurological:     General: No focal deficit present.     Mental Status: He is alert and oriented to person, place, and time. Mental status is at baseline.  Psychiatric:        Mood and Affect: Mood normal.        Behavior: Behavior normal.        Thought Content: Thought content normal.        Judgment: Judgment normal.    Results for orders placed or performed in visit on 06/05/18  Measles/Mumps/Rubella Immunity  Result Value Ref Range   Rubella Antibodies, IGG 2.11 Immune >0.99 index   RUBEOLA AB, IGG >300.0 Immune >16.4 AU/mL   MUMPS ABS, IGG 15.0 Immune >10.9 AU/mL      Assessment & Plan:   Problem List Items Addressed This Visit       Cardiovascular and Mediastinum   Hypertension - Primary    Under good control on current regimen. Continue current regimen. Continue to monitor. Call with any concerns. Refills given. Labs drawn today.        Relevant Medications   atorvastatin (LIPITOR) 40 MG  tablet   amLODipine (NORVASC) 10 MG tablet   Other Relevant Orders   Comprehensive metabolic panel   CBC with Differential/Platelet   Lipid Panel w/o Chol/HDL Ratio   TSH   Urinalysis, Routine w reflex microscopic   Microalbumin, Urine Waived     Other   Hyperlipidemia    Under good control on current regimen. Continue current regimen. Continue to monitor. Call with any concerns. Refills given. Labs drawn today.       Relevant Medications   atorvastatin (LIPITOR) 40 MG tablet   amLODipine (NORVASC) 10 MG tablet  Other Relevant Orders   Comprehensive metabolic panel   CBC with Differential/Platelet   Lipid Panel w/o Chol/HDL Ratio   Depression, major, single episode, moderate (HCC)    Stable. Using klonopin very occasionally. 30 pills should last at least 6 months. Call with any concerns. Continue to monitor.       Relevant Orders   [id number] 11+Oxyco+Alc+Crt-Bund   Insomnia    Under good control on current regimen. Continue current regimen. Continue to monitor. Call with any concerns. Refills given for 6 months. Follow up 6 months.       Other Visit Diagnoses     Screening for prostate cancer       Labs drawn today. Await results. Treat as needed.    Relevant Orders   PSA   Screening for colon cancer       Colonoscopy ordered today.    Relevant Orders   Ambulatory referral to Gastroenterology        Follow up plan: Return in about 6 months (around 11/03/2021), or physical.

## 2021-05-07 LAB — CBC WITH DIFFERENTIAL/PLATELET
Basophils Absolute: 0.1 10*3/uL (ref 0.0–0.2)
Basos: 1 %
EOS (ABSOLUTE): 0.1 10*3/uL (ref 0.0–0.4)
Eos: 1 %
Hematocrit: 46.6 % (ref 37.5–51.0)
Hemoglobin: 15 g/dL (ref 13.0–17.7)
Immature Grans (Abs): 0 10*3/uL (ref 0.0–0.1)
Immature Granulocytes: 0 %
Lymphocytes Absolute: 2.8 10*3/uL (ref 0.7–3.1)
Lymphs: 33 %
MCH: 27.4 pg (ref 26.6–33.0)
MCHC: 32.2 g/dL (ref 31.5–35.7)
MCV: 85 fL (ref 79–97)
Monocytes Absolute: 0.5 10*3/uL (ref 0.1–0.9)
Monocytes: 6 %
Neutrophils Absolute: 5.1 10*3/uL (ref 1.4–7.0)
Neutrophils: 59 %
Platelets: 273 10*3/uL (ref 150–450)
RBC: 5.47 x10E6/uL (ref 4.14–5.80)
RDW: 12.8 % (ref 11.6–15.4)
WBC: 8.6 10*3/uL (ref 3.4–10.8)

## 2021-05-07 LAB — COMPREHENSIVE METABOLIC PANEL
ALT: 16 IU/L (ref 0–44)
AST: 23 IU/L (ref 0–40)
Albumin/Globulin Ratio: 2 (ref 1.2–2.2)
Albumin: 5.1 g/dL — ABNORMAL HIGH (ref 4.0–5.0)
Alkaline Phosphatase: 62 IU/L (ref 44–121)
BUN/Creatinine Ratio: 15 (ref 9–20)
BUN: 14 mg/dL (ref 6–24)
Bilirubin Total: 0.4 mg/dL (ref 0.0–1.2)
CO2: 21 mmol/L (ref 20–29)
Calcium: 9.3 mg/dL (ref 8.7–10.2)
Chloride: 101 mmol/L (ref 96–106)
Creatinine, Ser: 0.95 mg/dL (ref 0.76–1.27)
Globulin, Total: 2.5 g/dL (ref 1.5–4.5)
Glucose: 91 mg/dL (ref 70–99)
Potassium: 4.2 mmol/L (ref 3.5–5.2)
Sodium: 140 mmol/L (ref 134–144)
Total Protein: 7.6 g/dL (ref 6.0–8.5)
eGFR: 99 mL/min/{1.73_m2} (ref >59)

## 2021-05-07 LAB — LIPID PANEL W/O CHOL/HDL RATIO
Cholesterol, Total: 173 mg/dL (ref 100–199)
HDL: 74 mg/dL (ref >39)
LDL Chol Calc (NIH): 87 mg/dL (ref 0–99)
Triglycerides: 64 mg/dL (ref 0–149)
VLDL Cholesterol Cal: 12 mg/dL (ref 5–40)

## 2021-05-07 LAB — DRUG SCREEN 764883 11+OXYCO+ALC+CRT-BUND
Amphetamines, Urine: NEGATIVE ng/mL
BENZODIAZ UR QL: NEGATIVE ng/mL
Barbiturate: NEGATIVE ng/mL
Cannabinoid Quant, Ur: NEGATIVE ng/mL
Cocaine (Metabolite): NEGATIVE ng/mL
Creatinine: 148 mg/dL (ref 20.0–300.0)
Ethanol: NEGATIVE %
Meperidine: NEGATIVE ng/mL
Methadone Screen, Urine: NEGATIVE ng/mL
OPIATE SCREEN URINE: NEGATIVE ng/mL
Oxycodone/Oxymorphone, Urine: NEGATIVE ng/mL
Phencyclidine: NEGATIVE ng/mL
Propoxyphene: NEGATIVE ng/mL
Tramadol: NEGATIVE ng/mL
pH, Urine: 5.9 (ref 4.5–8.9)

## 2021-05-07 LAB — PSA: Prostate Specific Ag, Serum: 2.9 ng/mL (ref 0.0–4.0)

## 2021-05-07 LAB — TSH: TSH: 2.85 u[IU]/mL (ref 0.450–4.500)

## 2021-06-26 ENCOUNTER — Other Ambulatory Visit: Payer: Self-pay | Admitting: Family Medicine

## 2021-06-26 NOTE — Telephone Encounter (Signed)
Requested medication (s) are due for refill today: yes  Requested medication (s) are on the active medication list: yes  Last refill:  05/06/21  Future visit scheduled: no  Notes to clinic:  med not delegated to NT to RF   Requested Prescriptions  Pending Prescriptions Disp Refills   clonazePAM (KLONOPIN) 0.5 MG tablet [Pharmacy Med Name: CLONAZEPAM 0.5 MG TAB] 30 tablet     Sig: TAKE 1 TABLET BY MOUTH ONCE DAILY AS NEEDED     Not Delegated - Psychiatry:  Anxiolytics/Hypnotics Failed - 06/26/2021 12:49 PM      Failed - This refill cannot be delegated      Passed - Urine Drug Screen completed in last 360 days      Passed - Valid encounter within last 6 months    Recent Outpatient Visits           1 month ago Primary hypertension   [facility] [place], [name], DO   5 months ago Mixed hyperlipidemia   [facility] [place], Lauren A, NP   7 months ago Fever, unspecified fever cause   [facility], [name], DO   10 months ago Seasonal allergic rhinitis due to pollen   [facility], [name], DO   1 year ago Flank pain   [facility] [place], [place], DO

## 2021-06-29 ENCOUNTER — Other Ambulatory Visit: Payer: Self-pay | Admitting: Family Medicine

## 2021-06-29 NOTE — Telephone Encounter (Signed)
Lmom asking pt to call back to schedule an appt. °

## 2021-06-29 NOTE — Telephone Encounter (Signed)
Pt called to get a short refill for Eszopiclone 3 MG TABS and  amLODipine (NORVASC) 10 MG tablet / pt stated he lost these meds while on vacation and needs a short supply called into [facility] until his normal refills are due / please advise     [place], [address] [place] [address]  Phone:  [phone]  Fax:  [phone]

## 2021-06-30 ENCOUNTER — Telehealth: Payer: Self-pay | Admitting: Family Medicine

## 2021-06-30 MED ORDER — AMLODIPINE BESYLATE 10 MG PO TABS: 10.0000 mg | ORAL_TABLET | Freq: Every day | ORAL | 1 refills | Status: DC

## 2021-06-30 NOTE — Telephone Encounter (Signed)
He has refills on the [place]- his insurance will not pay for it early and I don't know what it costs. I've sent in a refill on the amlodipine, but he may need to pay out of pocket for it.

## 2021-06-30 NOTE — Telephone Encounter (Signed)
Routing to provider. Please see message from patient.

## 2021-06-30 NOTE — Telephone Encounter (Signed)
Pt came in the office to request a courtesy refill for his prescription- Eszopiclone 3 MG.  Pt states that he lost some of his medication and asked if he can have enough medication to be sent until he can pick up his prescription Friday.  I called [facility] to confirm if they have a prescription available and they said pt received a 30 day supply on 12/9 and the next available for refill is Friday, 07/05/2021.  Advised that I would send request.  Please call pt at [phone] with any questions.  Eszopiclone 3 MG TABS [[id number]]

## 2021-06-30 NOTE — Telephone Encounter (Signed)
Requested medication (s) are due for refill today:   No  See pt note.   Medications lost on vacation.  Requested medication (s) are on the active medication list:   Yes for both  Future visit scheduled:   Yes in 2 days   Last ordered: Eszopiclone 05/06/2021 #30, 0 - non delegated refill;     Amlodipine 05/06/2021 #90, 1 refill  Returned because pt lost medications.   See pt request.     Requested Prescriptions  Pending Prescriptions Disp Refills   Eszopiclone 3 MG TABS 30 tablet 5    Sig: TAKE 1 TABLET BY MOUTH AT BEDTIME AS NEEDED. TAKE IMMEDIATELTY BEFORE BEDTIME     Not Delegated - Psychiatry:  Anxiolytics/Hypnotics Failed - 06/29/2021  6:39 PM      Failed - This refill cannot be delegated      Passed - Urine Drug Screen completed in last 360 days      Passed - Valid encounter within last 6 months    Recent Outpatient Visits           1 month ago Primary hypertension   [facility] [place], [name], DO   5 months ago Mixed hyperlipidemia   [facility] [place], Lauren A, NP   7 months ago Fever, unspecified fever cause   [facility], [name], DO   10 months ago Seasonal allergic rhinitis due to pollen   [facility], [place], DO   1 year ago Flank pain   [facility] [place], [place], DO       Future Appointments             In 2 days [name], [name], DO [facility]             amLODipine (NORVASC) 10 MG tablet 90 tablet 1    Sig: Take 1 tablet (10 mg total) by mouth daily.     Cardiovascular:  Calcium Channel Blockers Passed - 06/29/2021  6:39 PM      Passed - Last BP in normal range    BP Readings from Last 1 Encounters:  05/06/21 124/79          Passed - Valid encounter within last 6 months    Recent Outpatient Visits           1 month ago Primary hypertension   [facility] [place], [name], DO   5 months ago Mixed hyperlipidemia   [facility] [place], Lauren A, NP   7 months ago Fever, unspecified fever cause   [facility], [name], DO   10 months ago Seasonal allergic rhinitis due to pollen   [facility], [place], DO   1 year ago Flank pain   [facility] [place], [name], DO       Future Appointments             In 2 days Laural Benes, [name], DO [facility]

## 2021-07-01 MED ORDER — ESZOPICLONE 3 MG PO TABS: ORAL_TABLET | ORAL | 0 refills | Status: DC

## 2021-07-02 ENCOUNTER — Ambulatory Visit: Payer: Self-pay | Admitting: Family Medicine

## 2021-07-06 ENCOUNTER — Encounter: Payer: Self-pay | Admitting: Family Medicine

## 2021-07-06 ENCOUNTER — Telehealth (INDEPENDENT_AMBULATORY_CARE_PROVIDER_SITE_OTHER): Payer: 59 | Admitting: Family Medicine

## 2021-07-06 DIAGNOSIS — F321 Major depressive disorder, single episode, moderate: Secondary | ICD-10-CM

## 2021-07-06 MED ORDER — CLONAZEPAM 0.5 MG PO TABS: ORAL_TABLET | ORAL | 0 refills | Status: DC

## 2021-07-06 NOTE — Assessment & Plan Note (Signed)
Not doing well. Does not want preventative medicine. Would like to go up on his clonazepam as the 0.5mg  does not seem to help as much. Discussed that we do not prescribe daily clonazepam without a preventative medicine and that I would not go up to 1mg  without him being on a preventative medicine consistently. Will give a refill on his clonazepam for now. 45 pills must last at least 4-6 weeks with goal of lasting longer. Recheck 4-6 weeks. Call with any concerns.

## 2021-07-06 NOTE — Progress Notes (Signed)
BP 120/76    Subjective:    Patient ID: Shawn Ware, male    DOB: 12/26/1970, 51 y.o.   MRN: [id number]  HPI: Shawn Ware is a 51 y.o. male  Chief Complaint  Patient presents with   Depression    Patient is requesting refill on Clonazepam and requesting dosage to be increased.   ANXIETY/DEPRESSION- has been needing his clonazepam 1/2 every 2-3 days Duration: chronic Status:exacerbated Anxious mood: yes  Excessive worrying: yes Irritability: yes  Sweating: no Nausea: yes Palpitations:yes Hyperventilation: yes Panic attacks: yes Agoraphobia: no  Obscessions/compulsions: no Depressed mood: yes Depression screen [facility] 2/9 07/06/2021 05/07/2021 01/22/2021 11/04/2020 05/25/2020  Decreased Interest 2 0 1 1 0  Down, Depressed, Hopeless 2 0 1 1 0  PHQ - 2 Score 4 0 2 2 0  Altered sleeping 0 2 3 0 1  Tired, decreased energy 1 1 2 3 1   Change in appetite 0 0 1 2 0  Feeling bad or failure about yourself  1 0 2 0 1  Trouble concentrating 1 0 2 1 0  Moving slowly or fidgety/restless 0 0 1 1 1   Suicidal thoughts 0 0 0 0 0  PHQ-9 Score 7 3 13 9 4   Difficult doing work/chores Somewhat difficult - - Somewhat difficult Somewhat difficult  Some recent data might be hidden   GAD 7 : Generalized Anxiety Score 07/06/2021 05/07/2021 01/22/2021 08/21/2020  Nervous, Anxious, on Edge 2 0 3 1  Control/stop worrying 2 1 3 1   Worry too much - different things 3 1 3 1   Trouble relaxing 3 1 3 1   Restless 0 0 2 1  Easily annoyed or irritable 1 0 2 1  Afraid - awful might happen 2 0 2 0  Total GAD 7 Score 13 3 18 6   Anxiety Difficulty Very difficult - - -   Anhedonia: no Weight changes: no Insomnia: no   Hypersomnia: no Fatigue/loss of energy: yes Feelings of worthlessness: no Feelings of guilt: no Impaired concentration/indecisiveness: no Suicidal ideations: no  Crying spells: no Recent Stressors/Life Changes: yes   Relationship problems: yes   Family stress: yes      Financial stress: no    Job stress: yes    Recent death/loss: no  Relevant past medical, surgical, family and social history reviewed and updated as indicated. Interim medical history since our last visit reviewed. Allergies and medications reviewed and updated.  Review of Systems  Constitutional: Negative.   Respiratory: Negative.    Cardiovascular: Negative.   Musculoskeletal: Negative.   Neurological: Negative.   Psychiatric/Behavioral:  Positive for dysphoric mood and sleep disturbance. Negative for agitation, behavioral problems, confusion, decreased concentration, hallucinations, self-injury and suicidal ideas. The patient is nervous/anxious. The patient is not hyperactive.    Per HPI unless specifically indicated above     Objective:    BP 120/76   Wt Readings from Last 3 Encounters:  05/06/21 144 lb 3.2 oz (65.4 kg)  01/22/21 145 lb (65.8 kg)  11/04/20 143 lb (64.9 kg)    Physical Exam Vitals and nursing note reviewed.  Constitutional:      General: He is not in acute distress.    Appearance: Normal appearance. He is not ill-appearing, toxic-appearing or diaphoretic.  HENT:     Head: Normocephalic and atraumatic.     Right Ear: External ear normal.     Left Ear: External ear normal.     Nose: Nose normal.     Mouth/Throat:  Mouth: Mucous membranes are moist.     Pharynx: Oropharynx is clear.  Eyes:     General: No scleral icterus.       Right eye: No discharge.        Left eye: No discharge.     Conjunctiva/sclera: Conjunctivae normal.     Pupils: Pupils are equal, round, and reactive to light.  Pulmonary:     Effort: Pulmonary effort is normal. No respiratory distress.     Comments: Speaking in full sentences Musculoskeletal:        General: Normal range of motion.     Cervical back: Normal range of motion.  Skin:    Coloration: Skin is not jaundiced or pale.     Findings: No bruising, erythema, lesion or rash.  Neurological:     Mental Status: He is  alert and oriented to person, place, and time. Mental status is at baseline.  Psychiatric:        Mood and Affect: Mood normal.        Behavior: Behavior normal.        Thought Content: Thought content normal.        Judgment: Judgment normal.    Results for orders placed or performed in visit on 05/06/21  Comprehensive metabolic panel  Result Value Ref Range   Glucose 91 70 - 99 mg/dL   BUN 14 6 - 24 mg/dL   Creatinine, Ser 0.95 0.76 - 1.27 mg/dL   eGFR 99 >59 mL/min/1.73   BUN/Creatinine Ratio 15 9 - 20   Sodium 140 134 - 144 mmol/L   Potassium 4.2 3.5 - 5.2 mmol/L   Chloride 101 96 - 106 mmol/L   CO2 21 20 - 29 mmol/L   Calcium 9.3 8.7 - 10.2 mg/dL   Total Protein 7.6 6.0 - 8.5 g/dL   Albumin 5.1 (H) 4.0 - 5.0 g/dL   Globulin, Total 2.5 1.5 - 4.5 g/dL   Albumin/Globulin Ratio 2.0 1.2 - 2.2   Bilirubin Total 0.4 0.0 - 1.2 mg/dL   Alkaline Phosphatase 62 44 - 121 IU/L   AST 23 0 - 40 IU/L   ALT 16 0 - 44 IU/L  CBC with Differential/Platelet  Result Value Ref Range   WBC 8.6 3.4 - 10.8 x10E3/uL   RBC 5.47 4.14 - 5.80 x10E6/uL   Hemoglobin 15.0 13.0 - 17.7 g/dL   Hematocrit 46.6 37.5 - 51.0 %   MCV 85 79 - 97 fL   MCH 27.4 26.6 - 33.0 pg   MCHC 32.2 31.5 - 35.7 g/dL   RDW 12.8 11.6 - 15.4 %   Platelets 273 150 - 450 x10E3/uL   Neutrophils 59 Not Estab. %   Lymphs 33 Not Estab. %   Monocytes 6 Not Estab. %   Eos 1 Not Estab. %   Basos 1 Not Estab. %   Neutrophils Absolute 5.1 1.4 - 7.0 x10E3/uL   Lymphocytes Absolute 2.8 0.7 - 3.1 x10E3/uL   Monocytes Absolute 0.5 0.1 - 0.9 x10E3/uL   EOS (ABSOLUTE) 0.1 0.0 - 0.4 x10E3/uL   Basophils Absolute 0.1 0.0 - 0.2 x10E3/uL   Immature Granulocytes 0 Not Estab. %   Immature Grans (Abs) 0.0 0.0 - 0.1 x10E3/uL  Lipid Panel w/o Chol/HDL Ratio  Result Value Ref Range   Cholesterol, Total 173 100 - 199 mg/dL   Triglycerides 64 0 - 149 mg/dL   HDL 74 >39 mg/dL   VLDL Cholesterol Cal 12 5 - 40 mg/dL   LDL Chol  Calc (NIH) 87 0 -  99 mg/dL  PSA  Result Value Ref Range   Prostate Specific Ag, Serum 2.9 0.0 - 4.0 ng/mL  TSH  Result Value Ref Range   TSH 2.850 0.450 - 4.500 uIU/mL  Urinalysis, Routine w reflex microscopic  Result Value Ref Range   Specific Gravity, UA 1.025 1.005 - 1.030   pH, UA 6.0 5.0 - 7.5   Color, UA Yellow Yellow   Appearance Ur Clear Clear   Leukocytes,UA Negative Negative   Protein,UA Negative Negative/Trace   Glucose, UA Negative Negative   Ketones, UA Negative Negative   RBC, UA Negative Negative   Bilirubin, UA Negative Negative   Urobilinogen, Ur 0.2 0.2 - 1.0 mg/dL   Nitrite, UA Negative Negative  Microalbumin, Urine Waived  Result Value Ref Range   Microalb, Ur Waived 10 0 - 19 mg/L   Creatinine, Urine Waived 200 10 - 300 mg/dL   Microalb/Creat Ratio <30 <30 mg/g  [id number] 11+Oxyco+Alc+Crt-Bund  Result Value Ref Range   Ethanol Negative Cutoff=0.020 %   Amphetamines, Urine Negative Cutoff=1000 ng/mL   Barbiturate Negative Cutoff=200 ng/mL   BENZODIAZ UR QL Negative Cutoff=200 ng/mL   Cannabinoid Quant, Ur Negative Cutoff=50 ng/mL   Cocaine (Metabolite) Negative Cutoff=300 ng/mL   OPIATE SCREEN URINE Negative Cutoff=300 ng/mL   Oxycodone/Oxymorphone, Urine Negative Cutoff=300 ng/mL   Phencyclidine Negative Cutoff=25 ng/mL   Methadone Screen, Urine Negative Cutoff=300 ng/mL   Propoxyphene Negative Cutoff=300 ng/mL   Meperidine Negative Cutoff=200 ng/mL   Tramadol Negative Cutoff=200 ng/mL   Creatinine 148.0 20.0 - 300.0 mg/dL   pH, Urine 5.9 4.5 - 8.9      Assessment & Plan:   Problem List Items Addressed This Visit       Other   Depression, major, single episode, moderate ([place])    Not doing well. Does not want preventative medicine. Would like to go up on his clonazepam as the 0.24m does not seem to help as much. Discussed that we do not prescribe daily clonazepam without a preventative medicine and that I would not go up to 1100mwithout him being on a preventative  medicine consistently. Will give a refill on his clonazepam for now. 45 pills must last at least 4-6 weeks with goal of lasting longer. Recheck 4-6 weeks. Call with any concerns.         Follow up plan: Return 4-6 weeks.    This visit was completed via video visit through MyChart due to the restrictions of the COVID-19 pandemic. All issues as above were discussed and addressed. Physical exam was done as above through visual confirmation on video through MyChart. If it was felt that the patient should be evaluated in the office, they were directed there. The patient verbally consented to this visit. Location of the patient: home Location of the provider: work Those involved with this call:  Provider: MePark LiterDO CMA: CrLouanna RawCMChehalisesk/Registration: AlFirstEnergy CorpTime spent on call:  15 minutes with patient face to face via video conference. More than 50% of this time was spent in counseling and coordination of care. 23 minutes total spent in review of patient's record and preparation of their chart.

## 2021-08-18 ENCOUNTER — Telehealth: Payer: Self-pay | Admitting: Family Medicine

## 2021-09-03 ENCOUNTER — Other Ambulatory Visit: Payer: Self-pay

## 2021-09-03 ENCOUNTER — Other Ambulatory Visit: Payer: BC Managed Care – PPO

## 2021-09-03 ENCOUNTER — Encounter: Payer: Self-pay | Admitting: Family Medicine

## 2021-09-03 ENCOUNTER — Other Ambulatory Visit: Payer: Self-pay | Admitting: Family Medicine

## 2021-09-03 ENCOUNTER — Ambulatory Visit (INDEPENDENT_AMBULATORY_CARE_PROVIDER_SITE_OTHER): Payer: 59 | Admitting: Family Medicine

## 2021-09-03 VITALS — BP 121/80 | HR 75 | Temp 98.0°F | Wt 145.0 lb

## 2021-09-03 DIAGNOSIS — Z79899 Other long term (current) drug therapy: Secondary | ICD-10-CM

## 2021-09-03 DIAGNOSIS — R8281 Pyuria: Secondary | ICD-10-CM | POA: Diagnosis not present

## 2021-09-03 DIAGNOSIS — F321 Major depressive disorder, single episode, moderate: Secondary | ICD-10-CM | POA: Diagnosis not present

## 2021-09-03 DIAGNOSIS — R300 Dysuria: Secondary | ICD-10-CM

## 2021-09-03 DIAGNOSIS — Z113 Encounter for screening for infections with a predominantly sexual mode of transmission: Secondary | ICD-10-CM

## 2021-09-03 DIAGNOSIS — E782 Mixed hyperlipidemia: Secondary | ICD-10-CM

## 2021-09-03 DIAGNOSIS — G4700 Insomnia, unspecified: Secondary | ICD-10-CM | POA: Diagnosis not present

## 2021-09-03 DIAGNOSIS — I10 Essential (primary) hypertension: Secondary | ICD-10-CM

## 2021-09-03 DIAGNOSIS — R3914 Feeling of incomplete bladder emptying: Secondary | ICD-10-CM

## 2021-09-03 DIAGNOSIS — N401 Enlarged prostate with lower urinary tract symptoms: Secondary | ICD-10-CM | POA: Diagnosis not present

## 2021-09-03 LAB — MICROALBUMIN, URINE WAIVED
Creatinine, Urine Waived: 200 mg/dL (ref 10–300)
Microalb, Ur Waived: 30 mg/L — ABNORMAL HIGH (ref 0–19)
Microalb/Creat Ratio: <30 mg/g (ref <30)

## 2021-09-03 LAB — URINALYSIS, ROUTINE W REFLEX MICROSCOPIC
Bilirubin, UA: NEGATIVE
Glucose, UA: NEGATIVE
Ketones, UA: NEGATIVE
Nitrite, UA: NEGATIVE
RBC, UA: NEGATIVE
Specific Gravity, UA: 1.025 (ref 1.005–1.030)
Urobilinogen, Ur: 0.2 mg/dL (ref 0.2–1.0)
pH, UA: 6 (ref 5.0–7.5)

## 2021-09-03 LAB — MICROSCOPIC EXAMINATION
Bacteria, UA: NONE SEEN
Epithelial Cells (non renal): NONE SEEN /hpf (ref 0–10)
RBC, Urine: NONE SEEN /hpf (ref 0–2)

## 2021-09-03 MED ORDER — ESZOPICLONE 3 MG PO TABS: ORAL_TABLET | ORAL | 2 refills | Status: DC

## 2021-09-03 MED ORDER — HYDROXYZINE PAMOATE 25 MG PO CAPS: 25.0000 mg | ORAL_CAPSULE | Freq: Three times a day (TID) | ORAL | 3 refills | Status: DC | PRN

## 2021-09-03 MED ORDER — DUTASTERIDE 0.5 MG PO CAPS: 0.5000 mg | ORAL_CAPSULE | Freq: Every day | ORAL | 3 refills | Status: DC

## 2021-09-03 NOTE — Progress Notes (Signed)
? ?BP 121/80   Pulse 75   Temp 98 ?F (36.7 ?C)   Wt 145 lb (65.8 kg)   SpO2 98%   BMI 23.76 kg/m?   ? ?Subjective:  ? ? Patient ID: Shawn Ware, male    DOB: Mar 06, 1971, 51 y.o.   MRN: [id number] ? ?HPI: ?Shawn Ware is a 51 y.o. male ? ?Chief Complaint  ?Patient presents with  ? Depression  ?  Follow up   ? Urinary Frequency  ?  Patient states he feels like he has to go to the bathroom but does not feel like he can't empty. Patient has noticed his urine stream is not as strong.   ? ?ANXIETY/DEPRESSION ?Duration: chronic ?Status:stable ?Anxious mood: yes  ?Excessive worrying: no ?Irritability: no  ?Sweating: no ?Nausea: no ?Palpitations:no ?Hyperventilation: no ?Panic attacks: no ?Agoraphobia: no  ?Obscessions/compulsions: no ?Depressed mood: yes ?Depression screen [facility] 2/9 07/06/2021 05/07/2021 01/22/2021 11/04/2020 05/25/2020  ?Decreased Interest 2 0 1 1 0  ?Down, Depressed, Hopeless 2 0 1 1 0  ?PHQ - 2 Score 4 0 2 2 0  ?Altered sleeping 0 2 3 0 1  ?Tired, decreased energy 1 1 2 3 1   ?Change in appetite 0 0 1 2 0  ?Feeling bad or failure about yourself  1 0 2 0 1  ?Trouble concentrating 1 0 2 1 0  ?Moving slowly or fidgety/restless 0 0 1 1 1   ?Suicidal thoughts 0 0 0 0 0  ?PHQ-9 Score 7 3 13 9 4   ?Difficult doing work/chores Somewhat difficult - - Somewhat difficult Somewhat difficult  ?Some recent data might be hidden  ? ?Anhedonia: no ?Weight changes: no ?Insomnia: no   ?Hypersomnia: no ?Fatigue/loss of energy: yes ?Feelings of worthlessness: yes ?Feelings of guilt: no ?Impaired concentration/indecisiveness: no ?Suicidal ideations: no  ?Crying spells: yes ?Recent Stressors/Life Changes: no ?  Relationship problems: no ?  Family stress: no   ?  Financial stress: no  ?  Job stress: no  ?  Recent death/loss: no ? ? ?URINARY SYMPTOMS ?Duration: 3 weeks ?Dysuria: no ?Urinary frequency: yes ?Urgency: yes ?Small volume voids: yes ?Symptom severity: moderate ?Urinary incontinence: no ?Foul odor: no ?Hematuria:  no ?Abdominal pain: no ?Back pain: no ?Suprapubic pain/pressure: yes ?Flank pain: no ?Fever:  no ?Vomiting: no ?Relief with cranberry juice: no ?Relief with pyridium: no ?Status:worse ?Previous urinary tract infection: no ?Recurrent urinary tract infection: no ?History of sexually transmitted disease: no ?Penile discharge: no ?Treatments attempted: increasing fluids  ? ?BPH ?BPH status:  new onset ?Satisfied with current treatment?: no ?Medication side effects: no ?Medication compliance: not on anything ?Duration: about a month ?Nocturia: 1/night ?Urinary frequency:no ?Incomplete voiding: yes ?Urgency: yes ?Weak urinary stream: yes ?Straining to start stream: yes ?Dysuria: yes ?Onset: sudden ?Severity: moderate ? ?Relevant past medical, surgical, family and social history reviewed and updated as indicated. Interim medical history since our last visit reviewed. ?Allergies and medications reviewed and updated. ? ?Review of Systems  ?Constitutional: Negative.   ?Respiratory: Negative.    ?Cardiovascular: Negative.   ?Gastrointestinal: Negative.   ?Genitourinary:  Positive for decreased urine volume and dysuria. Negative for difficulty urinating, enuresis, flank pain, frequency, genital sores, hematuria, penile discharge, penile pain, penile swelling, scrotal swelling, testicular pain and urgency.  ?Musculoskeletal: Negative.   ?Neurological: Negative.   ?Psychiatric/Behavioral: Negative.    ? ?Per HPI unless specifically indicated above ? ?   ?Objective:  ?  ?BP 121/80   Pulse 75   Temp 98 ?F (36.7 ?C)  Wt 145 lb (65.8 kg)   SpO2 98%   BMI 23.76 kg/m?   ?Wt Readings from Last 3 Encounters:  ?09/03/21 145 lb (65.8 kg)  ?05/06/21 144 lb 3.2 oz (65.4 kg)  ?01/22/21 145 lb (65.8 kg)  ?  ?Physical Exam ?Vitals and nursing note reviewed.  ?Constitutional:   ?   General: He is not in acute distress. ?   Appearance: Normal appearance. He is not ill-appearing, toxic-appearing or diaphoretic.  ?HENT:  ?   Head:  Normocephalic and atraumatic.  ?   Right Ear: External ear normal.  ?   Left Ear: External ear normal.  ?   Nose: Nose normal.  ?   Mouth/Throat:  ?   Mouth: Mucous membranes are moist.  ?   Pharynx: Oropharynx is clear.  ?Eyes:  ?   General: No scleral icterus.    ?   Right eye: No discharge.     ?   Left eye: No discharge.  ?   Extraocular Movements: Extraocular movements intact.  ?   Conjunctiva/sclera: Conjunctivae normal.  ?   Pupils: Pupils are equal, round, and reactive to light.  ?Cardiovascular:  ?   Rate and Rhythm: Normal rate and regular rhythm.  ?   Pulses: Normal pulses.  ?   Heart sounds: Normal heart sounds. No murmur heard. ?  No friction rub. No gallop.  ?Pulmonary:  ?   Effort: Pulmonary effort is normal. No respiratory distress.  ?   Breath sounds: Normal breath sounds. No stridor. No wheezing, rhonchi or rales.  ?Chest:  ?   Chest wall: No tenderness.  ?Musculoskeletal:     ?   General: Normal range of motion.  ?   Cervical back: Normal range of motion and neck supple.  ?Skin: ?   General: Skin is warm and dry.  ?   Capillary Refill: Capillary refill takes less than 2 seconds.  ?   Coloration: Skin is not jaundiced or pale.  ?   Findings: No bruising, erythema, lesion or rash.  ?Neurological:  ?   General: No focal deficit present.  ?   Mental Status: He is alert and oriented to person, place, and time. Mental status is at baseline.  ?Psychiatric:     ?   Mood and Affect: Mood normal.     ?   Behavior: Behavior normal.     ?   Thought Content: Thought content normal.     ?   Judgment: Judgment normal.  ? ? ?Results for orders placed or performed in visit on 09/03/21  ?Microscopic Examination  ? Urine  ?Result Value Ref Range  ? WBC, UA 0-5 0 - 5 /hpf  ? RBC None seen 0 - 2 /hpf  ? Epithelial Cells (non renal) None seen 0 - 10 /hpf  ? Mucus, UA Present (A) Not Estab.  ? Bacteria, UA None seen None seen/Few  ?Urinalysis, Routine w reflex microscopic  ?Result Value Ref Range  ? Specific Gravity, UA  1.025 1.005 - 1.030  ? pH, UA 6.0 5.0 - 7.5  ? Color, UA Yellow Yellow  ? Appearance Ur Clear Clear  ? Leukocytes,UA 1+ (A) Negative  ? Protein,UA Trace (A) Negative/Trace  ? Glucose, UA Negative Negative  ? Ketones, UA Negative Negative  ? RBC, UA Negative Negative  ? Bilirubin, UA Negative Negative  ? Urobilinogen, Ur 0.2 0.2 - 1.0 mg/dL  ? Nitrite, UA Negative Negative  ? Microscopic Examination See below:   ?Microalbumin, Urine  Waived  ?Result Value Ref Range  ? Microalb, Ur Waived 30 (H) 0 - 19 mg/L  ? Creatinine, Urine Waived 200 10 - 300 mg/dL  ? Microalb/Creat Ratio <30 <30 mg/g  ? ?   ?Assessment & Plan:  ? ?Problem List Items Addressed This Visit   ? ?  ? Genitourinary  ? Benign prostatic hyperplasia with incomplete bladder emptying - Primary  ?  Has tried flomax with decreased semen production before. Will start avodart. Call with any concerns. Recheck 3 months.  ?  ?  ? Relevant Medications  ? dutasteride (AVODART) 0.5 MG capsule  ?  ? Other  ? Depression, major, single episode, moderate ([place])  ?  Stable. Refuses preventative. Continue PRN klonopin and will start hydroxyzine as needed. Call with any concerns. Recheck 3 months.  ?  ?  ? Relevant Medications  ? hydrOXYzine (VISTARIL) 25 MG capsule  ? Insomnia  ?  Under good control on current regimen. Continue current regimen. Continue to monitor. Call with any concerns. Refills given.  ? ?  ?  ? ?Other Visit Diagnoses   ? ? Pyuria      ? Will send for culture and GC/Chlamydia. Await results.   ? Relevant Orders  ? Urine Culture  ? ?  ?  ? ?Follow up plan: ?Return in about 3 months (around 12/04/2021). ? ? ? ? ? ?

## 2021-09-03 NOTE — Assessment & Plan Note (Signed)
Under good control on current regimen. Continue current regimen. Continue to monitor. Call with any concerns. Refills given.   

## 2021-09-03 NOTE — Progress Notes (Unsigned)
labs

## 2021-09-03 NOTE — Assessment & Plan Note (Signed)
Has tried flomax with decreased semen production before. Will start avodart. Call with any concerns. Recheck 3 months.  ?

## 2021-09-03 NOTE — Assessment & Plan Note (Signed)
Stable. Refuses preventative. Continue PRN klonopin and will start hydroxyzine as needed. Call with any concerns. Recheck 3 months.  ?

## 2021-09-05 LAB — URINE CULTURE

## 2021-09-06 LAB — GC/CHLAMYDIA PROBE AMP
Chlamydia trachomatis, NAA: NEGATIVE
Neisseria Gonorrhoeae by PCR: NEGATIVE

## 2021-09-08 ENCOUNTER — Ambulatory Visit: Payer: Self-pay | Admitting: *Deleted

## 2021-09-08 NOTE — Telephone Encounter (Signed)
Appt scheduled tomorrow with Dr. Charlotta Newton ?

## 2021-09-08 NOTE — Telephone Encounter (Signed)
Routing to provider to advise. See message below from triage.  ?

## 2021-09-08 NOTE — Telephone Encounter (Signed)
Pt's license states that he was born in 1972, we have to keep the birth date as is.  ?

## 2021-09-08 NOTE — Telephone Encounter (Signed)
?  Chief Complaint: Medication request: sinus infection ?Symptoms: runny nose, sinus drainage, ear pressure, sneezing ?Frequency: started Thursday ?Pertinent Negatives: Patient denies fever, pain ?Disposition: [] ED /[] Urgent Care (no appt availability in office) / [] Appointment(In office/virtual)/ []  [place] [facility]/ [] Home Care/ [x] Refused Recommended Disposition /[]  Mobile Bus/ []  Follow-up with PCP ?Additional Notes: Patient states he was seen in office Friday with beginnings of seasonal allergies- he states he has gotten worse and is requesting antibiotic. Patient advised of office policy- but request message to provider. Patient states he has allergies and when he has a flare- provider treats with antibiotic- patient feels he need this now. Patient uses [facility]. Please call and let him know if medication can be prescribed. ?

## 2021-09-08 NOTE — Telephone Encounter (Signed)
Pt just seen Friday, but forgot to discuss his possible sinus infection. He was just getting it then, but now it is really bothering him. Pt has congestion, runny nose, sneezing, he says allergy related.  Would like to know, since he was just in on Friday, if the dr will call in abx?  I advised pt to make appointment, but he declined. Please advise.  ?Reason for Disposition ? Earache ? ?Answer Assessment - Initial Assessment Questions ?1. LOCATION: "Where does it hurt?"  ?    No pain just sinus drainage- patient states it started after appointment- he got worse with mowing. ?2. ONSET: "When did the sinus pain start?"  (e.g., hours, days)  ?    Thursday of last week- getting worse ?3. SEVERITY: "How bad is the pain?"   (Scale 1-10; mild, moderate or severe) ?  - MILD (1-3): doesn't interfere with normal activities  ?  - MODERATE (4-7): interferes with normal activities (e.g., work or school) or awakens from sleep ?  - SEVERE (8-10): excruciating pain and patient unable to do any normal activities    ?    No pain- mild- mostly drainage ?4. RECURRENT SYMPTOM: "Have you ever had sinus problems before?" If Yes, ask: "When was the last time?" and "What happened that time?"  ?    Yes- usually take antibiotic ?5. NASAL CONGESTION: "Is the nose blocked?" If Yes, ask: "Can you open it or must you breathe through your mouth?" ?    No blockage- just drainage ?6. NASAL DISCHARGE: "Do you have discharge from your nose?" If so ask, "What color?" ?    yellow ?7. FEVER: "Do you have a fever?" If Yes, ask: "What is it, how was it measured, and when did it start?"  ?    no ?8. OTHER SYMPTOMS: "Do you have any other symptoms?" (e.g., sore throat, cough, earache, difficulty breathing) ?    Ears congested ?9. PREGNANCY: "Is there any chance you are pregnant?" "When was your last menstrual period?" ? ?Protocols used: Sinus Pain or Congestion-A-AH ? ?

## 2021-09-09 ENCOUNTER — Encounter: Payer: Self-pay | Admitting: Internal Medicine

## 2021-09-09 ENCOUNTER — Telehealth (INDEPENDENT_AMBULATORY_CARE_PROVIDER_SITE_OTHER): Payer: 59 | Admitting: Internal Medicine

## 2021-09-09 DIAGNOSIS — J069 Acute upper respiratory infection, unspecified: Secondary | ICD-10-CM | POA: Diagnosis not present

## 2021-09-09 MED ORDER — FLUTICASONE PROPIONATE 50 MCG/ACT NA SUSP: 2.0000 | Freq: Every day | NASAL | 6 refills | Status: DC

## 2021-09-09 MED ORDER — FEXOFENADINE HCL 180 MG PO TABS: 180.0000 mg | ORAL_TABLET | Freq: Every day | ORAL | 1 refills | Status: DC

## 2021-09-09 NOTE — Progress Notes (Signed)
? ?There were no vitals taken for this visit.  ? ?Subjective:  ? ? Patient ID: Shawn Ware, male    DOB: 1970/07/29, 51 y.o.   MRN: [id number] ? ?Chief Complaint  ?Patient presents with  ? head congestion  ?  Started on Friday last week  ? ? ?HPI: ?Shawn Ware is a 51 y.o. male ? ? ?This visit was completed via video visit through FaceTime due to the restrictions of the COVID-19 pandemic. All issues as above were discussed and addressed. Physical exam was done as above through visual confirmation on video through MyChart. If it was felt that the patient should be evaluated in the office, they were directed there. The patient verbally consented to this visit. ?Location of the patient: home ?Location of the provider: work ?Those involved with this call:  ?Provider: Charlynne Cousins, MD ?CMA: Frazier Butt, CMA ?Front Desk/Registration: [employer]  ?Time spent on call: 10 minutes with patient face to face via video conference. More than 50% of this time was spent in counseling and coordination of care. 10 minutes total spent in review of patient's record and preparation of their chart. ? ?Was here last week  ?Has had ear congestion and full, feels congested.  ?Home COVID  ? ? ? ? ?Cough ?This is a new (white phlegm prodcutive) problem. The cough is Productive of sputum. Associated symptoms include ear congestion. Pertinent negatives include no chest pain, chills, ear pain, fever, headaches, heartburn, hemoptysis, myalgias, nasal congestion, postnasal drip, rash, rhinorrhea, sore throat, shortness of breath, sweats, weight loss or wheezing.  ? ?Chief Complaint  ?Patient presents with  ? head congestion  ?  Started on Friday last week  ? ? ?Relevant past medical, surgical, family and social history reviewed and updated as indicated. Interim medical history since our last visit reviewed. ?Allergies and medications reviewed and updated. ? ?Review of Systems  ?Constitutional:  Negative for chills, fever and weight loss.   ?HENT:  Negative for ear pain, postnasal drip, rhinorrhea and sore throat.   ?Respiratory:  Positive for cough. Negative for hemoptysis, shortness of breath and wheezing.   ?Cardiovascular:  Negative for chest pain.  ?Gastrointestinal:  Negative for heartburn.  ?Musculoskeletal:  Negative for myalgias.  ?Skin:  Negative for rash.  ?Neurological:  Negative for headaches.  ? ?Per HPI unless specifically indicated above ? ?   ?Objective:  ?  ?There were no vitals taken for this visit.  ?Wt Readings from Last 3 Encounters:  ?09/03/21 145 lb (65.8 kg)  ?05/06/21 144 lb 3.2 oz (65.4 kg)  ?01/22/21 145 lb (65.8 kg)  ?  ?Physical Exam ? ?Results for orders placed or performed in visit on 09/03/21  ?Urine Culture  ? Specimen: Urine  ? UR  ?Result Value Ref Range  ? Urine Culture, Routine Final report   ? Organism ID, Bacteria Comment   ? ?   ? ? ?Current Outpatient Medications:  ?  amLODipine (NORVASC) 10 MG tablet, Take 1 tablet (10 mg total) by mouth daily., Disp: 30 tablet, Rfl: 1 ?  atorvastatin (LIPITOR) 40 MG tablet, Take 1 tablet (40 mg total) by mouth daily., Disp: 90 tablet, Rfl: 1 ?  clonazePAM (KLONOPIN) 0.5 MG tablet, TAKE 1 TABLET BY MOUTH ONCE DAILY AS NEEDED ANXIETY, Disp: 45 tablet, Rfl: 0 ?  dutasteride (AVODART) 0.5 MG capsule, Take 1 capsule (0.5 mg total) by mouth daily., Disp: 30 capsule, Rfl: 3 ?  Eszopiclone 3 MG TABS, TAKE 1 TABLET BY MOUTH AT BEDTIME AS  NEEDED. TAKE IMMEDIATELTY BEFORE BEDTIME, Disp: 30 tablet, Rfl: 2 ?  fexofenadine (ALLEGRA ALLERGY) 180 MG tablet, Take 1 tablet (180 mg total) by mouth daily., Disp: 10 tablet, Rfl: 1 ?  fluticasone (FLONASE) 50 MCG/ACT nasal spray, Place 2 sprays into both nostrils daily., Disp: 16 g, Rfl: 6 ?  fluticasone (FLONASE) 50 MCG/ACT nasal spray, Place 2 sprays into both nostrils daily., Disp: 16 g, Rfl: 6 ?  hydrOXYzine (VISTARIL) 25 MG capsule, Take 1 capsule (25 mg total) by mouth every 8 (eight) hours as needed., Disp: 90 capsule, Rfl: 3   ? ? ?Assessment & Plan:  ?URI check strep/ flu tests today  ?Increase fluid intake.  ?Headahce - tyelnol every 4-6 hrs prn and alternate this with ibubrufen 800 mg q 8 hrly. ?Sinus pressure: use steam inhalation.  ?Start using flonase/  Allegra  ?Pt will need to come in wait in the car and get swabbed for flu and strept  today  ?Pt verbalized understanding of such, to get to the office at today and get a curb side test for the above. ? ?Problem List Items Addressed This Visit   ? ?  ? Respiratory  ? Upper respiratory tract infection - Primary  ? Relevant Orders  ? Veritor Flu A/B Waived  ? Rapid Strep screen([facility])  ?  ? ?Orders Placed This Encounter  ?Procedures  ? Rapid Strep screen([facility])  ? Veritor Flu A/B Waived  ?  ? ?Meds ordered this encounter  ?Medications  ? fexofenadine (ALLEGRA ALLERGY) 180 MG tablet  ?  Sig: Take 1 tablet (180 mg total) by mouth daily.  ?  Dispense:  10 tablet  ?  Refill:  1  ? fluticasone (FLONASE) 50 MCG/ACT nasal spray  ?  Sig: Place 2 sprays into both nostrils daily.  ?  Dispense:  16 g  ?  Refill:  6  ?  ? ?Follow up plan: ?No follow-ups on file. ? ?

## 2021-09-09 NOTE — Progress Notes (Signed)
Please let pt know this was normal.

## 2021-09-10 ENCOUNTER — Telehealth: Payer: Self-pay

## 2021-09-10 ENCOUNTER — Telehealth: Payer: Self-pay | Admitting: Family Medicine

## 2021-09-10 ENCOUNTER — Other Ambulatory Visit: Payer: Self-pay

## 2021-09-10 MED ORDER — AZITHROMYCIN 250 MG PO TABS: ORAL_TABLET | ORAL | 0 refills | Status: AC

## 2021-09-10 NOTE — Progress Notes (Signed)
Zpak - pt allergic to penicillins, sent in to pharmacy for possible sinusitis since al swabs -ve ?Please let pt know thnx.

## 2021-09-10 NOTE — Addendum Note (Signed)
Addended byCharlynne Cousins on: 09/10/2021 09:27 AM ? ? Modules accepted: Orders ? ?

## 2021-09-10 NOTE — Telephone Encounter (Signed)
Initiated a refill request for requested medication and sent to PCP  ?

## 2021-09-10 NOTE — Telephone Encounter (Signed)
Pt is calling upset reporting that he still has congestion and no antibiotic was sent in. Pt states why are all these labs being done with no medication being sent in. ?Pt would like to know does he have a sinus infection?  ? ?Pt would also like to let Dr. Wynetta Emery know that the hydrOXYzine (VISTARIL) 25 MG capsule [id number] is not working and he would like to return to clonazePAM (KLONOPIN) 0.5 MG tablet [id number] . Pt reports that he has 5-6 pills left of the clonazepam.  ? ?Preferred pharmacy- [facility]. ?CB- [phone] ?

## 2021-09-10 NOTE — Telephone Encounter (Signed)
He will have to speak with his pcp about this thnx

## 2021-09-10 NOTE — Telephone Encounter (Signed)
Patient was called and given results from Strep and flu swaps, both were neg, however patient states that he would like to have a refill of his Clonazepam since the hydroxyzine does nothing for hi.  ?

## 2021-09-12 LAB — RAPID STREP SCREEN (MED CTR MEBANE ONLY): Strep Gp A Ag, IA W/Reflex: NEGATIVE

## 2021-09-12 LAB — VERITOR FLU A/B WAIVED
Influenza A: NEGATIVE
Influenza B: NEGATIVE

## 2021-09-12 LAB — CULTURE, GROUP A STREP: Strep A Culture: NEGATIVE

## 2021-10-04 ENCOUNTER — Ambulatory Visit (INDEPENDENT_AMBULATORY_CARE_PROVIDER_SITE_OTHER): Payer: BC Managed Care – PPO | Admitting: Family Medicine

## 2021-10-04 ENCOUNTER — Encounter: Payer: Self-pay | Admitting: Family Medicine

## 2021-10-04 VITALS — BP 109/73 | HR 73 | Temp 98.5°F | Wt 150.6 lb

## 2021-10-04 DIAGNOSIS — H6983 Other specified disorders of Eustachian tube, bilateral: Secondary | ICD-10-CM

## 2021-10-04 DIAGNOSIS — M79602 Pain in left arm: Secondary | ICD-10-CM | POA: Diagnosis not present

## 2021-10-04 DIAGNOSIS — F321 Major depressive disorder, single episode, moderate: Secondary | ICD-10-CM

## 2021-10-04 MED ORDER — PREDNISONE 50 MG PO TABS: 50.0000 mg | ORAL_TABLET | Freq: Every day | ORAL | 0 refills | Status: DC

## 2021-10-04 MED ORDER — CLONAZEPAM 0.5 MG PO TABS: ORAL_TABLET | ORAL | 0 refills | Status: DC

## 2021-10-04 MED ORDER — BUSPIRONE HCL 5 MG PO TABS: 5.0000 mg | ORAL_TABLET | Freq: Three times a day (TID) | ORAL | 2 refills | Status: DC

## 2021-10-04 NOTE — Progress Notes (Signed)
? ?BP 109/73   Pulse 73   Temp 98.5 ?F (36.9 ?C)   Wt 150 lb 9.6 oz (68.3 kg)   SpO2 97%   BMI 24.68 kg/m?   ? ?Subjective:  ? ? Patient ID: Shawn Ware, male    DOB: 28-Aug-1970, 51 y.o.   MRN: [id number] ? ?HPI: ?Shawn Ware is a 51 y.o. male ? ?Chief Complaint  ?Patient presents with  ? Ear Fullness  ?  Patient states he has ear congestion for a month, no other symptoms  ? Anxiety  ?  Patient requesting refill on Clonazepam today   ? Arm Pain  ?  Patient states he believes he has a pinched nerve in his left arm, pain starts in wrist and radiates to back   ? ?EAG CLOGGED ?Duration: about a month ?Involved ear(s):  bilateral ?Sensation of feeling clogged/plugged: yes ?Decreased/muffled hearing:yes ?Ear pain: no ?Fever: no ?Otorrhea: no ?Hearing loss: yes ?Upper respiratory infection symptoms: no ?Using Q-Tips: no ?Status: stable ?History of cerumenosis: no ?Treatments attempted: pseudoephedrine ? ?ARM PAIN ?Duration: about a month ?Location: whole arm ?Mechanism of injury:  lifting ?Onset: gradual ?Severity: moderate  ?Quality:  shooting pain ?Frequency: intermittent ?Radiation: yes ?Aggravating factors: certain positions  ?Alleviating factors: standing   ?Status: worse ?Treatments attempted: ibuprofen  ?Relief with NSAIDs?:  mild ?Swelling: no ?Redness: no  ?Warmth: no ?Trauma: no ?Chest pain: no  ?Shortness of breath: no  ?Fever: no ?Decreased sensation: no ?Paresthesias: no ?Weakness: no ? ?ANXIETY/DEPRESSION- did not do well with the hydroxyzine, still taking it about 2-3 days ?Duration: chronic ?Status:stable ?Anxious mood: yes  ?Excessive worrying: yes ?Irritability: no  ?Sweating: no ?Nausea: no ?Palpitations:no ?Hyperventilation: no ?Panic attacks: no ?Agoraphobia: no  ?Obscessions/compulsions: no ?Depressed mood: yes ? ?  09/09/2021  ? 11:27 AM 07/06/2021  ?  4:32 PM 05/07/2021  ? 10:00 AM 01/22/2021  ? 10:53 AM 11/04/2020  ? 10:17 AM  ?Depression screen PHQ 2/9  ?Decreased Interest 3 2 0 1 1  ?Down,  Depressed, Hopeless 3 2 0 1 1  ?PHQ - 2 Score 6 4 0 2 2  ?Altered sleeping 3 0 2 3 0  ?Tired, decreased energy 1 1 1 2 3   ?Change in appetite 3 0 0 1 2  ?Feeling bad or failure about yourself  1 1 0 2 0  ?Trouble concentrating 3 1 0 2 1  ?Moving slowly or fidgety/restless 2 0 0 1 1  ?Suicidal thoughts 0 0 0 0 0  ?PHQ-9 Score 19 7 3 13 9   ?Difficult doing work/chores Somewhat difficult Somewhat difficult   Somewhat difficult  ? ?Anhedonia: no ?Weight changes: no ?Insomnia: yes   ?Hypersomnia: no ?Fatigue/loss of energy: yes ?Feelings of worthlessness: no ?Feelings of guilt: no ?Impaired concentration/indecisiveness: yes ?Suicidal ideations: no  ?Crying spells: no ?Recent Stressors/Life Changes: no ?  Relationship problems: no ?  Family stress: no   ?  Financial stress: no  ?  Job stress: no  ?  Recent death/loss: no ? ?Relevant past medical, surgical, family and social history reviewed and updated as indicated. Interim medical history since our last visit reviewed. ?Allergies and medications reviewed and updated. ? ?Review of Systems  ?Constitutional: Negative.   ?HENT: Negative.    ?Eyes: Negative.   ?Respiratory: Negative.    ?Cardiovascular: Negative.   ?Musculoskeletal: Negative.   ?Skin: Negative.   ?Psychiatric/Behavioral:  Positive for dysphoric mood. Negative for agitation, behavioral problems, confusion, decreased concentration, hallucinations, self-injury, sleep disturbance and suicidal ideas. The  patient is nervous/anxious. The patient is not hyperactive.   ? ?Per HPI unless specifically indicated above ? ?   ?Objective:  ?  ?BP 109/73   Pulse 73   Temp 98.5 ?F (36.9 ?C)   Wt 150 lb 9.6 oz (68.3 kg)   SpO2 97%   BMI 24.68 kg/m?   ?Wt Readings from Last 3 Encounters:  ?10/04/21 150 lb 9.6 oz (68.3 kg)  ?09/03/21 145 lb (65.8 kg)  ?05/06/21 144 lb 3.2 oz (65.4 kg)  ?  ?Physical Exam ?Vitals and nursing note reviewed.  ?Constitutional:   ?   General: He is not in acute distress. ?   Appearance: Normal  appearance. He is normal weight. He is not ill-appearing, toxic-appearing or diaphoretic.  ?HENT:  ?   Head: Normocephalic and atraumatic.  ?   Right Ear: Tympanic membrane, ear canal and external ear normal.  ?   Left Ear: Tympanic membrane, ear canal and external ear normal.  ?   Nose: Nose normal.  ?   Mouth/Throat:  ?   Mouth: Mucous membranes are moist.  ?   Pharynx: Oropharynx is clear.  ?Eyes:  ?   General: No scleral icterus.    ?   Right eye: No discharge.     ?   Left eye: No discharge.  ?   Extraocular Movements: Extraocular movements intact.  ?   Conjunctiva/sclera: Conjunctivae normal.  ?   Pupils: Pupils are equal, round, and reactive to light.  ?Cardiovascular:  ?   Rate and Rhythm: Normal rate and regular rhythm.  ?   Pulses: Normal pulses.  ?   Heart sounds: Normal heart sounds. No murmur heard. ?  No friction rub. No gallop.  ?Pulmonary:  ?   Effort: Pulmonary effort is normal. No respiratory distress.  ?   Breath sounds: Normal breath sounds. No stridor. No wheezing, rhonchi or rales.  ?Chest:  ?   Chest wall: No tenderness.  ?Musculoskeletal:     ?   General: Normal range of motion.  ?   Cervical back: Normal range of motion and neck supple.  ?Skin: ?   General: Skin is warm and dry.  ?   Capillary Refill: Capillary refill takes less than 2 seconds.  ?   Coloration: Skin is not jaundiced or pale.  ?   Findings: No bruising, erythema, lesion or rash.  ?Neurological:  ?   General: No focal deficit present.  ?   Mental Status: He is alert and oriented to person, place, and time. Mental status is at baseline.  ?Psychiatric:     ?   Mood and Affect: Mood normal.     ?   Behavior: Behavior normal.     ?   Thought Content: Thought content normal.     ?   Judgment: Judgment normal.  ? ? ?Results for orders placed or performed in visit on 09/09/21  ?Rapid Strep screen([facility])  ? Specimen: Other  ? Other  ?Result Value Ref Range  ? Strep Gp A Ag, IA W/Reflex Negative Negative  ?Culture, Group A  Strep  ? Other  ?Result Value Ref Range  ? Strep A Culture Negative   ?Veritor Flu A/B Waived  ?Result Value Ref Range  ? Influenza A Negative Negative  ? Influenza B Negative Negative  ? ?   ?Assessment & Plan:  ? ?Problem List Items Addressed This Visit   ? ?  ? Nervous and Auditory  ? Eustachian tube dysfunction, bilateral  ?  Will treat with steroids. Call if not getting better and we'll send doxy given chronicity. Call with any concerns. ?  ?  ?  ? Other  ? Depression, major, single episode, moderate (HCC)  ?  Did not tolerate hydroxyzine. Will continue klonopin PRN (using 1/2 every 2-3 days) and start scheduled buspar. Call with any concerns. Follow up as scheduled in June. Continue to monitor.  ?  ?  ? Relevant Medications  ? busPIRone (BUSPAR) 5 MG tablet  ? ?Other Visit Diagnoses   ? ? Left arm pain    -  Primary  ? Concern for radiculopathy- will treat with prednisone and check x-rays. Await results.   ? Relevant Orders  ? DG Shoulder Left  ? DG Cervical Spine Complete  ? ?  ?  ? ?Follow up plan: ?No follow-ups on file. ? ? ? ? ? ?

## 2021-10-04 NOTE — Assessment & Plan Note (Signed)
Did not tolerate hydroxyzine. Will continue klonopin PRN (using 1/2 every 2-3 days) and start scheduled buspar. Call with any concerns. Follow up as scheduled in June. Continue to monitor.  ?

## 2021-10-04 NOTE — Assessment & Plan Note (Signed)
Will treat with steroids. Call if not getting better and we'll send doxy given chronicity. Call with any concerns. ?

## 2021-10-07 ENCOUNTER — Ambulatory Visit: Payer: BC Managed Care – PPO | Admitting: Family Medicine

## 2021-10-13 ENCOUNTER — Ambulatory Visit: Payer: Self-pay | Admitting: *Deleted

## 2021-10-13 ENCOUNTER — Other Ambulatory Visit: Payer: Self-pay | Admitting: Family Medicine

## 2021-10-13 MED ORDER — DOXYCYCLINE HYCLATE 100 MG PO TABS: 100.0000 mg | ORAL_TABLET | Freq: Two times a day (BID) | ORAL | 0 refills | Status: DC

## 2021-10-13 NOTE — Telephone Encounter (Signed)
?  Chief Complaint: ear congestion ?Symptoms: congested ear pain ?Frequency: constant ?Pertinent Negatives: Patient denies fever ?Disposition: [] ED /[] Urgent Care (no appt availability in office) / [] Appointment(In office/virtual)/ []  [place] [facility]/ [] Home Care/ [] Refused Recommended Disposition /[] [place] [facility]/ []  Follow-up with PCP ?Additional Notes: Pt already seen by Dr. Wynetta Emery who's OV note from 10/04/21 states if steroid does not clear it to call back for doxy. Will send message to Dr. Wynetta Emery. ? ?Reason for Disposition ? Ear congestion ? ?Answer Assessment - Initial Assessment Questions ?1. LOCATION: "Which ear is involved?"   ?    both ?2. SENSATION: "Describe how the ear feels." (e.g. stuffy, full, plugged)."  ?    congested ?3. ONSET:  "When did the ear symptoms start?"   ?    Started getting better 2 days ago but steroid finished and it is worse ?4. PAIN: "Do you also have an earache?" If Yes, ask: "How bad is it?" (Scale 1-10; or mild, moderate, severe) ?    mod ?5. CAUSE: "What do you think is causing the ear congestion?" ?    Have yearly ?6. URI: "Do you have a runny nose or cough?"  ?    no ?7. NASAL ALLERGIES: "Are there symptoms of hay fever, such as sneezing or a clear nasal discharge?" ?    yearly ?8. PREGNANCY: "Is there any chance you are pregnant?" "When was your last menstrual period?" ?    Na ? ?Protocols used: Ear - Congestion-A-AH ? ?

## 2021-10-13 NOTE — Telephone Encounter (Signed)
Rx sent to his pharmacy

## 2021-11-13 ENCOUNTER — Other Ambulatory Visit: Payer: Self-pay | Admitting: Family Medicine

## 2021-11-15 ENCOUNTER — Other Ambulatory Visit: Payer: Self-pay | Admitting: Family Medicine

## 2021-11-16 NOTE — Telephone Encounter (Signed)
Requested Prescriptions  Pending Prescriptions Disp Refills  . atorvastatin (LIPITOR) 40 MG tablet [Pharmacy Med Name: ATORVASTATIN CALCIUM 40 MG TAB] 90 tablet 1    Sig: TAKE 1 TABLET BY MOUTH ONCE EVERY EVENING     Cardiovascular:  Antilipid - Statins Failed - 11/13/2021  4:00 PM      Failed - Lipid Panel in normal range within the last 12 months    Cholesterol, Total  Date Value Ref Range Status  05/06/2021 173 100 - 199 mg/dL Final   Cholesterol Piccolo, Waived  Date Value Ref Range Status  05/23/2016 140 <200 mg/dL Final    Comment:                            Desirable                <200                         Borderline High      200- 239                         High                     >239    LDL Chol Calc (NIH)  Date Value Ref Range Status  05/06/2021 87 0 - 99 mg/dL Final   HDL  Date Value Ref Range Status  05/06/2021 74 >39 mg/dL Final   Triglycerides  Date Value Ref Range Status  05/06/2021 64 0 - 149 mg/dL Final   Triglycerides Piccolo,Waived  Date Value Ref Range Status  05/23/2016 140 <150 mg/dL Final    Comment:                            Normal                   <150                         Borderline High     150 - 199                         High                200 - 499                         Very High                >499          Passed - Patient is not pregnant      Passed - Valid encounter within last 12 months    Recent Outpatient Visits          1 month ago Left arm pain   [facility] [place], [name], DO   2 months ago Upper respiratory tract infection, unspecified type   [facility] [place], Avanti, MD   2 months ago Benign prostatic hyperplasia with incomplete bladder emptying   [facility] [place], [name], DO   4 months ago Depression, major, single episode, moderate (HCC)   [facility] [place], [name], DO   6 months ago Primary hypertension   [facility]  Dorcas Carrow, DO      Future Appointments            In 2 weeks Laural Benes, [name], DO [facility], [facility]

## 2021-11-16 NOTE — Telephone Encounter (Signed)
Requested Prescriptions  Pending Prescriptions Disp Refills  . amLODipine (NORVASC) 10 MG tablet [Pharmacy Med Name: AMLODIPINE BESYLATE 10 MG TAB] 90 tablet 0    Sig: TAKE 1 TABLET BY MOUTH ONCE DAILY     Cardiovascular: Calcium Channel Blockers 2 Passed - 11/15/2021  9:05 AM      Passed - Last BP in normal range    BP Readings from Last 1 Encounters:  10/04/21 109/73         Passed - Last Heart Rate in normal range    Pulse Readings from Last 1 Encounters:  10/04/21 73         Passed - Valid encounter within last 6 months    Recent Outpatient Visits          1 month ago Left arm pain   [facility] [place], [name], DO   2 months ago Upper respiratory tract infection, unspecified type   [facility] [place], Avanti, MD   2 months ago Benign prostatic hyperplasia with incomplete bladder emptying   [facility] [place], [name], DO   4 months ago Depression, major, single episode, moderate (HCC)   [facility] [place], [place], DO   6 months ago Primary hypertension   [facility] [place], [place], DO      Future Appointments            In 2 weeks Laural Benes, [name], DO [facility], [facility]

## 2021-12-06 ENCOUNTER — Encounter: Payer: Self-pay | Admitting: Family Medicine

## 2021-12-06 ENCOUNTER — Ambulatory Visit: Payer: BC Managed Care – PPO | Admitting: Family Medicine

## 2021-12-06 ENCOUNTER — Telehealth (INDEPENDENT_AMBULATORY_CARE_PROVIDER_SITE_OTHER): Payer: BC Managed Care – PPO | Admitting: Family Medicine

## 2021-12-06 VITALS — BP 125/76 | HR 74 | Wt 145.0 lb

## 2021-12-06 DIAGNOSIS — G4700 Insomnia, unspecified: Secondary | ICD-10-CM

## 2021-12-06 DIAGNOSIS — F321 Major depressive disorder, single episode, moderate: Secondary | ICD-10-CM | POA: Diagnosis not present

## 2021-12-06 DIAGNOSIS — R3914 Feeling of incomplete bladder emptying: Secondary | ICD-10-CM

## 2021-12-06 DIAGNOSIS — E782 Mixed hyperlipidemia: Secondary | ICD-10-CM | POA: Diagnosis not present

## 2021-12-06 DIAGNOSIS — N401 Enlarged prostate with lower urinary tract symptoms: Secondary | ICD-10-CM | POA: Diagnosis not present

## 2021-12-06 DIAGNOSIS — I10 Essential (primary) hypertension: Secondary | ICD-10-CM

## 2021-12-06 MED ORDER — DUTASTERIDE 0.5 MG PO CAPS: 0.5000 mg | ORAL_CAPSULE | Freq: Every day | ORAL | 1 refills | Status: DC

## 2021-12-06 MED ORDER — AMLODIPINE BESYLATE 10 MG PO TABS: 10.0000 mg | ORAL_TABLET | Freq: Every day | ORAL | 1 refills | Status: DC

## 2021-12-06 MED ORDER — CLONAZEPAM 0.5 MG PO TABS: ORAL_TABLET | ORAL | 0 refills | Status: DC

## 2021-12-06 MED ORDER — ESZOPICLONE 3 MG PO TABS: ORAL_TABLET | ORAL | 2 refills | Status: DC

## 2021-12-06 NOTE — Assessment & Plan Note (Signed)
In slight exacerbation due to issues with his job. Refills of his klonopin given. 45 pills should last 2-3 months. Call with any concerns. Continue to monitor.

## 2021-12-06 NOTE — Assessment & Plan Note (Signed)
Under good control on current regimen. Continue current regimen. Continue to monitor. Call with any concerns. Refills given. Will get him in for labs ASAP.  

## 2021-12-06 NOTE — Assessment & Plan Note (Signed)
Under good control on current regimen. Continue current regimen. Continue to monitor. Call with any concerns. Refills given.   

## 2021-12-06 NOTE — Progress Notes (Signed)
BP 125/76   Pulse 74   Wt 145 lb (65.8 kg)   BMI 23.76 kg/m    Subjective:    Patient ID: Shawn Ware, male    DOB: 1970/09/25, 51 y.o.   MRN: [id number]  HPI: Shawn Ware is a 52 y.o. male  Chief Complaint  Patient presents with   Insomnia   Anxiety   Benign Prostatic Hypertrophy   ANXIETY/STRESS Duration: chronic Status:exacerbated Anxious mood: yes  Excessive worrying: yes Irritability: yes  Sweating: no Nausea: no Palpitations:no Hyperventilation: no Panic attacks: no Agoraphobia: no  Obscessions/compulsions: no Depressed mood: yes    12/06/2021    9:46 AM 09/09/2021   11:27 AM 07/06/2021    4:32 PM 05/07/2021   10:00 AM 01/22/2021   10:53 AM  Depression screen PHQ 2/9  Decreased Interest 2 3 2  0 1  Down, Depressed, Hopeless 1 3 2  0 1  PHQ - 2 Score 3 6 4  0 2  Altered sleeping 2 3 0 2 3  Tired, decreased energy 1 1 1 1 2   Change in appetite 0 3 0 0 1  Feeling bad or failure about yourself  1 1 1  0 2  Trouble concentrating 0 3 1 0 2  Moving slowly or fidgety/restless 0 2 0 0 1  Suicidal thoughts 0 0 0 0 0  PHQ-9 Score 7 19 7 3 13   Difficult doing work/chores Somewhat difficult Somewhat difficult Somewhat difficult     Anhedonia: no Weight changes: no Insomnia: yes   Hypersomnia: no Fatigue/loss of energy: yes Feelings of worthlessness: no Feelings of guilt: no Impaired concentration/indecisiveness: no Suicidal ideations: no  Crying spells: no Recent Stressors/Life Changes: yes   Relationship problems: no   Family stress: no     Financial stress: yes    Job stress: yes    Recent death/loss: no  HYPERTENSION / HYPERLIPIDEMIA Satisfied with current treatment? yes Duration of hypertension: chronic BP monitoring frequency: not checking BP medication side effects: no Past BP meds: amlodipine Duration of hyperlipidemia: chronic Cholesterol medication side effects: no Cholesterol supplements: none Past cholesterol medications:  atorvastatin Medication compliance: excellent compliance Aspirin: no Recent stressors: no Recurrent headaches: no Visual changes: no Palpitations: no Dyspnea: no Chest pain: no Lower extremity edema: no Dizzy/lightheaded: no  BPH BPH status: better Satisfied with current treatment?: yes Medication side effects: no Medication compliance: excellent compliance Duration: chronic Nocturia: 1/night Urinary frequency:no Incomplete voiding: no Urgency: no Weak urinary stream: no Straining to start stream: no Dysuria: no Onset: gradual Severity: mild  INSOMNIA Duration: chronic Satisfied with sleep quality: yes Difficulty falling asleep: no Difficulty staying asleep: no Waking a few hours after sleep onset: no Early morning awakenings: no Daytime hypersomnolence: no Wakes feeling refreshed: yes Good sleep hygiene: yes Apnea: no Snoring: no Depressed/anxious mood: yes Recent stress: yes Restless legs/nocturnal leg cramps: no Chronic pain/arthritis: no History of sleep study: no Treatments attempted:  lunesta, melatonin, uinsom, benadryl, and ambien    Relevant past medical, surgical, family and social history reviewed and updated as indicated. Interim medical history since our last visit reviewed. Allergies and medications reviewed and updated.  Review of Systems  Constitutional: Negative.   Respiratory: Negative.    Cardiovascular: Negative.   Gastrointestinal: Negative.   Musculoskeletal: Negative.   Psychiatric/Behavioral:  Negative for agitation, behavioral problems, confusion, decreased concentration, dysphoric mood, hallucinations, self-injury, sleep disturbance and suicidal ideas. The patient is nervous/anxious. The patient is not hyperactive.     Per HPI unless specifically indicated above  Objective:    BP 125/76   Pulse 74   Wt 145 lb (65.8 kg)   BMI 23.76 kg/m   Wt Readings from Last 3 Encounters:  12/06/21 145 lb (65.8 kg)  10/04/21 150 lb  9.6 oz (68.3 kg)  09/03/21 145 lb (65.8 kg)    Physical Exam Constitutional:      General: He is not in acute distress.    Appearance: Normal appearance. He is well-developed and normal weight. He is not ill-appearing, toxic-appearing or diaphoretic.  HENT:     Head: Normocephalic and atraumatic.     Right Ear: Hearing and external ear normal.     Left Ear: Hearing and external ear normal.     Nose: Nose normal.  Eyes:     General: Lids are normal. No scleral icterus.       Right eye: No discharge.        Left eye: No discharge.     Extraocular Movements: Extraocular movements intact.     Conjunctiva/sclera: Conjunctivae normal.     Pupils: Pupils are equal, round, and reactive to light.  Pulmonary:     Effort: Pulmonary effort is normal. No respiratory distress.  Musculoskeletal:        General: Normal range of motion.     Cervical back: Normal range of motion.  Skin:    Coloration: Skin is not jaundiced or pale.     Findings: No bruising, erythema, lesion or rash.  Neurological:     General: No focal deficit present.     Mental Status: He is alert and oriented to person, place, and time.  Psychiatric:        Mood and Affect: Mood normal.        Speech: Speech normal.        Behavior: Behavior normal.        Thought Content: Thought content normal.        Judgment: Judgment normal.     Results for orders placed or performed in visit on 09/09/21  Rapid Strep screen([facility])   Specimen: Other   Other  Result Value Ref Range   Strep Gp A Ag, IA W/Reflex Negative Negative  Culture, Group A Strep   Other  Result Value Ref Range   Strep A Culture Negative   Veritor Flu A/B Waived  Result Value Ref Range   Influenza A Negative Negative   Influenza B Negative Negative      Assessment & Plan:   Problem List Items Addressed This Visit       Cardiovascular and Mediastinum   Hypertension    Under good control on current regimen. Continue current regimen.  Continue to monitor. Call with any concerns. Refills given. Will get him in for labs ASAP.        Relevant Medications   amLODipine (NORVASC) 10 MG tablet   Other Relevant Orders   Comprehensive metabolic panel     Genitourinary   Benign prostatic hyperplasia with incomplete bladder emptying    Under good control on current regimen. Continue current regimen. Continue to monitor. Call with any concerns. Refills given.        Relevant Medications   dutasteride (AVODART) 0.5 MG capsule     Other   Hyperlipidemia - Primary    Under good control on current regimen. Continue current regimen. Continue to monitor. Call with any concerns. Refills given. Will get him in for labs ASAP.        Relevant Medications   amLODipine (  NORVASC) 10 MG tablet   Other Relevant Orders   Comprehensive metabolic panel   Lipid Panel w/o Chol/HDL Ratio   Depression, major, single episode, moderate (HCC)    In slight exacerbation due to issues with his job. Refills of his klonopin given. 45 pills should last 2-3 months. Call with any concerns. Continue to monitor.       Insomnia    Under good control on current regimen. Continue current regimen. Continue to monitor. Call with any concerns. Refills given.          Follow up plan: Return in about 3 months (around 03/08/2022).   This visit was completed via video visit through MyChart due to the restrictions of the COVID-19 pandemic. All issues as above were discussed and addressed. Physical exam was done as above through visual confirmation on video through MyChart. If it was felt that the patient should be evaluated in the office, they were directed there. The patient verbally consented to this visit. Location of the patient: home Location of the provider: work Those involved with this call:  Provider: Olevia Perches, DO CMA: Rolley Sims, CMA Front Desk/Registration: [employer]  Time spent on call:  25 minutes with patient face to face via  video conference. More than 50% of this time was spent in counseling and coordination of care. 40 minutes total spent in review of patient's record and preparation of their chart.

## 2021-12-06 NOTE — Progress Notes (Signed)
Patient states he will call back to schedule his appointment

## 2022-01-25 ENCOUNTER — Ambulatory Visit: Payer: BC Managed Care – PPO | Admitting: Family Medicine

## 2022-02-04 ENCOUNTER — Ambulatory Visit: Payer: Self-pay

## 2022-02-04 NOTE — Telephone Encounter (Signed)
  Chief Complaint: medication request Symptoms: anxiety for flight Frequency: Monday evening Pertinent Negatives:NA Disposition: [] ED /[] Urgent Care (no appt availability in office) / [] Appointment(In office/virtual)/ []  [facility]/ [] Home Care/ [] Refused Recommended Disposition /[] [facility]/ [x]  Follow-up with PCP Additional Notes: pt states he is leaving Monday evening at 4pm and flying to [place], . Pt is asking for Ativan to help with flight. States Dr. has given in the past. Advised him I would send request but may be Monday AM before anyone fu with him. Pt verbalized understanding.  Summary: rx for fight   Pt called saying he is flying Monday and wants to know If he can be prescribed Ativan for the flight.   [facility].   Monday [phone]      Reason for Disposition  Prescription request for new medicine (not a refill)  Answer Assessment - Initial Assessment Questions 1. NAME of MEDICINE: "What medicine(s) are you calling about?"     Ativan  2. QUESTION: "What is your question?" (e.g., double dose of medicine, side effect)     Got flight on Monday evening at 4pm  3. PRESCRIBER: "Who prescribed the medicine?" Reason: if prescribed by specialist, call should be referred to that group.     Dr. Laural Benes  4. SYMPTOMS: "Do you have any symptoms?" If Yes, ask: "What symptoms are you having?"  "How bad are the symptoms (e.g., mild, moderate, severe)     Anxiety  Protocols used: Medication Question Call-A-AH

## 2022-02-07 ENCOUNTER — Other Ambulatory Visit: Payer: Self-pay | Admitting: Family Medicine

## 2022-02-07 MED ORDER — LORAZEPAM 0.5 MG PO TABS: 0.5000 mg | ORAL_TABLET | Freq: Once | ORAL | 0 refills | Status: DC | PRN

## 2022-02-15 ENCOUNTER — Ambulatory Visit: Payer: 59 | Admitting: Family Medicine

## 2022-02-15 ENCOUNTER — Encounter: Payer: Self-pay | Admitting: Family Medicine

## 2022-02-15 VITALS — BP 106/70 | HR 87 | Temp 99.7°F | Wt 150.8 lb

## 2022-02-15 DIAGNOSIS — R509 Fever, unspecified: Secondary | ICD-10-CM

## 2022-02-15 NOTE — Progress Notes (Signed)
BP 106/70   Pulse 87   Temp 99.7 F (37.6 C)   Wt 150 lb 12.8 oz (68.4 kg)   SpO2 96%   BMI 24.71 kg/m    Subjective:    Patient ID: Shawn Ware, male    DOB: [birth year], 51 y.o.   MRN: [id number]  HPI: Shawn Ware is a 51 y.o. male  Chief Complaint  Patient presents with   Fever    Patient states he's been having a fever since Sunday, body aches, and chills.    UPPER RESPIRATORY TRACT INFECTION Duration: 2 days Worst symptom: fever Fever: yes 101.8 Cough: no Shortness of breath: no Wheezing: no Chest pain: no Chest tightness: no Chest congestion: yes Nasal congestion: yes Runny nose: yes Post nasal drip: yes Sneezing: no Sore throat: no Swollen glands: no Sinus pressure: yes Headache: no Face pain: no Toothache: no Ear pain: no  Ear pressure: yes, bilateral  Eyes red/itching:no Eye drainage/crusting: no  Vomiting: no Rash: no Fatigue: yes Sick contacts: no Strep contacts: no  Context: worse Recurrent sinusitis: no Relief with OTC cold/cough medications: no  Treatments attempted: tylenol, cold and sinus   Relevant past medical, surgical, family and social history reviewed and updated as indicated. Interim medical history since our last visit reviewed. Allergies and medications reviewed and updated.  Review of Systems  Constitutional:  Positive for fatigue and fever. Negative for activity change, appetite change, chills, diaphoresis and unexpected weight change.  HENT:  Positive for congestion, ear pain, postnasal drip, rhinorrhea and sneezing. Negative for dental problem, drooling, ear discharge, facial swelling, hearing loss, mouth sores, nosebleeds, sinus pressure, sinus pain, sore throat, tinnitus, trouble swallowing and voice change.   Eyes: Negative.   Respiratory: Negative.  Negative for apnea, cough, choking, chest tightness, shortness of breath, wheezing and stridor.   Cardiovascular: Negative.   Gastrointestinal: Negative.    Psychiatric/Behavioral: Negative.      Per HPI unless specifically indicated above     Objective:    BP 106/70   Pulse 87   Temp 99.7 F (37.6 C)   Wt 150 lb 12.8 oz (68.4 kg)   SpO2 96%   BMI 24.71 kg/m   Wt Readings from Last 3 Encounters:  02/15/22 150 lb 12.8 oz (68.4 kg)  12/06/21 145 lb (65.8 kg)  10/04/21 150 lb 9.6 oz (68.3 kg)    Physical Exam Vitals and nursing note reviewed.  Constitutional:      General: He is not in acute distress.    Appearance: Normal appearance. He is not ill-appearing, toxic-appearing or diaphoretic.  HENT:     Head: Normocephalic and atraumatic.     Right Ear: External ear normal.     Left Ear: External ear normal.     Nose: Nose normal.     Mouth/Throat:     Mouth: Mucous membranes are moist.     Pharynx: Oropharynx is clear.  Eyes:     General: No scleral icterus.       Right eye: No discharge.        Left eye: No discharge.     Extraocular Movements: Extraocular movements intact.     Conjunctiva/sclera: Conjunctivae normal.     Pupils: Pupils are equal, round, and reactive to light.  Cardiovascular:     Rate and Rhythm: Normal rate and regular rhythm.     Pulses: Normal pulses.     Heart sounds: Normal heart sounds. No murmur heard.    No friction rub. No gallop.  Pulmonary:     Effort: Pulmonary effort is normal. No respiratory distress.     Breath sounds: Normal breath sounds. No stridor. No wheezing, rhonchi or rales.  Chest:     Chest wall: No tenderness.  Musculoskeletal:        General: Normal range of motion.     Cervical back: Normal range of motion and neck supple.  Skin:    General: Skin is warm and dry.     Capillary Refill: Capillary refill takes less than 2 seconds.     Coloration: Skin is not jaundiced or pale.     Findings: No bruising, erythema, lesion or rash.  Neurological:     General: No focal deficit present.     Mental Status: He is alert and oriented to person, place, and time. Mental status is  at baseline.  Psychiatric:        Mood and Affect: Mood normal.        Behavior: Behavior normal.        Thought Content: Thought content normal.        Judgment: Judgment normal.     Results for orders placed or performed in visit on 09/09/21  Rapid Strep screen([facility])   Specimen: Other   Other  Result Value Ref Range   Strep Gp A Ag, IA W/Reflex Negative Negative  Culture, Group A Strep   Other  Result Value Ref Range   Strep A Culture Negative   Veritor Flu A/B Waived  Result Value Ref Range   Influenza A Negative Negative   Influenza B Negative Negative      Assessment & Plan:   Problem List Items Addressed This Visit   None Visit Diagnoses     Fever, unspecified fever cause    -  Primary   Will check labs. Lungs clear. Await covid test. Symptomatic care. Follow up 4 weeks. Call with any concerns.    Relevant Orders   Influenza a and b   Rapid Strep screen([facility])   Novel Coronavirus, NAA ([facility])   CBC with Differential/Platelet   Comprehensive metabolic panel        Follow up plan: Return in about 4 weeks (around 03/15/2022).

## 2022-02-16 ENCOUNTER — Encounter: Payer: Self-pay | Admitting: Family Medicine

## 2022-02-16 ENCOUNTER — Telehealth: Payer: Self-pay | Admitting: Family Medicine

## 2022-02-16 MED ORDER — MOLNUPIRAVIR EUA 200MG CAPSULE: 4.0000 | ORAL_CAPSULE | Freq: Two times a day (BID) | ORAL | 0 refills | Status: AC

## 2022-02-16 NOTE — Telephone Encounter (Signed)
Pt called to let Dr. Laural Benes know he did 2 rapid covid test last night and they were positive / he was waiting on the test he did in office but wanted to see if Dr. Laural Benes can send in some medication for him and an antibiotic due to the green mucus congestion / please advise   He is using [facility] [place], [place] - [address]

## 2022-02-16 NOTE — Telephone Encounter (Signed)
Pt called back and informed pt of Dr Henriette Combs note- no further questions at this time.

## 2022-02-16 NOTE — Telephone Encounter (Signed)
See telephone call

## 2022-02-16 NOTE — Telephone Encounter (Signed)
Antibiotic is not appropriate. Molnopirovir sent to his pharmacy.

## 2022-02-17 ENCOUNTER — Encounter: Payer: Self-pay | Admitting: Family Medicine

## 2022-02-17 LAB — NOVEL CORONAVIRUS, NAA: SARS-CoV-2, NAA: DETECTED — AB

## 2022-02-18 ENCOUNTER — Other Ambulatory Visit: Payer: Self-pay | Admitting: Family Medicine

## 2022-02-18 LAB — VERITOR FLU A/B WAIVED
Influenza A: NEGATIVE
Influenza B: NEGATIVE

## 2022-02-18 LAB — CULTURE, GROUP A STREP

## 2022-02-18 LAB — RAPID STREP SCREEN (MED CTR MEBANE ONLY): Strep Gp A Ag, IA W/Reflex: NEGATIVE

## 2022-02-18 MED ORDER — CLINDAMYCIN HCL 300 MG PO CAPS: 300.0000 mg | ORAL_CAPSULE | Freq: Three times a day (TID) | ORAL | 0 refills | Status: DC

## 2022-02-20 ENCOUNTER — Encounter: Payer: Self-pay | Admitting: Family Medicine

## 2022-02-21 ENCOUNTER — Other Ambulatory Visit: Payer: Self-pay | Admitting: Family Medicine

## 2022-02-21 MED ORDER — ESZOPICLONE 3 MG PO TABS
ORAL_TABLET | ORAL | 2 refills | Status: DC
Start: 2022-03-06 — End: 2022-06-06

## 2022-02-25 ENCOUNTER — Other Ambulatory Visit: Payer: Self-pay | Admitting: Family Medicine

## 2022-02-25 NOTE — Telephone Encounter (Signed)
Requested medication (s) are due for refill today - no  Requested medication (s) are on the active medication list -yes  Future visit scheduled -no  Last refill: 03/06/22 #30 2RF  Notes to clinic: duplicate request- no delegated Rx  Requested Prescriptions  Pending Prescriptions Disp Refills   Eszopiclone 3 MG TABS 30 tablet 2    Sig: TAKE 1 TABLET BY MOUTH AT BEDTIME AS NEEDED. TAKE IMMEDIATELTY BEFORE BEDTIME     Not Delegated - Psychiatry:  Anxiolytics/Hypnotics Failed - 02/25/2022  8:58 AM      Failed - This refill cannot be delegated      Passed - Urine Drug Screen completed in last 360 days      Passed - Valid encounter within last 6 months    Recent Outpatient Visits           1 week ago Fever, unspecified fever cause   [facility] [place], [name], DO   2 months ago Mixed hyperlipidemia   [facility], [name], DO   4 months ago Left arm pain   [facility] [place], [name], DO   5 months ago Upper respiratory tract infection, unspecified type   [facility], Avanti, MD   5 months ago Benign prostatic hyperplasia with incomplete bladder emptying   [facility] [place], [name], DO                 Requested Prescriptions  Pending Prescriptions Disp Refills   Eszopiclone 3 MG TABS 30 tablet 2    Sig: TAKE 1 TABLET BY MOUTH AT BEDTIME AS NEEDED. TAKE IMMEDIATELTY BEFORE BEDTIME     Not Delegated - Psychiatry:  Anxiolytics/Hypnotics Failed - 02/25/2022  8:58 AM      Failed - This refill cannot be delegated      Passed - Urine Drug Screen completed in last 360 days      Passed - Valid encounter within last 6 months    Recent Outpatient Visits           1 week ago Fever, unspecified fever cause   [facility] [place], [name], DO   2 months ago Mixed hyperlipidemia   [facility], [name], DO   4 months ago Left arm pain   [facility]  [place], [name], DO   5 months ago Upper respiratory tract infection, unspecified type   [facility], Avanti, MD   5 months ago Benign prostatic hyperplasia with incomplete bladder emptying   [facility] [place], [name], DO

## 2022-02-25 NOTE — Telephone Encounter (Signed)
Medication Refill - Medication: Medication Eszopiclone 3 MG TABS [40322] Eszopiclone 3 MG TABS [[id number]]      Has the patient contacted their pharmacy? Yes.   (Agent: If no, request that the patient contact the pharmacy for the refill. If patient does not wish to contact the pharmacy document the reason why and proceed with request.) (Agent: If yes, when and what did the pharmacy advise?)  Preferred Pharmacy (with phone number or street name):  [facility] [place] [place] - [address]  [address] [place] [address]  Phone: [phone] Fax: [phone]  Hours: Not open 24 hours   Has the patient been seen for an appointment in the last year OR does the patient have an upcoming appointment? Yes.    Agent: Please be advised that RX refills may take up to 3 business days. We ask that you follow-up with your pharmacy.

## 2022-03-02 ENCOUNTER — Telehealth (INDEPENDENT_AMBULATORY_CARE_PROVIDER_SITE_OTHER): Payer: 59 | Admitting: Family Medicine

## 2022-03-02 ENCOUNTER — Encounter: Payer: Self-pay | Admitting: Family Medicine

## 2022-03-02 DIAGNOSIS — M25511 Pain in right shoulder: Secondary | ICD-10-CM

## 2022-03-02 DIAGNOSIS — F321 Major depressive disorder, single episode, moderate: Secondary | ICD-10-CM

## 2022-03-02 MED ORDER — CLONAZEPAM 0.5 MG PO TABS: ORAL_TABLET | ORAL | 0 refills | Status: DC

## 2022-03-02 MED ORDER — NAPROXEN 500 MG PO TABS: 500.0000 mg | ORAL_TABLET | Freq: Two times a day (BID) | ORAL | 1 refills | Status: DC

## 2022-03-02 NOTE — Assessment & Plan Note (Signed)
Under good control on current regimen. Continue current regimen. Continue to monitor. Call with any concerns. Refills given. 45 pills should last 2-3 months. Encouraged use of Buspar prior to klonopin and saving klonopin for panic attacks.

## 2022-03-02 NOTE — Progress Notes (Signed)
PT states he will call back to schedule his physical

## 2022-03-02 NOTE — Progress Notes (Signed)
BP 125/78    Subjective:    Patient ID: Shawn Ware, male    DOB: 1970-11-15, 51 y.o.   MRN: [id number]  HPI: Shawn Ware is a 51 y.o. male  Chief Complaint  Patient presents with   Covid Positive    Wants to follow up. Patient states that he is feeling much better.     Anxiety   Feeling better from COVID. No coughing or wheezing. Still a little congested  ANXIETY/STRESS- taking his klonopin taking it about every other day when he was sick, usually taking it 2-3x a week. Has not been using his buspar Duration: chronic Status:stable Anxious mood: yes  Excessive worrying: yes Irritability: yes  Sweating: no Nausea: no Palpitations:no Hyperventilation: no Panic attacks: no Agoraphobia: no  Obscessions/compulsions: no Depressed mood: yes    03/02/2022   10:33 AM 12/06/2021    9:46 AM 09/09/2021   11:27 AM 07/06/2021    4:32 PM 05/07/2021   10:00 AM  Depression screen PHQ 2/9  Decreased Interest 3 2 3 2  0  Down, Depressed, Hopeless 1 1 3 2  0  PHQ - 2 Score 4 3 6 4  0  Altered sleeping 1 2 3  0 2  Tired, decreased energy 1 1 1 1 1   Change in appetite 1 0 3 0 0  Feeling bad or failure about yourself  0 1 1 1  0  Trouble concentrating 1 0 3 1 0  Moving slowly or fidgety/restless 1 0 2 0 0  Suicidal thoughts 0 0 0 0 0  PHQ-9 Score 9 7 19 7 3   Difficult doing work/chores Somewhat difficult Somewhat difficult Somewhat difficult Somewhat difficult    Anhedonia: no Weight changes: no Insomnia: yes   Hypersomnia: no Fatigue/loss of energy: no Feelings of worthlessness: no Feelings of guilt: no Impaired concentration/indecisiveness: no Suicidal ideations: no  Crying spells: no Recent Stressors/Life Changes: yes   Relationship problems: no   Family stress: yes     Financial stress: yes    Job stress: yes    Recent death/loss: no  ARM PAIN Duration: 2-3 months Location: right deltoid Mechanism of injury: unknown Onset: sudden Severity: severe  Quality:   shooting Frequency: constant waxing and waning Radiation: into his arm and into neck Aggravating factors: laying on it and movement and lifting  Alleviating factors: nothing  Status: worse Treatments attempted: occasional ibuprofen  Relief with NSAIDs?:  mild Swelling: no Redness: no  Warmth: no Trauma: no Chest pain: no  Shortness of breath: no  Fever: no Decreased sensation: no Paresthesias: no Weakness: no  Relevant past medical, surgical, family and social history reviewed and updated as indicated. Interim medical history since our last visit reviewed. Allergies and medications reviewed and updated.  Review of Systems  Constitutional: Negative.   Respiratory: Negative.    Cardiovascular: Negative.   Gastrointestinal: Negative.   Musculoskeletal:  Positive for arthralgias and myalgias. Negative for back pain, gait problem, joint swelling, neck pain and neck stiffness.  Skin: Negative.   Psychiatric/Behavioral:  Positive for dysphoric mood. Negative for agitation, behavioral problems, confusion, decreased concentration, hallucinations, self-injury, sleep disturbance and suicidal ideas. The patient is nervous/anxious. The patient is not hyperactive.     Per HPI unless specifically indicated above     Objective:    BP 125/78   Wt Readings from Last 3 Encounters:  02/15/22 150 lb 12.8 oz (68.4 kg)  12/06/21 145 lb (65.8 kg)  10/04/21 150 lb 9.6 oz (68.3 kg)    Physical  Exam Vitals and nursing note reviewed.  Constitutional:      General: He is not in acute distress.    Appearance: Normal appearance. He is not ill-appearing, toxic-appearing or diaphoretic.  HENT:     Head: Normocephalic and atraumatic.     Right Ear: External ear normal.     Left Ear: External ear normal.     Nose: Nose normal.     Mouth/Throat:     Mouth: Mucous membranes are moist.     Pharynx: Oropharynx is clear.  Eyes:     General: No scleral icterus.       Right eye: No discharge.         Left eye: No discharge.     Conjunctiva/sclera: Conjunctivae normal.     Pupils: Pupils are equal, round, and reactive to light.  Pulmonary:     Effort: Pulmonary effort is normal. No respiratory distress.     Comments: Speaking in full sentences Musculoskeletal:        General: Normal range of motion.     Cervical back: Normal range of motion.  Skin:    Coloration: Skin is not jaundiced or pale.     Findings: No bruising, erythema, lesion or rash.  Neurological:     Mental Status: He is alert and oriented to person, place, and time. Mental status is at baseline.  Psychiatric:        Mood and Affect: Mood normal.        Behavior: Behavior normal.        Thought Content: Thought content normal.        Judgment: Judgment normal.     Results for orders placed or performed in visit on 02/15/22  Rapid Strep screen([facility])   Specimen: Other   Other  Result Value Ref Range   Strep Gp A Ag, IA W/Reflex Negative Negative  Novel Coronavirus, NAA ([facility])   Specimen: Saline  Result Value Ref Range   SARS-CoV-2, NAA Detected (A) Not Detected  Culture, Group A Strep   Other  Result Value Ref Range   Strep A Culture Comment (A)   Veritor Flu A/B Waived  Result Value Ref Range   Influenza A Negative Negative   Influenza B Negative Negative      Assessment & Plan:   Problem List Items Addressed This Visit       Other   Depression, major, single episode, moderate (HCC)    Under good control on current regimen. Continue current regimen. Continue to monitor. Call with any concerns. Refills given. 45 pills should last 2-3 months. Encouraged use of Buspar prior to klonopin and saving klonopin for panic attacks.       Right shoulder pain    Concern for tendonitis. Will start naproxen and stretches. Let us know if not better in 2 weeks and we'll consider imaging/PT/ortho referral.         Follow up plan: Return in about 3 months (around 06/01/2022) for  physical.   This visit was completed via video visit through MyChart due to the restrictions of the COVID-19 pandemic. All issues as above were discussed and addressed. Physical exam was done as above through visual confirmation on video through MyChart. If it was felt that the patient should be evaluated in the office, they were directed there. The patient verbally consented to this visit. Location of the patient: home Location of the provider: work Those involved with this call:  Provider: Olevia Perches, DO CMA: Tristan Schroeder, CMA Front  Desk/Registration: [employer]  Time spent on call:  25 minutes with patient face to face via video conference. More than 50% of this time was spent in counseling and coordination of care. 40 minutes total spent in review of patient's record and preparation of their chart.

## 2022-03-02 NOTE — Assessment & Plan Note (Signed)
Concern for tendonitis. Will start naproxen and stretches. Let us know if not better in 2 weeks and we'll consider imaging/PT/ortho referral.

## 2022-03-10 ENCOUNTER — Encounter: Payer: Self-pay | Admitting: Family Medicine

## 2022-03-15 MED ORDER — PREDNISONE 10 MG PO TABS: ORAL_TABLET | ORAL | 0 refills | Status: DC

## 2022-03-15 NOTE — Telephone Encounter (Addendum)
Please make patient aware that I only returned yesterday. I will send him in a course of prednisone, but if he is not better, he will need to do PT or go to ortho. His note will be on his mychart. I said he could return 8/31. I did not keep him out until 9/12. I cannot give him a note from 8/31-9/12

## 2022-03-15 NOTE — Telephone Encounter (Signed)
Copied from [place] [id number]. Topic: General - Other >> Mar 15, 2022  2:23 PM Leitha Schuller wrote: Pt checking status of predinsone request he mad via mychart that states   " Hi Dr Wynetta Emery, I reached out to you last week about my arm pain was asking if I could get some prednisone first and foremost before before going to physical therapy. I think you were at the office and your back up was going to relay the message. Please let me know if we are able to do that. I'm having a lot of difficulty with my arm conversations with Gerald Stabs."  Please fu w/ pt

## 2022-03-15 NOTE — Telephone Encounter (Signed)
Patient called to get an update on the message he sent pcp on 9/14. Please follow up with patient.

## 2022-03-16 NOTE — Telephone Encounter (Signed)
Called patient and made aware of provider advise and medication at pharmacy. Patient states someone has already called him and notified him. No further questions.

## 2022-03-21 ENCOUNTER — Other Ambulatory Visit: Payer: Self-pay | Admitting: Family Medicine

## 2022-03-21 DIAGNOSIS — M25511 Pain in right shoulder: Secondary | ICD-10-CM

## 2022-03-23 MED ORDER — CYCLOBENZAPRINE HCL 10 MG PO TABS: 10.0000 mg | ORAL_TABLET | Freq: Every day | ORAL | 0 refills | Status: DC

## 2022-03-23 NOTE — Addendum Note (Signed)
Addended by: Valerie Roys on: 03/23/2022 09:06 PM   Modules accepted: Orders

## 2022-05-20 ENCOUNTER — Other Ambulatory Visit: Payer: Self-pay | Admitting: Family Medicine

## 2022-05-23 NOTE — Telephone Encounter (Signed)
Called patient and made an appointment for Dec 11th @ 9:40 am.

## 2022-05-23 NOTE — Telephone Encounter (Signed)
Requested medications are due for refill today.  yes  Requested medications are on the active medications list.  yes  Last refill. 03/06/2022 #30 2 rf  Future visit scheduled.   no  Notes to clinic.  Refill not delegated    Requested Prescriptions  Pending Prescriptions Disp Refills   Eszopiclone 3 MG TABS [Pharmacy Med Name: ESZOPICLONE 3 MG TAB] 30 tablet     Sig: TAKE 1 TABLET BY MOUTH AT BEDTIME AS NEEDED. TAKE IMMIEDATELY BEFORE BEDTIME     Not Delegated - Psychiatry:  Anxiolytics/Hypnotics Failed - 05/20/2022  8:02 AM      Failed - This refill cannot be delegated      Failed - Urine Drug Screen completed in last 360 days      Passed - Valid encounter within last 6 months    Recent Outpatient Visits           2 months ago Acute pain of right shoulder   [facility] [place], [name], DO   3 months ago Fever, unspecified fever cause   [facility] [place], [name], DO   5 months ago Mixed hyperlipidemia   [facility], [name], DO   7 months ago Left arm pain   [facility], [name], DO   8 months ago Upper respiratory tract infection, unspecified type   [facility], Avanti, MD

## 2022-05-23 NOTE — Telephone Encounter (Signed)
appt

## 2022-06-06 ENCOUNTER — Encounter: Payer: Self-pay | Admitting: Family Medicine

## 2022-06-06 ENCOUNTER — Ambulatory Visit (INDEPENDENT_AMBULATORY_CARE_PROVIDER_SITE_OTHER): Payer: Self-pay | Admitting: Family Medicine

## 2022-06-06 VITALS — BP 117/73 | HR 81 | Temp 98.3°F | Ht 66.5 in | Wt 153.5 lb

## 2022-06-06 DIAGNOSIS — G4700 Insomnia, unspecified: Secondary | ICD-10-CM

## 2022-06-06 DIAGNOSIS — I10 Essential (primary) hypertension: Secondary | ICD-10-CM

## 2022-06-06 DIAGNOSIS — F321 Major depressive disorder, single episode, moderate: Secondary | ICD-10-CM

## 2022-06-06 DIAGNOSIS — E782 Mixed hyperlipidemia: Secondary | ICD-10-CM

## 2022-06-06 MED ORDER — AMLODIPINE BESYLATE 10 MG PO TABS: 10.0000 mg | ORAL_TABLET | Freq: Every day | ORAL | 1 refills | Status: DC

## 2022-06-06 MED ORDER — CYCLOBENZAPRINE HCL 10 MG PO TABS: 10.0000 mg | ORAL_TABLET | Freq: Every day | ORAL | 0 refills | Status: DC

## 2022-06-06 MED ORDER — ATORVASTATIN CALCIUM 40 MG PO TABS: ORAL_TABLET | ORAL | 1 refills | Status: DC

## 2022-06-06 MED ORDER — ESZOPICLONE 3 MG PO TABS: ORAL_TABLET | ORAL | 2 refills | Status: DC

## 2022-06-06 MED ORDER — CLONAZEPAM 0.5 MG PO TABS: ORAL_TABLET | ORAL | 0 refills | Status: DC

## 2022-06-06 NOTE — Progress Notes (Signed)
BP 117/73   Pulse 81   Temp 98.3 F (36.8 C) (Oral)   Ht 5' 6.5" (1.689 m)   Wt 153 lb 8 oz (69.6 kg)   SpO2 96%   BMI 24.40 kg/m    Subjective:    Patient ID: Shawn Ware, male    DOB: 1970-09-11, 51 y.o.   MRN: [id number]  HPI: Shawn Ware is a 51 y.o. male  Chief Complaint  Patient presents with   Hypertension   Hyperlipidemia   Anxiety   Insomnia        HYPERTENSION / HYPERLIPIDEMIA Satisfied with current treatment? yes Duration of hypertension: chronic BP monitoring frequency: not checking BP medication side effects: no Past BP meds: amlodipine Duration of hyperlipidemia: chronic Cholesterol medication side effects: no Cholesterol supplements: none Past cholesterol medications: atorvastatin Medication compliance: good compliance Aspirin: no Recent stressors: yes Recurrent headaches: no Visual changes: no Palpitations: no Dyspnea: no Chest pain: no Lower extremity edema: no Dizzy/lightheaded: no  INSOMNIA Duration: chronic Satisfied with sleep quality: yes Difficulty falling asleep: no Difficulty staying asleep: no Waking a few hours after sleep onset: no Early morning awakenings: no Daytime hypersomnolence: no Wakes feeling refreshed: no Good sleep hygiene: yes Apnea: no Snoring: no Depressed/anxious mood: yes Recent stress: yes Restless legs/nocturnal leg cramps: no Chronic pain/arthritis: no History of sleep study: yes Treatments attempted:  lunesta, melatonin, uinsom, benadryl, and ambien   ANXIETY/DEPRESSION Duration: chronic Status:stable Anxious mood: yes  Excessive worrying: no Irritability: no  Sweating: no Nausea: no Palpitations:no Hyperventilation: no Panic attacks: no Agoraphobia: no  Obscessions/compulsions: no Depressed mood: yes    06/06/2022    9:45 AM 03/02/2022   10:33 AM 12/06/2021    9:46 AM 09/09/2021   11:27 AM 07/06/2021    4:32 PM  Depression screen PHQ 2/9  Decreased Interest 2 3 2 3 2   Down,  Depressed, Hopeless 2 1 1 3 2   PHQ - 2 Score 4 4 3 6 4   Altered sleeping 2 1 2 3  0  Tired, decreased energy 2 1 1 1 1   Change in appetite 0 1 0 3 0  Feeling bad or failure about yourself  1 0 1 1 1   Trouble concentrating 1 1 0 3 1  Moving slowly or fidgety/restless 0 1 0 2 0  Suicidal thoughts 0 0 0 0 0  PHQ-9 Score 10 9 7 19 7   Difficult doing work/chores Somewhat difficult Somewhat difficult Somewhat difficult Somewhat difficult Somewhat difficult   Anhedonia: no Weight changes: no Insomnia: no   Hypersomnia: no Fatigue/loss of energy: no Feelings of worthlessness: no Feelings of guilt: no Impaired concentration/indecisiveness: no Suicidal ideations: no  Crying spells: no Recent Stressors/Life Changes: no   Relationship problems: no   Family stress: no     Financial stress: yes    Job stress: yes    Recent death/loss: no  Relevant past medical, surgical, family and social history reviewed and updated as indicated. Interim medical history since our last visit reviewed. Allergies and medications reviewed and updated.  Review of Systems  Constitutional: Negative.   Respiratory: Negative.    Cardiovascular: Negative.   Gastrointestinal: Negative.   Musculoskeletal: Negative.   Psychiatric/Behavioral: Negative.      Per HPI unless specifically indicated above     Objective:    BP 117/73   Pulse 81   Temp 98.3 F (36.8 C) (Oral)   Ht 5' 6.5" (1.689 m)   Wt 153 lb 8 oz (69.6 kg)  SpO2 96%   BMI 24.40 kg/m   Wt Readings from Last 3 Encounters:  06/06/22 153 lb 8 oz (69.6 kg)  02/15/22 150 lb 12.8 oz (68.4 kg)  12/06/21 145 lb (65.8 kg)    Physical Exam Vitals and nursing note reviewed.  Constitutional:      General: He is not in acute distress.    Appearance: Normal appearance. He is not ill-appearing, toxic-appearing or diaphoretic.  HENT:     Head: Normocephalic and atraumatic.     Right Ear: External ear normal.     Left Ear: External ear normal.      Nose: Nose normal.     Mouth/Throat:     Mouth: Mucous membranes are moist.     Pharynx: Oropharynx is clear.  Eyes:     General: No scleral icterus.       Right eye: No discharge.        Left eye: No discharge.     Extraocular Movements: Extraocular movements intact.     Conjunctiva/sclera: Conjunctivae normal.     Pupils: Pupils are equal, round, and reactive to light.  Cardiovascular:     Rate and Rhythm: Normal rate and regular rhythm.     Pulses: Normal pulses.     Heart sounds: Normal heart sounds. No murmur heard.    No friction rub. No gallop.  Pulmonary:     Effort: Pulmonary effort is normal. No respiratory distress.     Breath sounds: Normal breath sounds. No stridor. No wheezing, rhonchi or rales.  Chest:     Chest wall: No tenderness.  Musculoskeletal:        General: Normal range of motion.     Cervical back: Normal range of motion and neck supple.  Skin:    General: Skin is warm and dry.     Capillary Refill: Capillary refill takes less than 2 seconds.     Coloration: Skin is not jaundiced or pale.     Findings: No bruising, erythema, lesion or rash.  Neurological:     General: No focal deficit present.     Mental Status: He is alert and oriented to person, place, and time. Mental status is at baseline.  Psychiatric:        Mood and Affect: Mood normal.        Behavior: Behavior normal.        Thought Content: Thought content normal.        Judgment: Judgment normal.     Results for orders placed or performed in visit on 02/15/22  Rapid Strep screen([facility])   Specimen: Other   Other  Result Value Ref Range   Strep Gp A Ag, IA W/Reflex Negative Negative  Novel Coronavirus, NAA ([facility])   Specimen: Saline  Result Value Ref Range   SARS-CoV-2, NAA Detected (A) Not Detected  Culture, Group A Strep   Other  Result Value Ref Range   Strep A Culture Comment (A)   Veritor Flu A/B Waived  Result Value Ref Range   Influenza A Negative Negative    Influenza B Negative Negative      Assessment & Plan:   Problem List Items Addressed This Visit       Cardiovascular and Mediastinum   Hypertension - Primary    Under good control on current regimen. Continue current regimen. Continue to monitor. Call with any concerns. Refills given. BMP checked today.        Relevant Medications   amLODipine (NORVASC) 10 MG tablet  atorvastatin (LIPITOR) 40 MG tablet   Other Relevant Orders   Basic metabolic panel     Other   Hyperlipidemia    Under good control on current regimen. Continue current regimen. Continue to monitor. Call with any concerns. Refills given. Declines labs until has insurance- will check next visit.        Relevant Medications   amLODipine (NORVASC) 10 MG tablet   atorvastatin (LIPITOR) 40 MG tablet   Depression, major, single episode, moderate (HCC)    Stable. Declines medication. Continue to monitor. Call with any concerns. Refills of clonazepam given. Rx should last at least 3 months. Follow up 3 months.       Insomnia    Under good control on current regimen. Continue current regimen. Continue to monitor. Call with any concerns. Refills given for 3 months. Follow up 3 months.          Follow up plan: Return in about 3 months (around 09/05/2022) for physical.

## 2022-06-06 NOTE — Assessment & Plan Note (Addendum)
Stable. Declines medication. Continue to monitor. Call with any concerns. Refills of clonazepam given. Rx should last at least 3 months. Follow up 3 months.

## 2022-06-06 NOTE — Assessment & Plan Note (Signed)
Under good control on current regimen. Continue current regimen. Continue to monitor. Call with any concerns. Refills given. Declines labs until has insurance- will check next visit.

## 2022-06-06 NOTE — Assessment & Plan Note (Signed)
Under good control on current regimen. Continue current regimen. Continue to monitor. Call with any concerns. Refills given for 3 months. Follow up 3 months.    

## 2022-06-06 NOTE — Assessment & Plan Note (Signed)
Under good control on current regimen. Continue current regimen. Continue to monitor. Call with any concerns. Refills given. BMP checked today. ° °

## 2022-06-07 LAB — BASIC METABOLIC PANEL
BUN/Creatinine Ratio: 12 (ref 9–20)
BUN: 13 mg/dL (ref 6–24)
CO2: 23 mmol/L (ref 20–29)
Calcium: 9.1 mg/dL (ref 8.7–10.2)
Chloride: 104 mmol/L (ref 96–106)
Creatinine, Ser: 1.05 mg/dL (ref 0.76–1.27)
Glucose: 106 mg/dL — ABNORMAL HIGH (ref 70–99)
Potassium: 3.8 mmol/L (ref 3.5–5.2)
Sodium: 140 mmol/L (ref 134–144)
eGFR: 86 mL/min/{1.73_m2} (ref >59)

## 2022-06-24 ENCOUNTER — Ambulatory Visit: Payer: Self-pay | Admitting: *Deleted

## 2022-06-24 NOTE — Telephone Encounter (Signed)
Message from Sharene Skeans sent at 06/24/2022  8:03 AM EST  Summary: covid/ paxlovid   Pts symptoms started yesterday, vomiting, fever and body aches /he is asking for paxlovid / pt tested positive for covid /please advise          Call History   Type Contact Phone/Fax User  06/24/2022 08:02 AM EST Phone (Incoming) [place], [place] (Self) [phone] Jerilynn Mages) Alanda Slim E   Reason for Disposition  [1] COVID-19 diagnosed by positive lab test (e.g., PCR, rapid self-test kit) AND [2] NO symptoms (e.g., cough, fever, others)    Requesting Paxlovid     No appts available   Virtual visit  Answer Assessment - Initial Assessment Questions 1. COVID-19 DIAGNOSIS: "How do you know that you have COVID?" (e.g., positive lab test or self-test, diagnosed by doctor or NP/PA, symptoms after exposure).     Positive Covid home test.   2. COVID-19 EXPOSURE: "Was there any known exposure to COVID before the symptoms began?" CDC Definition of close contact: within 6 feet (2 meters) for a total of 15 minutes or more over a 24-hour period.      Not asked 3. ONSET: "When did the COVID-19 symptoms start?"      2-3 days ago with congestion 4. WORST SYMPTOM: "What is your worst symptom?" (e.g., cough, fever, shortness of breath, muscle aches)     Not asked 5. COUGH: "Do you have a cough?" If Yes, ask: "How bad is the cough?"       Cough a little bit 6. FEVER: "Do you have a fever?" If Yes, ask: "What is your temperature, how was it measured, and when did it start?"     Yes 7. RESPIRATORY STATUS: "Describe your breathing?" (e.g., normal; shortness of breath, wheezing, unable to speak)      No    Just chest congestion with green mucus 8. BETTER-SAME-WORSE: "Are you getting better, staying the same or getting worse compared to yesterday?"  If getting worse, ask, "In what way?"     Worse today 9. OTHER SYMPTOMS: "Do you have any other symptoms?"  (e.g., chills, fatigue, headache, loss of smell or taste, muscle pain,  sore throat)     Vomiting, fever, body aches.      Not vomiting this morning but last week.    No diarrhea 10. HIGH RISK DISEASE: "Do you have any chronic medical problems?" (e.g., asthma, heart or lung disease, weak immune system, obesity, etc.)       No 11. VACCINE: "Have you had the COVID-19 vaccine?" If Yes, ask: "Which one, how many shots, when did you get it?"       Not asked 12. PREGNANCY: "Is there any chance you are pregnant?" "When was your last menstrual period?"       N/A 13. O2 SATURATION MONITOR:  "Do you use an oxygen saturation monitor (pulse oximeter) at home?" If Yes, ask "What is your reading (oxygen level) today?" "What is your usual oxygen saturation reading?" (e.g., 95%)       N/A  Protocols used: Coronavirus (COVID-19) Diagnosed or Suspected-A-AH

## 2022-06-24 NOTE — Telephone Encounter (Signed)
  Chief Complaint: positive for Covid via home test yesterday Shawn Ware) Symptoms: Vomited yesterday, not today, fever, body aches, mild cough with green chest congestion.   Requesting Paxlovid.   Took it when he had Covid before. Frequency: Symptoms started 06/23/2022 Shawn Ware) Pertinent Negatives: Patient denies diarrhea or nasal congestion. Disposition: [] ED /[] Urgent Care (no appt availability in office) / [] Appointment(In office/virtual)/ [x]  [facility]/ [] Home Care/ [] Refused Recommended Disposition /[] [facility]/ []  Follow-up with PCP Additional Notes: No appts. Available at [facility].    I assisted him with making a virtual visit via MyChart.   He went through the process but got stuck at the end when he got a circle and it was processing but never went through after a few minutes.   He is going to try again.   He thanked me for my help and ended the call.

## 2022-08-31 ENCOUNTER — Ambulatory Visit (INDEPENDENT_AMBULATORY_CARE_PROVIDER_SITE_OTHER): Payer: Self-pay | Admitting: Family Medicine

## 2022-08-31 ENCOUNTER — Encounter: Payer: Self-pay | Admitting: Family Medicine

## 2022-08-31 VITALS — BP 115/75 | HR 87 | Temp 99.0°F | Ht 66.5 in | Wt 155.9 lb

## 2022-08-31 DIAGNOSIS — N410 Acute prostatitis: Secondary | ICD-10-CM

## 2022-08-31 DIAGNOSIS — R102 Pelvic and perineal pain: Secondary | ICD-10-CM

## 2022-08-31 DIAGNOSIS — F321 Major depressive disorder, single episode, moderate: Secondary | ICD-10-CM

## 2022-08-31 DIAGNOSIS — G4700 Insomnia, unspecified: Secondary | ICD-10-CM

## 2022-08-31 DIAGNOSIS — R3914 Feeling of incomplete bladder emptying: Secondary | ICD-10-CM

## 2022-08-31 DIAGNOSIS — N401 Enlarged prostate with lower urinary tract symptoms: Secondary | ICD-10-CM

## 2022-08-31 LAB — URINALYSIS, ROUTINE W REFLEX MICROSCOPIC
Bilirubin, UA: NEGATIVE
Glucose, UA: NEGATIVE
Ketones, UA: NEGATIVE
Nitrite, UA: NEGATIVE
RBC, UA: NEGATIVE
Specific Gravity, UA: 1.02 (ref 1.005–1.030)
Urobilinogen, Ur: 0.2 mg/dL (ref 0.2–1.0)
pH, UA: 5.5 (ref 5.0–7.5)

## 2022-08-31 LAB — MICROSCOPIC EXAMINATION: Bacteria, UA: NONE SEEN

## 2022-08-31 LAB — MICROALBUMIN, URINE WAIVED
Creatinine, Urine Waived: 200 mg/dL (ref 10–300)
Microalb, Ur Waived: 80 mg/L — ABNORMAL HIGH (ref 0–19)

## 2022-08-31 MED ORDER — CIPROFLOXACIN HCL 500 MG PO TABS: 500.0000 mg | ORAL_TABLET | Freq: Two times a day (BID) | ORAL | 0 refills | Status: DC

## 2022-08-31 MED ORDER — ESZOPICLONE 3 MG PO TABS: ORAL_TABLET | ORAL | 2 refills | Status: DC

## 2022-08-31 MED ORDER — TAMSULOSIN HCL 0.4 MG PO CAPS: 0.4000 mg | ORAL_CAPSULE | Freq: Every day | ORAL | 3 refills | Status: DC

## 2022-08-31 MED ORDER — CLONAZEPAM 0.5 MG PO TABS: ORAL_TABLET | ORAL | 0 refills | Status: DC

## 2022-08-31 NOTE — Assessment & Plan Note (Signed)
Under good control on current regimen. Continue current regimen. Continue to monitor. Call with any concerns. Refills given for 3 months. Follow up 3 months.    

## 2022-08-31 NOTE — Assessment & Plan Note (Signed)
Has been off meds. Not under good control. Will restart flomax and recheck in 3 months.

## 2022-08-31 NOTE — Assessment & Plan Note (Signed)
Under good control on current regimen. Continue current regimen. Continue to monitor. Call with any concerns. Refills given. Rx should last at least 3 months.

## 2022-08-31 NOTE — Progress Notes (Signed)
BP 115/75   Pulse 87   Temp 99 F (37.2 C) (Oral)   Ht 5' 6.5" (1.689 m)   Wt 155 lb 14.4 oz (70.7 kg)   SpO2 97%   BMI 24.79 kg/m    Subjective:    Patient ID: Shawn Ware, male    DOB: [birth year], 52 y.o.   MRN: [id number]  HPI: Demon Hotop is a 52 y.o. male  Chief Complaint  Patient presents with   Groin Pain    Patient says he is experiencing a lot of groin pain about two weeks ago. Patient says he thinks it may even be some enlargement of his prostate.    URINARY SYMPTOMS Duration: about a month Decreased Stream: yes Dribbling: yes Incomplete emptying: yes Dysuria: no Urinary frequency: yes Urgency: yes Nocturia: no Small volume voids: no Symptom severity: mild Urinary incontinence: no Foul odor: no Hematuria: no Abdominal pain: no Back pain: no Suprapubic pain/pressure: yes Flank pain: no Fever:  no Vomiting: no Relief with cranberry juice: no Relief with pyridium: no Status: stable Previous urinary tract infection: no Recurrent urinary tract infection: no Sexual activity: practicing safe sex History of sexually transmitted disease: no Penile discharge: no Treatments attempted: increasing fluids  INSOMNIA Duration: chronic Satisfied with sleep quality: yes Difficulty falling asleep: no Difficulty staying asleep: no Waking a few hours after sleep onset: no Early morning awakenings: no Daytime hypersomnolence: no Wakes feeling refreshed: yes Good sleep hygiene: yes Apnea: no Snoring: no Depressed/anxious mood: yes Recent stress: yes Restless legs/nocturnal leg cramps: no Chronic pain/arthritis: no History of sleep study: no Treatments attempted: melatonin, uinsom, benadryl, and ambien   ANXIETY/STRESS Duration: chronic Status:stable Anxious mood: yes  Excessive worrying: no Irritability: no  Sweating: no Nausea: no Palpitations:no Hyperventilation: no Panic attacks: no Agoraphobia: no  Obscessions/compulsions: no Depressed  mood: no    08/31/2022    8:58 AM 06/06/2022    9:45 AM 03/02/2022   10:33 AM 12/06/2021    9:46 AM 09/09/2021   11:27 AM  Depression screen PHQ 2/9  Decreased Interest '2 2 3 2 3  '$ Down, Depressed, Hopeless '2 2 1 1 3  '$ PHQ - 2 Score '4 4 4 3 6  '$ Altered sleeping '2 2 1 2 3  '$ Tired, decreased energy '2 2 1 1 1  '$ Change in appetite 0 0 1 0 3  Feeling bad or failure about yourself  2 1 0 1 1  Trouble concentrating '2 1 1 '$ 0 3  Moving slowly or fidgety/restless 0 0 1 0 2  Suicidal thoughts 0 0 0 0 0  PHQ-9 Score '12 10 9 7 19  '$ Difficult doing work/chores  Somewhat difficult Somewhat difficult Somewhat difficult Somewhat difficult      08/31/2022    8:58 AM 06/06/2022    9:46 AM 03/02/2022   10:34 AM 12/06/2021    9:47 AM  GAD 7 : Generalized Anxiety Score  Nervous, Anxious, on Edge '1 2 1 2  '$ Control/stop worrying '2 2 1 2  '$ Worry too much - different things '2 2 1 2  '$ Trouble relaxing '2 2 1 2  '$ Restless 2 2 0 1  Easily annoyed or irritable '2 2 1 2  '$ Afraid - awful might happen '2 2 1 1  '$ Total GAD 7 Score '13 14 6 12  '$ Anxiety Difficulty Somewhat difficult Somewhat difficult  Somewhat difficult   Anhedonia: no Weight changes: no Insomnia: yes hard to fall asleep  Hypersomnia: no Fatigue/loss of energy: yes Feelings of worthlessness:  no Feelings of guilt: no Impaired concentration/indecisiveness: no Suicidal ideations: no  Crying spells: no Recent Stressors/Life Changes: yes   Relationship problems: no   Family stress: no     Financial stress: no    Job stress: no    Recent death/loss: no   Relevant past medical, surgical, family and social history reviewed and updated as indicated. Interim medical history since our last visit reviewed. Allergies and medications reviewed and updated.  Review of Systems  Constitutional: Negative.   Respiratory: Negative.    Cardiovascular: Negative.   Genitourinary:  Positive for decreased urine volume, difficulty urinating and frequency. Negative for  dysuria, enuresis, flank pain, genital sores, hematuria, penile discharge, penile pain, penile swelling, scrotal swelling, testicular pain and urgency.  Musculoskeletal: Negative.   Psychiatric/Behavioral:  Positive for sleep disturbance. Negative for agitation, behavioral problems, confusion, decreased concentration, dysphoric mood, hallucinations, self-injury and suicidal ideas. The patient is nervous/anxious. The patient is not hyperactive.     Per HPI unless specifically indicated above     Objective:    BP 115/75   Pulse 87   Temp 99 F (37.2 C) (Oral)   Ht 5' 6.5" (1.689 m)   Wt 155 lb 14.4 oz (70.7 kg)   SpO2 97%   BMI 24.79 kg/m   Wt Readings from Last 3 Encounters:  08/31/22 155 lb 14.4 oz (70.7 kg)  06/06/22 153 lb 8 oz (69.6 kg)  02/15/22 150 lb 12.8 oz (68.4 kg)    Physical Exam Vitals and nursing note reviewed.  Constitutional:      General: He is not in acute distress.    Appearance: Normal appearance. He is normal weight. He is not ill-appearing, toxic-appearing or diaphoretic.  HENT:     Head: Normocephalic and atraumatic.     Right Ear: External ear normal.     Left Ear: External ear normal.     Nose: Nose normal.     Mouth/Throat:     Mouth: Mucous membranes are moist.     Pharynx: Oropharynx is clear.  Eyes:     General: No scleral icterus.       Right eye: No discharge.        Left eye: No discharge.     Extraocular Movements: Extraocular movements intact.     Conjunctiva/sclera: Conjunctivae normal.     Pupils: Pupils are equal, round, and reactive to light.  Cardiovascular:     Rate and Rhythm: Normal rate and regular rhythm.     Pulses: Normal pulses.     Heart sounds: Normal heart sounds. No murmur heard.    No friction rub. No gallop.  Pulmonary:     Effort: Pulmonary effort is normal. No respiratory distress.     Breath sounds: Normal breath sounds. No stridor. No wheezing, rhonchi or rales.  Chest:     Chest wall: No tenderness.   Musculoskeletal:        General: Normal range of motion.     Cervical back: Normal range of motion and neck supple.  Skin:    General: Skin is warm and dry.     Capillary Refill: Capillary refill takes less than 2 seconds.     Coloration: Skin is not jaundiced or pale.     Findings: No bruising, erythema, lesion or rash.  Neurological:     General: No focal deficit present.     Mental Status: He is alert and oriented to person, place, and time. Mental status is at baseline.  Psychiatric:  Mood and Affect: Mood normal.        Behavior: Behavior normal.        Thought Content: Thought content normal.        Judgment: Judgment normal.     Results for orders placed or performed in visit on [id number]  Basic metabolic panel  Result Value Ref Range   Glucose 106 (H) 70 - 99 mg/dL   BUN 13 6 - 24 mg/dL   Creatinine, Ser 1.05 0.76 - 1.27 mg/dL   eGFR 86 >59 mL/min/1.73   BUN/Creatinine Ratio 12 9 - 20   Sodium 140 134 - 144 mmol/L   Potassium 3.8 3.5 - 5.2 mmol/L   Chloride 104 96 - 106 mmol/L   CO2 23 20 - 29 mmol/L   Calcium 9.1 8.7 - 10.2 mg/dL      Assessment & Plan:   Problem List Items Addressed This Visit       Genitourinary   Benign prostatic hyperplasia with incomplete bladder emptying    Has been off meds. Not under good control. Will restart flomax and recheck in 3 months.       Relevant Medications   tamsulosin (FLOMAX) 0.4 MG CAPS capsule     Other   Depression, major, single episode, moderate (HCC)    Under good control on current regimen. Continue current regimen. Continue to monitor. Call with any concerns. Refills given. Rx should last at least 3 months.       Insomnia    Under good control on current regimen. Continue current regimen. Continue to monitor. Call with any concerns. Refills given for 3 months. Follow up 3 months.        Other Visit Diagnoses     Acute prostatitis    -  Primary   Will treat with flomax and cipro. Call with any  concerns or if not getting better.   Pelvic pain       Will treat with flomax and cipro. Call with any concerns or if not getting better.   Relevant Orders   PSA   Urinalysis, Routine w reflex microscopic   Microalbumin, Urine Waived        Follow up plan: Return in about 3 months (around 12/01/2022).

## 2022-09-01 LAB — PSA: Prostate Specific Ag, Serum: 2.6 ng/mL (ref 0.0–4.0)

## 2022-09-05 ENCOUNTER — Encounter: Payer: Self-pay | Admitting: Family Medicine

## 2022-09-17 ENCOUNTER — Other Ambulatory Visit: Payer: Self-pay | Admitting: Family Medicine

## 2022-09-19 NOTE — Telephone Encounter (Signed)
Unable to refill per protocol, Rx request is too soon. Last refill 08/31/22 for 30 and 2 refills.  Requested Prescriptions  Pending Prescriptions Disp Refills   Eszopiclone 3 MG TABS [Pharmacy Med Name: ESZOPICLONE 3 MG TAB] 30 tablet     Sig: TAKE 1 TABLET BY MOUTH AT BEDTIME AS NEEDED. TAKE IMMEDIATELY BEFORE [place]     Not Delegated - Psychiatry:  Anxiolytics/Hypnotics Failed - 09/17/2022  9:43 AM      Failed - This refill cannot be delegated      Failed - Urine Drug Screen completed in last 360 days      Passed - Valid encounter within last 6 months    Recent Outpatient Visits           2 weeks ago Acute prostatitis   [place], [employer], DO   3 months ago Primary hypertension   [place] P, DO   6 months ago Acute pain of right shoulder   [place] P, DO   7 months ago Fever, unspecified fever cause   [place] [facility] [place], [name], DO   9 months ago Mixed hyperlipidemia   [place], [place], DO

## 2022-11-16 ENCOUNTER — Other Ambulatory Visit: Payer: Self-pay | Admitting: Family Medicine

## 2022-11-17 NOTE — Telephone Encounter (Signed)
Requested medication (s) are due for refill today:no  Requested medication (s) are on the active medication list: yes  Last refill:  08/31/22 #30 2 RF  Future visit scheduled: no  Notes to clinic:  med not delegated to NT to RF   Requested Prescriptions  Pending Prescriptions Disp Refills   Eszopiclone 3 MG TABS [Pharmacy Med Name: ESZOPICLONE 3 MG TAB] 30 tablet     Sig: TAKE 1 TABLET BY MOUTH AT BEDTIME AS NEEDED. TAKE IMMEDIATELY BEFORE BEDTIM     Not Delegated - Psychiatry:  Anxiolytics/Hypnotics Failed - 11/16/2022  6:13 PM      Failed - This refill cannot be delegated      Failed - Urine Drug Screen completed in last 360 days      Passed - Valid encounter within last 6 months    Recent Outpatient Visits           2 months ago Acute prostatitis   [place] [facility] [place], [name], DO   5 months ago Primary hypertension   [place] [facility] [place], [name], DO   8 months ago Acute pain of right shoulder   [place] [facility] [place], [name], DO   9 months ago Fever, unspecified fever cause   [place] [facility] [place], [name], DO   11 months ago Mixed hyperlipidemia   [place] [facility] [place], [place], DO

## 2022-11-30 ENCOUNTER — Other Ambulatory Visit: Payer: Self-pay | Admitting: Family Medicine

## 2022-11-30 MED ORDER — AMLODIPINE BESYLATE 10 MG PO TABS: 10.0000 mg | ORAL_TABLET | Freq: Every day | ORAL | 0 refills | Status: DC

## 2022-11-30 NOTE — Telephone Encounter (Signed)
Requested Prescriptions  Pending Prescriptions Disp Refills   amLODipine (NORVASC) 10 MG tablet 90 tablet 0    Sig: Take 1 tablet (10 mg total) by mouth daily.     Cardiovascular: Calcium Channel Blockers 2 Passed - 11/30/2022 11:20 AM      Passed - Last BP in normal range    BP Readings from Last 1 Encounters:  08/31/22 115/75         Passed - Last Heart Rate in normal range    Pulse Readings from Last 1 Encounters:  08/31/22 87         Passed - Valid encounter within last 6 months    Recent Outpatient Visits           3 months ago Acute prostatitis   [place] [facility] [place], [name], DO   5 months ago Primary hypertension   [place] [facility] [place], [name], DO   9 months ago Acute pain of right shoulder   [place] [facility] [place], [name], DO   9 months ago Fever, unspecified fever cause   [place] [facility] [place], [name], DO   11 months ago Mixed hyperlipidemia   [place] [facility] [place], [name], DO       Future Appointments             In 3 weeks Laural Benes, [name], DO  [facility]

## 2022-11-30 NOTE — Telephone Encounter (Signed)
Medication Refill - Medication: amLODipine (NORVASC) 10 MG tablet   Has the patient contacted their pharmacy? Yes.   No refills. Contact provider.  Preferred Pharmacy (with phone number or street name):  [facility] [place], [place] - [address] Phone: [phone]  Fax: [phone]     Has the patient been seen for an appointment in the last year OR does the patient have an upcoming appointment? Yes.   12/27/22. Patient does not have insurance until 7/1. Patient is requesting a courtesy refill for June until he can be seen once his new insurance starts.  Agent: Please be advised that RX refills may take up to 3 business days. We ask that you follow-up with your pharmacy.

## 2022-11-30 NOTE — Telephone Encounter (Signed)
Duplicate request- filled today 11/30/22 #90 Requested Prescriptions  Pending Prescriptions Disp Refills   amLODipine (NORVASC) 10 MG tablet [Pharmacy Med Name: AMLODIPINE BESYLATE 10 MG TAB] 90 tablet 1    Sig: TAKE 1 TABLET BY MOUTH ONCE DAILY     Cardiovascular: Calcium Channel Blockers 2 Passed - 11/30/2022 10:21 AM      Passed - Last BP in normal range    BP Readings from Last 1 Encounters:  08/31/22 115/75         Passed - Last Heart Rate in normal range    Pulse Readings from Last 1 Encounters:  08/31/22 87         Passed - Valid encounter within last 6 months    Recent Outpatient Visits           3 months ago Acute prostatitis   [place] [facility] [place], [name], DO   5 months ago Primary hypertension   [place] [facility] [place], [name], DO   9 months ago Acute pain of right shoulder   [place] [facility] [place], [name], DO   9 months ago Fever, unspecified fever cause   [place] [facility] [place], [name], DO   11 months ago Mixed hyperlipidemia   [place] [facility] [place], [place], DO       Future Appointments             In 3 weeks Laural Benes, [name], DO [place] [facility]

## 2022-12-06 ENCOUNTER — Telehealth: Payer: Self-pay | Admitting: Family Medicine

## 2022-12-06 NOTE — Telephone Encounter (Signed)
Copied from CRM [id number]. Topic: General - Other >> Dec 06, 2022  8:07 AM Franchot Heidelberg wrote: Reason for CRM: Pt called to notify his PCP that he will be needing an additional refill of his Shawn Ware prior to his appt in July (has to wait until then for his insurance to begin). He will call when his current supply is almost out, he still has 11 left today.

## 2022-12-12 ENCOUNTER — Other Ambulatory Visit: Payer: Self-pay | Admitting: Family Medicine

## 2022-12-12 MED ORDER — ESZOPICLONE 3 MG PO TABS: ORAL_TABLET | ORAL | 0 refills | Status: DC

## 2022-12-12 NOTE — Telephone Encounter (Signed)
Medication Refill - Medication: Eszopiclone 3 MG TABS   Has the patient contacted their pharmacy? No. (Agent: If no, request that the patient contact the pharmacy for the refill. If patient does not wish to contact the pharmacy document the reason why and proceed with request.) (Agent: If yes, when and what did the pharmacy advise?)  Preferred Pharmacy (with phone number or street name):  [facility] [place], [place] - [address] Phone: [phone]  Fax: [phone]     Has the patient been seen for an appointment in the last year OR does the patient have an upcoming appointment? Yes.   12/27/22  Agent: Please be advised that RX refills may take up to 3 business days. We ask that you follow-up with your pharmacy.  Patient would like a partial refill to last until his appointment on 7/2. Please advise.

## 2022-12-27 ENCOUNTER — Ambulatory Visit: Payer: 59 | Admitting: Family Medicine

## 2022-12-28 ENCOUNTER — Encounter: Payer: Self-pay | Admitting: Physician Assistant

## 2022-12-28 ENCOUNTER — Ambulatory Visit (INDEPENDENT_AMBULATORY_CARE_PROVIDER_SITE_OTHER): Payer: 59 | Admitting: Physician Assistant

## 2022-12-28 VITALS — BP 137/77 | HR 77 | Temp 99.8°F | Ht 66.5 in | Wt 150.6 lb

## 2022-12-28 DIAGNOSIS — I10 Essential (primary) hypertension: Secondary | ICD-10-CM

## 2022-12-28 DIAGNOSIS — F321 Major depressive disorder, single episode, moderate: Secondary | ICD-10-CM

## 2022-12-28 DIAGNOSIS — N401 Enlarged prostate with lower urinary tract symptoms: Secondary | ICD-10-CM

## 2022-12-28 DIAGNOSIS — G4700 Insomnia, unspecified: Secondary | ICD-10-CM | POA: Diagnosis not present

## 2022-12-28 DIAGNOSIS — R3914 Feeling of incomplete bladder emptying: Secondary | ICD-10-CM

## 2022-12-28 DIAGNOSIS — E782 Mixed hyperlipidemia: Secondary | ICD-10-CM

## 2022-12-28 MED ORDER — ESZOPICLONE 3 MG PO TABS: 3.0000 mg | ORAL_TABLET | Freq: Every day | ORAL | 0 refills | Status: DC

## 2022-12-28 MED ORDER — FEXOFENADINE HCL 180 MG PO TABS: 180.0000 mg | ORAL_TABLET | Freq: Every day | ORAL | 2 refills | Status: DC

## 2022-12-28 MED ORDER — ATORVASTATIN CALCIUM 40 MG PO TABS: ORAL_TABLET | ORAL | 1 refills | Status: DC

## 2022-12-28 MED ORDER — CLONAZEPAM 0.5 MG PO TABS: ORAL_TABLET | ORAL | 0 refills | Status: DC

## 2022-12-28 MED ORDER — ESZOPICLONE 3 MG PO TABS: ORAL_TABLET | ORAL | 0 refills | Status: DC

## 2022-12-28 MED ORDER — FLUTICASONE PROPIONATE 50 MCG/ACT NA SUSP: 2.0000 | Freq: Every day | NASAL | 6 refills | Status: DC

## 2022-12-28 MED ORDER — TAMSULOSIN HCL 0.4 MG PO CAPS: 0.4000 mg | ORAL_CAPSULE | Freq: Every day | ORAL | 3 refills | Status: DC

## 2022-12-28 NOTE — Progress Notes (Signed)
Established Patient Office Visit  Name: Shawn Ware   MRN: [id number]    DOB: Aug 05, 1970   Date:12/30/2022  Today's Provider: Jacquelin Hawking, MHS, PA-C Introduced myself to the patient as a PA-C and provided education on APPs in clinical practice.         Subjective  Chief Complaint  Chief Complaint  Patient presents with   Hyperlipidemia   Hypertension   Insomnia    HPI  HYPERTENSION / HYPERLIPIDEMIA Satisfied with current treatment? yes Duration of hypertension: years BP monitoring frequency: a few times a day BP range: 125/73  BP medication side effects: no Past BP meds: amlodipine Duration of hyperlipidemia: years Cholesterol medication side effects: no Cholesterol supplements: none Past cholesterol medications: atorvastain (lipitor) Medication compliance: excellent compliance Aspirin: no Recent stressors: no Recurrent headaches: no Visual changes: no Palpitations: no Dyspnea: no Chest pain: no Lower extremity edema: no Dizzy/lightheaded: no  INSOMNIA Duration: years Satisfied with sleep quality: yes Difficulty falling asleep: no Difficulty staying asleep: no Waking a few hours after sleep onset: no Early morning awakenings: yes- wakes up around 2-3 Am but falls back to sleep Daytime hypersomnolence: no Wakes feeling refreshed: yes Good sleep hygiene: no- has been trying to reduce TV use in bedroom and use soothing sounds to help with sleep  Apnea: no Snoring: no Depressed/anxious mood: intermittent anxiety  Recent stress: no Restless legs/nocturnal leg cramps: no Chronic pain/arthritis: no History of sleep study: yes- several years ago  Treatments attempted: Currently using Lunesta 3 mg    Prostate concerns/ urinary issues Was prescribed  flomax to assist with BPH at previous visit  He reports he has been using the Flomax and has been feeling pretty good with this Denies urinary hesitancy or difficulty voiding today   Patient Active  Problem List   Diagnosis Date Noted   Benign prostatic hyperplasia with incomplete bladder emptying 09/03/2021   Eustachian tube dysfunction, bilateral 04/01/2020   Allergic rhinitis 04/01/2020   Back pain 05/25/2018   Right shoulder pain 05/25/2018   H. pylori infection 08/02/2017   Insomnia 07/06/2017   Depression, major, single episode, moderate (HCC) 02/17/2017   Controlled substance agreement signed 02/17/2017   ED (erectile dysfunction) 04/10/2015   Hyperlipidemia    Hypertension    Palpitations 08/23/2011    Past Surgical History:  Procedure Laterality Date   HERNIA REPAIR      Family History  Problem Relation Age of Onset   Heart attack Mother    Hypertension Mother    Hyperlipidemia Mother    Diabetes Maternal Grandmother    Hyperlipidemia Father     Social History   Tobacco Use   Smoking status: Former    Packs/day: 0.50    Years: 17.00    Additional pack years: 0.00    Total pack years: 8.50    Types: Cigarettes    Quit date: 03/26/2017    Years since quitting: 5.7   Smokeless tobacco: Never  Substance Use Topics   Alcohol use: Yes    Comment: socially     Current Outpatient Medications:    amLODipine (NORVASC) 10 MG tablet, Take 1 tablet (10 mg total) by mouth daily., Disp: 90 tablet, Rfl: 0   cyclobenzaprine (FLEXERIL) 10 MG tablet, Take 1 tablet (10 mg total) by mouth at bedtime., Disp: 30 tablet, Rfl: 0   [START ON 01/28/2023] Eszopiclone 3 MG TABS, Take 1 tablet (3 mg total) by mouth at bedtime. Take immediately before bedtime,  Disp: 30 tablet, Rfl: 0   [START ON 02/28/2023] Eszopiclone 3 MG TABS, Take 1 tablet (3 mg total) by mouth at bedtime. Take immediately before bedtime, Disp: 30 tablet, Rfl: 0   atorvastatin (LIPITOR) 40 MG tablet, TAKE 1 TABLET BY MOUTH ONCE EVERY EVENING, Disp: 90 tablet, Rfl: 1   clonazePAM (KLONOPIN) 0.5 MG tablet, TAKE 1 TABLET BY MOUTH ONCE DAILY AS NEEDED ANXIETY, Disp: 30 tablet, Rfl: 0   Eszopiclone 3 MG TABS, TAKE 1  TABLET BY MOUTH AT BEDTIME AS NEEDED. TAKE IMMEDIATELTY BEFORE BEDTIME, Disp: 30 tablet, Rfl: 0   fexofenadine (ALLEGRA ALLERGY) 180 MG tablet, Take 1 tablet (180 mg total) by mouth daily., Disp: 30 tablet, Rfl: 2   fluticasone (FLONASE) 50 MCG/ACT nasal spray, Place 2 sprays into both nostrils daily., Disp: 16 g, Rfl: 6   tamsulosin (FLOMAX) 0.4 MG CAPS capsule, Take 1 capsule (0.4 mg total) by mouth daily., Disp: 30 capsule, Rfl: 3  Allergies  Allergen Reactions   Penicillins    Ambien [Zolpidem] Palpitations   Lisinopril Rash    I personally reviewed active problem list, medication list, allergies, notes from last encounter, lab results with the patient/caregiver today.   Review of Systems  Eyes:  Negative for blurred vision and double vision.  Respiratory:  Negative for shortness of breath and wheezing.   Cardiovascular:  Negative for chest pain and palpitations.  Musculoskeletal:  Negative for falls.  Neurological:  Negative for dizziness, loss of consciousness and headaches.     Objective  Vitals:   12/28/22 0852  BP: 137/77  Pulse: 77  Temp: 99.8 F (37.7 C)  TempSrc: Oral  SpO2: 97%  Weight: 150 lb 9.6 oz (68.3 kg)  Height: 5' 6.5" (1.689 m)    Body mass index is 23.94 kg/m.  Physical Exam Vitals reviewed.  Constitutional:      General: He is awake.     Appearance: Normal appearance. He is well-developed and well-groomed.  HENT:     Head: Normocephalic and atraumatic.  Cardiovascular:     Rate and Rhythm: Normal rate and regular rhythm.     Heart sounds: Normal heart sounds.  Pulmonary:     Effort: Pulmonary effort is normal.     Breath sounds: Normal breath sounds.  Musculoskeletal:     Cervical back: Normal range of motion.  Skin:    General: Skin is warm and dry.  Neurological:     General: No focal deficit present.     Mental Status: He is alert and oriented to person, place, and time. Mental status is at baseline.  Psychiatric:        Mood  and Affect: Mood normal.        Behavior: Behavior normal. Behavior is cooperative.        Thought Content: Thought content normal.        Judgment: Judgment normal.      No results found for this or any previous visit (from the past 2160 hour(s)).   PHQ2/9:    12/28/2022    8:55 AM 08/31/2022    8:58 AM 06/06/2022    9:45 AM 03/02/2022   10:33 AM 12/06/2021    9:46 AM  Depression screen PHQ 2/9  Decreased Interest 2 2 2 3 2   Down, Depressed, Hopeless 2 2 2 1 1   PHQ - 2 Score 4 4 4 4 3   Altered sleeping 2 2 2 1 2   Tired, decreased energy 2 2 2 1 1   Change in  appetite 0 0 0 1 0  Feeling bad or failure about yourself  2 2 1  0 1  Trouble concentrating 2 2 1 1  0  Moving slowly or fidgety/restless 0 0 0 1 0  Suicidal thoughts 0 0 0 0 0  PHQ-9 Score 12 12 10 9 7   Difficult doing work/chores Somewhat difficult  Somewhat difficult Somewhat difficult Somewhat difficult      Fall Risk:    12/28/2022    8:55 AM 08/31/2022    8:58 AM 06/06/2022    9:44 AM 03/02/2022   10:33 AM 11/04/2020   10:17 AM  Fall Risk   Falls in the past year? 0 0 0 0 0  Number falls in past yr: 0 0 0 0 0  Injury with Fall? 0 0 0 0 0  Risk for fall due to : No Fall Risks No Fall Risks No Fall Risks No Fall Risks   Follow up Falls evaluation completed Falls evaluation completed Falls evaluation completed Falls evaluation completed Falls evaluation completed      Functional Status Survey:      Assessment & Plan  Problem List Items Addressed This Visit       Cardiovascular and Mediastinum   Hypertension - Primary    Chronic, historic condition Appears well-managed with current regimen comprised of amlodipine 10 mg p.o. daily Continue current regimen Will repeat labs Follow-up in 6 months or sooner if concerns arise      Relevant Medications   atorvastatin (LIPITOR) 40 MG tablet   Other Relevant Orders   Comp Met (CMET)   CBC w/Diff     Genitourinary   Benign prostatic hyperplasia with  incomplete bladder emptying    Chronic, historic condition Patient reports that he has been taking his Flomax daily and reports improvement in symptoms He denies urinary hesitancy or difficulty voiding Continue current regimen, refills provided today Follow-up in 3 months or sooner if concerns arise       Relevant Medications   tamsulosin (FLOMAX) 0.4 MG CAPS capsule   Other Relevant Orders   PSA     Other   Hyperlipidemia    Chronic, historic condition Appears well-managed with current regimen comprised of atorvastatin 40 mg p.o. daily Continue current regimen pending lab results Recheck lipid panel-orders placed today Follow-up in 6 months or sooner if concerns arise      Relevant Medications   atorvastatin (LIPITOR) 40 MG tablet   Other Relevant Orders   Lipid Profile   Depression, major, single episode, moderate (HCC)    Chronic, historic condition Patient appears to be taking Klonopin 0.5 mg p.o. daily as needed He denies concerns today for increased depression or anxiety and states that he is doing well with as needed medication Refill of 30 tablets provided today PDMP reviewed, no evidence of worrisome controlled substance use patterns at this time  Follow-up in 3 months or sooner if concerns arise      Insomnia    Chronic, historic condition Appears well-managed on Lunesta 3 mg p.o. nightly Continue current regimen, refills provided for 3 months PDMP reviewed, no evidence of worrisome controlled substance use patterns at this time  Follow-up in 3 months or sooner if concerns arise        Return in about 3 months (around 03/30/2023) for HTN, insomnia.   I, Lacrisha Bielicki E Kristyna Bradstreet, PA-C, have reviewed all documentation for this visit. The documentation on 12/30/22 for the exam, diagnosis, procedures, and orders are all accurate and complete.  Jacquelin Hawking, MHS, PA-C [facility]

## 2022-12-30 NOTE — Assessment & Plan Note (Signed)
Chronic, historic condition Patient reports that he has been taking his Flomax daily and reports improvement in symptoms He denies urinary hesitancy or difficulty voiding Continue current regimen, refills provided today Follow-up in 3 months or sooner if concerns arise

## 2022-12-30 NOTE — Assessment & Plan Note (Signed)
Chronic, historic condition Appears well-managed with current regimen comprised of amlodipine 10 mg p.o. daily Continue current regimen Will repeat labs Follow-up in 6 months or sooner if concerns arise

## 2022-12-30 NOTE — Assessment & Plan Note (Signed)
Chronic, historic condition Appears well-managed on Lunesta 3 mg p.o. nightly Continue current regimen, refills provided for 3 months PDMP reviewed, no evidence of worrisome controlled substance use patterns at this time  Follow-up in 3 months or sooner if concerns arise

## 2022-12-30 NOTE — Assessment & Plan Note (Signed)
Chronic, historic condition Patient appears to be taking Klonopin 0.5 mg p.o. daily as needed He denies concerns today for increased depression or anxiety and states that he is doing well with as needed medication Refill of 30 tablets provided today PDMP reviewed, no evidence of worrisome controlled substance use patterns at this time  Follow-up in 3 months or sooner if concerns arise

## 2022-12-30 NOTE — Assessment & Plan Note (Signed)
Chronic, historic condition Appears well-managed with current regimen comprised of atorvastatin 40 mg p.o. daily Continue current regimen pending lab results Recheck lipid panel-orders placed today Follow-up in 6 months or sooner if concerns arise

## 2023-01-03 ENCOUNTER — Other Ambulatory Visit: Payer: Self-pay | Admitting: Physician Assistant

## 2023-01-03 ENCOUNTER — Telehealth: Payer: Self-pay | Admitting: Family Medicine

## 2023-01-03 NOTE — Telephone Encounter (Signed)
Medication Refill - Medication: Eszopiclone 3 MG TABS   Pt seen Erin Mecum on 12/28/2022 for a medication refill. Per pt Dr. Laural Benes gives him 3 refills at one time. He does not have anymore refills and is needing 2 more refills.   Has the patient contacted their pharmacy? Yes.    Preferred Pharmacy (with phone number or street name): [facility] [place] [place] - [address]  Phone: [phone] Fax: [phone]  Has the patient been seen for an appointment in the last year OR does the patient have an upcoming appointment? Yes.    Agent: Please be advised that RX refills may take up to 3 business days. We ask that you follow-up with your pharmacy.

## 2023-01-03 NOTE — Telephone Encounter (Signed)
Disregard refill. Erin Mecum did the 3 refills, one for 12/28/22, 01/28/23, 02/28/23.

## 2023-01-28 ENCOUNTER — Other Ambulatory Visit: Payer: Self-pay | Admitting: Family Medicine

## 2023-01-30 NOTE — Telephone Encounter (Signed)
Requested Prescriptions  Refused Prescriptions Disp Refills   amLODipine (NORVASC) 10 MG tablet [Pharmacy Med Name: AMLODIPINE BESYLATE 10 MG TAB] 90 tablet 0    Sig: TAKE 1 TABLET BY MOUTH ONCE DAILY     Cardiovascular: Calcium Channel Blockers 2 Passed - 01/28/2023 12:22 PM      Passed - Last BP in normal range    BP Readings from Last 1 Encounters:  12/28/22 137/77         Passed - Last Heart Rate in normal range    Pulse Readings from Last 1 Encounters:  12/28/22 77         Passed - Valid encounter within last 6 months    Recent Outpatient Visits           1 month ago Primary hypertension   [place] [facility] Mecum, Oswaldo Conroy, PA-C   5 months ago Acute prostatitis   [place] [facility] [place], [place], DO   7 months ago Primary hypertension   [place] [facility] [place], [name], DO   11 months ago Acute pain of right shoulder   [place] [facility] [place], [name], DO   11 months ago Fever, unspecified fever cause   [place] [facility] Dorcas Carrow, DO       Future Appointments             In 2 months Laural Benes, [name], DO [place] [facility], [facility]

## 2023-03-29 ENCOUNTER — Other Ambulatory Visit: Payer: Self-pay | Admitting: Family Medicine

## 2023-03-29 NOTE — Telephone Encounter (Signed)
Requested medication (s) are due for refill today: yes  Requested medication (s) are on the active medication list: yes  Last refill:  12/28/22  Future visit scheduled: yes  Notes to clinic:  Unable to refill per protocol, cannot delegate.      Requested Prescriptions  Pending Prescriptions Disp Refills   Eszopiclone 3 MG TABS 30 tablet 0    Sig: TAKE 1 TABLET BY MOUTH AT BEDTIME AS NEEDED. TAKE IMMEDIATELTY BEFORE BEDTIME     Not Delegated - Psychiatry:  Anxiolytics/Hypnotics Failed - 03/29/2023  9:14 AM      Failed - This refill cannot be delegated      Failed - Urine Drug Screen completed in last 360 days      Passed - Valid encounter within last 6 months    Recent Outpatient Visits           3 months ago Primary hypertension   [place] [facility] Mecum, Oswaldo Conroy, PA-C   7 months ago Acute prostatitis   [place] [facility] [place], [place], DO   9 months ago Primary hypertension   [place] [facility] [place], [place], DO   1 year ago Acute pain of right shoulder   [place] [facility] [place], [name], DO   1 year ago Fever, unspecified fever cause   [place] [facility] Dorcas Carrow, DO       Future Appointments             In 1 month [name], [name], DO [facility] [place], [facility]

## 2023-03-29 NOTE — Telephone Encounter (Signed)
Medication Refill - Medication: Eszopiclone 3 MG TABS   Has the patient contacted their pharmacy? Yes.   (Agent: If no, request that the patient contact the pharmacy for the refill. If patient does not wish to contact the pharmacy document the reason why and proceed with request.) (Agent: If yes, when and what did the pharmacy advise?)  Preferred Pharmacy (with phone number or street name):  [facility] [place] [place] - [address]  [address] [place] [address]  Phone: [phone] Fax: [phone]   Has the patient been seen for an appointment in the last year OR does the patient have an upcoming appointment? Yes.    Agent: Please be advised that RX refills may take up to 3 business days. We ask that you follow-up with your pharmacy.

## 2023-03-31 ENCOUNTER — Other Ambulatory Visit: Payer: Self-pay | Admitting: Physician Assistant

## 2023-03-31 NOTE — Telephone Encounter (Signed)
Patient came by office to request refill on Eszopiclone 3mg  to be sent to [facility]. I am forwarding this message to the clinical staff.

## 2023-03-31 NOTE — Telephone Encounter (Signed)
Requested medication (s) are due for refill today - yes  Requested medication (s) are on the active medication list -yes  Future visit scheduled -yes  Last refill: 02/28/23 #30  Notes to clinic: non delegated Rx  Requested Prescriptions  Pending Prescriptions Disp Refills   Eszopiclone 3 MG TABS [Pharmacy Med Name: ESZOPICLONE 3 MG TAB] 30 tablet     Sig: TAKE 1 TABLET BY MOUTH AT BEDTIME. TAKE IMMEDIATELY BEFORE BEDTIME     Not Delegated - Psychiatry:  Anxiolytics/Hypnotics Failed - 03/31/2023  9:53 AM      Failed - This refill cannot be delegated      Failed - Urine Drug Screen completed in last 360 days      Passed - Valid encounter within last 6 months    Recent Outpatient Visits           3 months ago Primary hypertension   [place] [facility] Mecum, Oswaldo Conroy, PA-C   7 months ago Acute prostatitis   [place] [place], [name], DO   9 months ago Primary hypertension   [place] [facility] [place], [place], DO   1 year ago Acute pain of right shoulder   [place] [facility] [place], [name], DO   1 year ago Fever, unspecified fever cause   [place] [facility] [place], [name], DO       Future Appointments             In 4 weeks Laural Benes, [name], DO [place] [facility], [facility]               Requested Prescriptions  Pending Prescriptions Disp Refills   Eszopiclone 3 MG TABS [Pharmacy Med Name: ESZOPICLONE 3 MG TAB] 30 tablet     Sig: TAKE 1 TABLET BY MOUTH AT BEDTIME. TAKE IMMEDIATELY BEFORE BEDTIME     Not Delegated - Psychiatry:  Anxiolytics/Hypnotics Failed - 03/31/2023  9:53 AM      Failed - This refill cannot be delegated      Failed - Urine Drug Screen completed in last 360 days      Passed - Valid encounter within last 6 months    Recent Outpatient Visits           3 months ago Primary hypertension   [place] [facility] Mecum,  Oswaldo Conroy, PA-C   7 months ago Acute prostatitis   [place] [facility] [place], [place], DO   9 months ago Primary hypertension   [place] [facility] [place], [place], DO   1 year ago Acute pain of right shoulder   [place] [facility] [place], [name], DO   1 year ago Fever, unspecified fever cause   [place] [facility] Dorcas Carrow, DO       Future Appointments             In 4 weeks Laural Benes, [name], DO  [facility], [facility]

## 2023-04-03 ENCOUNTER — Telehealth: Payer: Self-pay | Admitting: Family Medicine

## 2023-04-03 MED ORDER — ESZOPICLONE 3 MG PO TABS: 3.0000 mg | ORAL_TABLET | Freq: Every day | ORAL | 0 refills | Status: DC

## 2023-04-03 NOTE — Telephone Encounter (Signed)
Pt came into the office asking to speak with Dr. Shela Commons or the nurse.  I informed him that I could send a TE he was concerned as he stated he came in here last week and the same thing was done.  However it had got denied.  He stated that he did have an appointment but he had to cancel due to the fact that he had started a new job so it was new insurance.  He stated that it does not start until the end of October and was asking if he could get enough of his Shawn Ware to last him until his upcoming appointment on 04/28/2023.  I informed him that I would follow back up with him regarding this request.  Please advise.

## 2023-04-03 NOTE — Telephone Encounter (Signed)
Pt came into the office today asking for a courtesy refill up til his appointment in November.  He stated that as of right now he does not have insurance and it goes into effect at the end of the month.  Routed to provider.

## 2023-04-03 NOTE — Telephone Encounter (Signed)
This is a controlled substance. I will get him a refill on it today, but he will need appointments for any future refills. It also cannot be refilled early

## 2023-04-03 NOTE — Telephone Encounter (Signed)
Called patient to inform and to make an appt. There was no answer I left a detailed message on machine //iap

## 2023-04-04 ENCOUNTER — Ambulatory Visit: Payer: 59 | Admitting: Family Medicine

## 2023-04-04 NOTE — Telephone Encounter (Signed)
Attempted to reach patient, LVM to disregard previous message about scheduling an appointment that was received yesterday from a different agent. Pt already had appointment scheduled before this message was sent to provider.

## 2023-04-28 ENCOUNTER — Encounter: Payer: Self-pay | Admitting: Family Medicine

## 2023-04-28 ENCOUNTER — Ambulatory Visit (INDEPENDENT_AMBULATORY_CARE_PROVIDER_SITE_OTHER): Payer: 59 | Admitting: Family Medicine

## 2023-04-28 VITALS — BP 143/79 | HR 88 | Ht 66.5 in | Wt 151.8 lb

## 2023-04-28 DIAGNOSIS — G4700 Insomnia, unspecified: Secondary | ICD-10-CM | POA: Diagnosis not present

## 2023-04-28 DIAGNOSIS — F321 Major depressive disorder, single episode, moderate: Secondary | ICD-10-CM

## 2023-04-28 DIAGNOSIS — E782 Mixed hyperlipidemia: Secondary | ICD-10-CM | POA: Diagnosis not present

## 2023-04-28 DIAGNOSIS — N401 Enlarged prostate with lower urinary tract symptoms: Secondary | ICD-10-CM | POA: Diagnosis not present

## 2023-04-28 DIAGNOSIS — I10 Essential (primary) hypertension: Secondary | ICD-10-CM | POA: Diagnosis not present

## 2023-04-28 DIAGNOSIS — Z1211 Encounter for screening for malignant neoplasm of colon: Secondary | ICD-10-CM

## 2023-04-28 DIAGNOSIS — R3914 Feeling of incomplete bladder emptying: Secondary | ICD-10-CM

## 2023-04-28 MED ORDER — AMLODIPINE BESYLATE 5 MG PO TABS: 5.0000 mg | ORAL_TABLET | Freq: Every day | ORAL | 1 refills | Status: DC

## 2023-04-28 MED ORDER — ESZOPICLONE 3 MG PO TABS: ORAL_TABLET | ORAL | 2 refills | Status: DC

## 2023-04-28 MED ORDER — FEXOFENADINE HCL 180 MG PO TABS: 180.0000 mg | ORAL_TABLET | Freq: Every day | ORAL | 3 refills | Status: DC

## 2023-04-28 MED ORDER — TAMSULOSIN HCL 0.4 MG PO CAPS: 0.4000 mg | ORAL_CAPSULE | Freq: Every day | ORAL | 3 refills | Status: DC

## 2023-04-28 NOTE — Progress Notes (Unsigned)
BP (!) 143/79   Pulse 88   Ht 5' 6.5" (1.689 m)   Wt 151 lb 12.8 oz (68.9 kg)   SpO2 98%   BMI 24.13 kg/m    Subjective:    Patient ID: Shawn Ware, male    DOB: Dec 25, 1970, 52 y.o.   MRN: [id number]  HPI: Shawn Ware is a 52 y.o. male  Chief Complaint  Patient presents with   Hypertension   Insomnia   HYPERTENSION / HYPERLIPIDEMIA- stopped his medicine about 2 weeks ago Satisfied with current treatment? {Blank single:19197::"yes","no"} Duration of hypertension: {Blank single:19197::"chronic","months","years"} BP monitoring frequency: daily BP medication side effects: {Blank single:19197::"yes","no"} Past BP meds: amlodipine Duration of hyperlipidemia: {Blank single:19197::"chronic","months","years"} Cholesterol medication side effects: {Blank single:19197::"yes","no"} Cholesterol supplements: {Blank multiple:19196::"none","fish oil","niacin","red yeast rice"} Past cholesterol medications: {Blank multiple:19196::"none","atorvastain (lipitor)","lovastatin (mevacor)","pravastatin (pravachol)","rosuvastatin (crestor)","simvastatin (zocor)","vytorin","fenofibrate (tricor)","gemfibrozil","ezetimide (zetia)","niaspan","lovaza"} Medication compliance: {Blank single:19197::"excellent compliance","good compliance","fair compliance","poor compliance"} Aspirin: {Blank single:19197::"yes","no"} Recent stressors: {Blank single:19197::"yes","no"} Recurrent headaches: {Blank single:19197::"yes","no"} Visual changes: {Blank single:19197::"yes","no"} Palpitations: {Blank single:19197::"yes","no"} Dyspnea: {Blank single:19197::"yes","no"} Chest pain: {Blank single:19197::"yes","no"} Lower extremity edema: {Blank single:19197::"yes","no"} Dizzy/lightheaded: {Blank single:19197::"yes","no"}  ANXIETY/STRESS Duration:{Blank single:19197::"controlled","uncontrolled","better","worse","exacerbated","stable"} Anxious mood: {Blank single:19197::"yes","no"}  Excessive worrying: {Blank  single:19197::"yes","no"} Irritability: {Blank single:19197::"yes","no"}  Sweating: {Blank single:19197::"yes","no"} Nausea: {Blank single:19197::"yes","no"} Palpitations:{Blank single:19197::"yes","no"} Hyperventilation: {Blank single:19197::"yes","no"} Panic attacks: {Blank single:19197::"yes","no"} Agoraphobia: {Blank single:19197::"yes","no"}  Obscessions/compulsions: {Blank single:19197::"yes","no"} Depressed mood: {Blank single:19197::"yes","no"}    04/28/2023    8:30 AM 12/28/2022    8:55 AM 08/31/2022    8:58 AM 06/06/2022    9:45 AM 03/02/2022   10:33 AM  Depression screen PHQ 2/9  Decreased Interest 1 2 2 2 3   Down, Depressed, Hopeless 1 2 2 2 1   PHQ - 2 Score 2 4 4 4 4   Altered sleeping 2 2 2 2 1   Tired, decreased energy 0 2 2 2 1   Change in appetite 0 0 0 0 1  Feeling bad or failure about yourself  1 2 2 1  0  Trouble concentrating 2 2 2 1 1   Moving slowly or fidgety/restless 0 0 0 0 1  Suicidal thoughts 0 0 0 0 0  PHQ-9 Score 7 12 12 10 9   Difficult doing work/chores  Somewhat difficult  Somewhat difficult Somewhat difficult   Anhedonia: {Blank single:19197::"yes","no"} Weight changes: {Blank single:19197::"yes","no"} Insomnia: {Blank single:19197::"yes","no"} {Blank single:19197::"hard to fall asleep","hard to stay asleep"}  Hypersomnia: {Blank single:19197::"yes","no"} Fatigue/loss of energy: {Blank single:19197::"yes","no"} Feelings of worthlessness: {Blank single:19197::"yes","no"} Feelings of guilt: {Blank single:19197::"yes","no"} Impaired concentration/indecisiveness: {Blank single:19197::"yes","no"} Suicidal ideations: {Blank single:19197::"yes","no"}  Crying spells: {Blank single:19197::"yes","no"} Recent Stressors/Life Changes: {Blank single:19197::"yes","no"}   Relationship problems: {Blank single:19197::"yes","no"}   Family stress: {Blank single:19197::"yes","no"}     Financial stress: {Blank single:19197::"yes","no"}    Job stress: {Blank  single:19197::"yes","no"}    Recent death/loss: {Blank single:19197::"yes","no"}  INSOMNIA Duration: chronic Satisfied with sleep quality: yes Difficulty falling asleep: {Blank single:19197::"yes","no"} Difficulty staying asleep: {Blank single:19197::"yes","no"} Waking a few hours after sleep onset: {Blank single:19197::"yes","no"} Early morning awakenings: {Blank single:19197::"yes","no"} Daytime hypersomnolence: {Blank single:19197::"yes","no"} Wakes feeling refreshed: {Blank single:19197::"yes","no"} Good sleep hygiene: {Blank single:19197::"yes","no"} Apnea: {Blank single:19197::"yes","no"} Snoring: {Blank single:19197::"yes","no"} Depressed/anxious mood: {Blank single:19197::"yes","no"} Recent stress: {Blank single:19197::"yes","no"} Restless legs/nocturnal leg cramps: {Blank single:19197::"yes","no"} Chronic pain/arthritis: {Blank single:19197::"yes","no"} History of sleep study: {Blank single:19197::"yes","no"} Treatments attempted: {Blank multiple:19196::"none","melatonin","uinsom","benadryl","ambien"}   Relevant past medical, surgical, family and social history reviewed and updated as indicated. Interim medical history since our last visit reviewed. Allergies and medications reviewed and updated.  Review of Systems  Constitutional: Negative.   Respiratory: Negative.    Cardiovascular: Negative.   Gastrointestinal:  Negative.   Musculoskeletal: Negative.   Psychiatric/Behavioral: Negative.      Per HPI unless specifically indicated above     Objective:    BP (!) 143/79   Pulse 88   Ht 5' 6.5" (1.689 m)   Wt 151 lb 12.8 oz (68.9 kg)   SpO2 98%   BMI 24.13 kg/m   Wt Readings from Last 3 Encounters:  04/28/23 151 lb 12.8 oz (68.9 kg)  12/28/22 150 lb 9.6 oz (68.3 kg)  08/31/22 155 lb 14.4 oz (70.7 kg)    Physical Exam Vitals and nursing note reviewed.  Constitutional:      General: He is not in acute distress.    Appearance: Normal appearance. He is not  ill-appearing, toxic-appearing or diaphoretic.  HENT:     Head: Normocephalic and atraumatic.     Right Ear: External ear normal.     Left Ear: External ear normal.     Nose: Nose normal.     Mouth/Throat:     Mouth: Mucous membranes are moist.     Pharynx: Oropharynx is clear.  Eyes:     General: No scleral icterus.       Right eye: No discharge.        Left eye: No discharge.     Extraocular Movements: Extraocular movements intact.     Conjunctiva/sclera: Conjunctivae normal.     Pupils: Pupils are equal, round, and reactive to light.  Cardiovascular:     Rate and Rhythm: Normal rate and regular rhythm.     Pulses: Normal pulses.     Heart sounds: Normal heart sounds. No murmur heard.    No friction rub. No gallop.  Pulmonary:     Effort: Pulmonary effort is normal. No respiratory distress.     Breath sounds: Normal breath sounds. No stridor. No wheezing, rhonchi or rales.  Chest:     Chest wall: No tenderness.  Musculoskeletal:        General: Normal range of motion.     Cervical back: Normal range of motion and neck supple.  Skin:    General: Skin is warm and dry.     Capillary Refill: Capillary refill takes less than 2 seconds.     Coloration: Skin is not jaundiced or pale.     Findings: No bruising, erythema, lesion or rash.  Neurological:     General: No focal deficit present.     Mental Status: He is alert and oriented to person, place, and time. Mental status is at baseline.  Psychiatric:        Mood and Affect: Mood normal.        Behavior: Behavior normal.        Thought Content: Thought content normal.        Judgment: Judgment normal.     Results for orders placed or performed in visit on 08/31/22  Microscopic Examination   Urine  Result Value Ref Range   WBC, UA 11-30 (A) 0 - 5 /hpf   RBC, Urine 0-2 0 - 2 /hpf   Epithelial Cells (non renal) 0-10 0 - 10 /hpf   Mucus, UA Present (A) Not Estab.   Bacteria, UA None seen None seen/Few  PSA  Result Value  Ref Range   Prostate Specific Ag, Serum 2.6 0.0 - 4.0 ng/mL  Urinalysis, Routine w reflex microscopic  Result Value Ref Range   Specific Gravity, UA 1.020 1.005 - 1.030   pH, UA 5.5 5.0 - 7.5   Color,  UA Yellow Yellow   Appearance Ur Cloudy (A) Clear   Leukocytes,UA 1+ (A) Negative   Protein,UA Trace (A) Negative/Trace   Glucose, UA Negative Negative   Ketones, UA Negative Negative   RBC, UA Negative Negative   Bilirubin, UA Negative Negative   Urobilinogen, Ur 0.2 0.2 - 1.0 mg/dL   Nitrite, UA Negative Negative   Microscopic Examination See below:   Microalbumin, Urine Waived  Result Value Ref Range   Microalb, Ur Waived 80 (H) 0 - 19 mg/L   Creatinine, Urine Waived 200 10 - 300 mg/dL   Microalb/Creat Ratio 30-300 (H) <30 mg/g      Assessment & Plan:   Problem List Items Addressed This Visit   None    Follow up plan: No follow-ups on file.

## 2023-04-28 NOTE — Progress Notes (Signed)
Called and scheduled patient for labs on 05/02/2023 @ 8:20 am and a physical in 3 months on 08/02/2023 @ 9:20 am.

## 2023-04-30 NOTE — Assessment & Plan Note (Signed)
Running high, but had been running low on full dose. Will cut his amlodipine to 5mg  and recheck in 3 months. Call with any concerns.

## 2023-04-30 NOTE — Assessment & Plan Note (Signed)
Under good control on current regimen. Continue current regimen. Continue to monitor. Call with any concerns. Refills given for 3 months. Follow up in 3 months.   

## 2023-04-30 NOTE — Assessment & Plan Note (Signed)
Under good control on current regimen. Continue current regimen. Continue to monitor. Call with any concerns. Refills given. Labs drawn today.   

## 2023-04-30 NOTE — Assessment & Plan Note (Signed)
Stable. Using klonopin occasionally. Continue to monitor.

## 2023-04-30 NOTE — Assessment & Plan Note (Signed)
Will check labs today. Continue flomax. Call with any concerns.

## 2023-05-11 ENCOUNTER — Ambulatory Visit: Payer: Self-pay | Admitting: *Deleted

## 2023-05-11 NOTE — Telephone Encounter (Signed)
Summary: would like medication increased back to 10mg    amLODipine (NORVASC) 5 MG tablet    Patient stated he would like for his Rx to change back to 10 mg.      Called patient [phone] to review medication dose increase request for norvasc from 5 mg to 10 mg. No answer, LVMTCB [phone].

## 2023-05-11 NOTE — Telephone Encounter (Signed)
Patient needs to come back for blood work he didn't do before we can adjust anything, then OK for a virtual visit.

## 2023-05-11 NOTE — Telephone Encounter (Signed)
Chief Complaint: Want to change amlodipine to 10 mg from 5 mg  Symptoms: None, BP a little up 138/81 after taking 10 mg Frequency: N/A Pertinent Negatives: Patient denies symptoms Disposition: [] ED /[] Urgent Care (no appt availability in office) / [] Appointment(In office/virtual)/ []  [place] [facility]/ [] Home Care/ [] Refused Recommended Disposition /[] [place] [facility]/ [x]  Follow-up with PCP Additional Notes: Patient says he's been on amlodipine 10 mg for several years and he wants to continue taking 10 mg. He says he's been taking 2 of the 5 mg and he's going to be out of it soon. He would like new Rx to [facility] in [place]. Advised I will send this to Dr. Laural Benes and someone will call with her recommendation.   Reason for Disposition  [1] Caller has NON-URGENT medicine question about med that PCP prescribed AND [2] triager unable to answer question  Answer Assessment - Initial Assessment Questions 1. NAME of MEDICINE: "What medicine(s) are you calling about?"     Amlodipine 2. QUESTION: "What is your question?" (e.g., double dose of medicine, side effect)     Want to keep the dose at 10 mg 3. PRESCRIBER: "Who prescribed the medicine?" Reason: if prescribed by specialist, call should be referred to that group.     Dr. Laural Benes 4. SYMPTOMS: "Do you have any symptoms?" If Yes, ask: "What symptoms are you having?"  "How bad are the symptoms (e.g., mild, moderate, severe)     Nothing except BP a little elevated  Protocols used: Medication Question Call-A-AH

## 2023-05-11 NOTE — Telephone Encounter (Signed)
Called and scheduled patient for labs on 05/15/2023 @ 8:40 am and for a MyChart Video Visit on 05/17/2023 @ 10:40 am.

## 2023-05-12 ENCOUNTER — Other Ambulatory Visit: Payer: 59

## 2023-05-15 ENCOUNTER — Other Ambulatory Visit: Payer: 59

## 2023-05-17 ENCOUNTER — Other Ambulatory Visit: Payer: Self-pay | Admitting: Family Medicine

## 2023-05-17 ENCOUNTER — Telehealth: Payer: 59 | Admitting: Family Medicine

## 2023-05-17 DIAGNOSIS — N401 Enlarged prostate with lower urinary tract symptoms: Secondary | ICD-10-CM

## 2023-05-17 DIAGNOSIS — I10 Essential (primary) hypertension: Secondary | ICD-10-CM

## 2023-05-17 DIAGNOSIS — E782 Mixed hyperlipidemia: Secondary | ICD-10-CM

## 2023-05-18 ENCOUNTER — Other Ambulatory Visit: Payer: 59

## 2023-05-18 ENCOUNTER — Telehealth: Payer: 59 | Admitting: Family Medicine

## 2023-05-18 DIAGNOSIS — I10 Essential (primary) hypertension: Secondary | ICD-10-CM

## 2023-05-18 DIAGNOSIS — E782 Mixed hyperlipidemia: Secondary | ICD-10-CM

## 2023-05-18 DIAGNOSIS — N401 Enlarged prostate with lower urinary tract symptoms: Secondary | ICD-10-CM

## 2023-05-19 ENCOUNTER — Other Ambulatory Visit: Payer: 59

## 2023-05-19 LAB — COMPREHENSIVE METABOLIC PANEL
ALT: 35 [IU]/L (ref 0–44)
AST: 29 [IU]/L (ref 0–40)
Albumin: 4.7 g/dL (ref 4.1–5.1)
Alkaline Phosphatase: 72 [IU]/L (ref 44–121)
BUN/Creatinine Ratio: 17 (ref 9–20)
BUN: 15 mg/dL (ref 6–24)
Bilirubin Total: 0.4 mg/dL (ref 0.0–1.2)
CO2: 24 mmol/L (ref 20–29)
Calcium: 9.4 mg/dL (ref 8.7–10.2)
Chloride: 101 mmol/L (ref 96–106)
Creatinine, Ser: 0.88 mg/dL (ref 0.76–1.27)
Globulin, Total: 2.4 g/dL (ref 1.5–4.5)
Glucose: 100 mg/dL — ABNORMAL HIGH (ref 70–99)
Potassium: 4.2 mmol/L (ref 3.5–5.2)
Sodium: 139 mmol/L (ref 134–144)
Total Protein: 7.1 g/dL (ref 6.0–8.5)
eGFR: 105 mL/min/{1.73_m2} (ref >59)

## 2023-05-19 LAB — LIPID PANEL W/O CHOL/HDL RATIO
Cholesterol, Total: 206 mg/dL — ABNORMAL HIGH (ref 100–199)
HDL: 70 mg/dL (ref >39)
LDL Chol Calc (NIH): 114 mg/dL — ABNORMAL HIGH (ref 0–99)
Triglycerides: 127 mg/dL (ref 0–149)
VLDL Cholesterol Cal: 22 mg/dL (ref 5–40)

## 2023-05-19 LAB — CBC WITH DIFFERENTIAL/PLATELET
Basophils Absolute: 0.1 10*3/uL (ref 0.0–0.2)
Basos: 1 %
EOS (ABSOLUTE): 0 10*3/uL (ref 0.0–0.4)
Eos: 0 %
Hematocrit: 47.8 % (ref 37.5–51.0)
Hemoglobin: 15.2 g/dL (ref 13.0–17.7)
Immature Grans (Abs): 0 10*3/uL (ref 0.0–0.1)
Immature Granulocytes: 0 %
Lymphocytes Absolute: 1.9 10*3/uL (ref 0.7–3.1)
Lymphs: 23 %
MCH: 28.3 pg (ref 26.6–33.0)
MCHC: 31.8 g/dL (ref 31.5–35.7)
MCV: 89 fL (ref 79–97)
Monocytes Absolute: 0.6 10*3/uL (ref 0.1–0.9)
Monocytes: 8 %
Neutrophils Absolute: 5.4 10*3/uL (ref 1.4–7.0)
Neutrophils: 68 %
Platelets: 304 10*3/uL (ref 150–450)
RBC: 5.37 x10E6/uL (ref 4.14–5.80)
RDW: 12.7 % (ref 11.6–15.4)
WBC: 8 10*3/uL (ref 3.4–10.8)

## 2023-05-19 LAB — PSA: Prostate Specific Ag, Serum: 2 ng/mL (ref 0.0–4.0)

## 2023-05-23 ENCOUNTER — Telehealth (INDEPENDENT_AMBULATORY_CARE_PROVIDER_SITE_OTHER): Payer: 59 | Admitting: Family Medicine

## 2023-05-23 ENCOUNTER — Encounter: Payer: Self-pay | Admitting: Family Medicine

## 2023-05-23 VITALS — BP 133/83

## 2023-05-23 DIAGNOSIS — Z87891 Personal history of nicotine dependence: Secondary | ICD-10-CM

## 2023-05-23 DIAGNOSIS — I10 Essential (primary) hypertension: Secondary | ICD-10-CM | POA: Diagnosis not present

## 2023-05-23 MED ORDER — AMLODIPINE BESYLATE 10 MG PO TABS: 10.0000 mg | ORAL_TABLET | Freq: Every day | ORAL | 1 refills | Status: DC

## 2023-05-23 NOTE — Progress Notes (Signed)
BP 133/83 (BP Location: Left Arm, Patient Position: Sitting, Cuff Size: Normal)    Subjective:    Patient ID: Shawn Ware, male    DOB: [birth year], 52 y.o.   MRN: [id number]  HPI: Shawn Ware is a 52 y.o. male  Chief Complaint  Patient presents with   Hypertension    Going ok.    HYPERTENSION- has been taking 2 of his amlodipine, unsure why he said that he was running low on the full dose. Feeling well on the 10  Hypertension status:  controlled Satisfied with current treatment? yes Duration of hypertension: chronic BP monitoring frequency:  a few times a week BP medication side effects:  no Medication compliance: excellent compliance Previous BP meds: amlodipine Aspirin: no Recurrent headaches: no Visual changes: no Palpitations: no Dyspnea: no Chest pain: no Lower extremity edema: no Dizzy/lightheaded: no  Relevant past medical, surgical, family and social history reviewed and updated as indicated. Interim medical history since our last visit reviewed. Allergies and medications reviewed and updated.  Review of Systems  Constitutional: Negative.   Respiratory: Negative.    Cardiovascular: Negative.   Musculoskeletal: Negative.   Neurological: Negative.   Psychiatric/Behavioral: Negative.      Per HPI unless specifically indicated above     Objective:    BP 133/83 (BP Location: Left Arm, Patient Position: Sitting, Cuff Size: Normal)   Wt Readings from Last 3 Encounters:  04/28/23 151 lb 12.8 oz (68.9 kg)  12/28/22 150 lb 9.6 oz (68.3 kg)  08/31/22 155 lb 14.4 oz (70.7 kg)    Physical Exam Vitals and nursing note reviewed.  Constitutional:      General: He is not in acute distress.    Appearance: Normal appearance. He is not ill-appearing, toxic-appearing or diaphoretic.  HENT:     Head: Normocephalic and atraumatic.     Right Ear: External ear normal.     Left Ear: External ear normal.     Nose: Nose normal.     Mouth/Throat:     Mouth: Mucous  membranes are moist.     Pharynx: Oropharynx is clear.  Eyes:     General: No scleral icterus.       Right eye: No discharge.        Left eye: No discharge.     Conjunctiva/sclera: Conjunctivae normal.     Pupils: Pupils are equal, round, and reactive to light.  Pulmonary:     Effort: Pulmonary effort is normal. No respiratory distress.     Comments: Speaking in full sentences Musculoskeletal:        General: Normal range of motion.     Cervical back: Normal range of motion.  Skin:    Coloration: Skin is not jaundiced or pale.     Findings: No bruising, erythema, lesion or rash.  Neurological:     Mental Status: He is alert and oriented to person, place, and time. Mental status is at baseline.  Psychiatric:        Mood and Affect: Mood normal.        Behavior: Behavior normal.        Thought Content: Thought content normal.        Judgment: Judgment normal.     Results for orders placed or performed in visit on 05/18/23  PSA  Result Value Ref Range   Prostate Specific Ag, Serum 2.0 0.0 - 4.0 ng/mL  Lipid Panel w/o Chol/HDL Ratio  Result Value Ref Range   Cholesterol, Total 206 (H) 100 -  199 mg/dL   Triglycerides 295 0 - 149 mg/dL   HDL 70 >28 mg/dL   VLDL Cholesterol Cal 22 5 - 40 mg/dL   LDL Chol Calc (NIH) 413 (H) 0 - 99 mg/dL  Comprehensive metabolic panel  Result Value Ref Range   Glucose 100 (H) 70 - 99 mg/dL   BUN 15 6 - 24 mg/dL   Creatinine, Ser 2.44 0.76 - 1.27 mg/dL   eGFR 010 >27 OZ/DGU/4.40   BUN/Creatinine Ratio 17 9 - 20   Sodium 139 134 - 144 mmol/L   Potassium 4.2 3.5 - 5.2 mmol/L   Chloride 101 96 - 106 mmol/L   CO2 24 20 - 29 mmol/L   Calcium 9.4 8.7 - 10.2 mg/dL   Total Protein 7.1 6.0 - 8.5 g/dL   Albumin 4.7 4.1 - 5.1 g/dL   Globulin, Total 2.4 1.5 - 4.5 g/dL   Bilirubin Total 0.4 0.0 - 1.2 mg/dL   Alkaline Phosphatase 72 44 - 121 IU/L   AST 29 0 - 40 IU/L   ALT 35 0 - 44 IU/L  CBC with Differential/Platelet  Result Value Ref Range   WBC  8.0 3.4 - 10.8 x10E3/uL   RBC 5.37 4.14 - 5.80 x10E6/uL   Hemoglobin 15.2 13.0 - 17.7 g/dL   Hematocrit 34.7 42.5 - 51.0 %   MCV 89 79 - 97 fL   MCH 28.3 26.6 - 33.0 pg   MCHC 31.8 31.5 - 35.7 g/dL   RDW 95.6 38.7 - 56.4 %   Platelets 304 150 - 450 x10E3/uL   Neutrophils 68 Not Estab. %   Lymphs 23 Not Estab. %   Monocytes 8 Not Estab. %   Eos 0 Not Estab. %   Basos 1 Not Estab. %   Neutrophils Absolute 5.4 1.4 - 7.0 x10E3/uL   Lymphocytes Absolute 1.9 0.7 - 3.1 x10E3/uL   Monocytes Absolute 0.6 0.1 - 0.9 x10E3/uL   EOS (ABSOLUTE) 0.0 0.0 - 0.4 x10E3/uL   Basophils Absolute 0.1 0.0 - 0.2 x10E3/uL   Immature Granulocytes 0 Not Estab. %   Immature Grans (Abs) 0.0 0.0 - 0.1 x10E3/uL      Assessment & Plan:   Problem List Items Addressed This Visit       Cardiovascular and Mediastinum   Hypertension - Primary    Has been taking 10mg  of his amlodipine and doing well with it. Will restart 10 mg. Follow up as scheduled. Call with any concerns.       Relevant Medications   amLODipine (NORVASC) 10 MG tablet     Follow up plan: Return As scheduled.   This visit was completed via video visit through MyChart due to the restrictions of the COVID-19 pandemic. All issues as above were discussed and addressed. Physical exam was done as above through visual confirmation on video through MyChart. If it was felt that the patient should be evaluated in the office, they were directed there. The patient verbally consented to this visit. Location of the patient: home Location of the provider: work Those involved with this call:  Provider: Olevia Perches, DO CMA:  Arnetha Gula, CMA Front Desk/Registration:  Servando Snare   Time spent on call:  15 minutes with patient face to face via video conference. More than 50% of this time was spent in counseling and coordination of care. 23 minutes total spent in review of patient's record and preparation of their chart.

## 2023-05-23 NOTE — Assessment & Plan Note (Signed)
Has been taking 10mg  of his amlodipine and doing well with it. Will restart 10 mg. Follow up as scheduled. Call with any concerns.

## 2023-06-02 ENCOUNTER — Other Ambulatory Visit: Payer: Self-pay | Admitting: Family Medicine

## 2023-06-02 ENCOUNTER — Other Ambulatory Visit: Payer: 59

## 2023-06-02 ENCOUNTER — Telehealth: Payer: 59 | Admitting: Family Medicine

## 2023-06-02 DIAGNOSIS — I10 Essential (primary) hypertension: Secondary | ICD-10-CM

## 2023-06-02 LAB — MICROALBUMIN, URINE WAIVED
Creatinine, Urine Waived: 200 mg/dL (ref 10–300)
Microalb, Ur Waived: 80 mg/L — ABNORMAL HIGH (ref 0–19)

## 2023-06-24 ENCOUNTER — Other Ambulatory Visit: Payer: Self-pay | Admitting: Family Medicine

## 2023-06-29 NOTE — Telephone Encounter (Signed)
RX expired

## 2023-06-29 NOTE — Telephone Encounter (Signed)
 Requested medication (s) are due for refill today: Due 07/29/23  Requested medication (s) are on the active medication list: yes    Last refill: 04/27/24   #30  2 refills  Future visit scheduled yes 08/02/23  Notes to clinic:Not delegated, please review.  Requested Prescriptions  Pending Prescriptions Disp Refills   Eszopiclone  3 MG TABS [Pharmacy Med Name: ESZOPICLONE  3 MG TAB] 30 tablet     Sig: TAKE 1 TABLET BY MOUTH AT BEDTIME AS NEEDED. TAKE IMMEDIATELTY BEFORE BEDTIME     Not Delegated - Psychiatry:  Anxiolytics/Hypnotics Failed - 06/29/2023 11:57 AM      Failed - This refill cannot be delegated      Failed - Urine Drug Screen completed in last 360 days      Passed - Valid encounter within last 6 months    Recent Outpatient Visits           1 month ago Primary hypertension   [place] [facility] [place], [name], DO   2 months ago Primary hypertension   [place] [facility] [place], [place], DO   6 months ago Primary hypertension   [place] [facility] Mecum, Erin E, PA-C   10 months ago Acute prostatitis   [place] [facility] [place], [place], DO   1 year ago Primary hypertension    [facility] [place], Duwaine SQUIBB, DO       Future Appointments             In 1 month [name], Duwaine SQUIBB, DO  [facility]

## 2023-07-19 ENCOUNTER — Telehealth: Payer: Self-pay | Admitting: Family Medicine

## 2023-07-19 ENCOUNTER — Other Ambulatory Visit: Payer: Self-pay | Admitting: Family Medicine

## 2023-07-19 NOTE — Telephone Encounter (Signed)
Requested medication (s) are due for refill today: review  Requested medication (s) are on the active medication list: yes  Last refill:  04/28/23 #30/2  Future visit scheduled: yes  Notes to clinic:  Unable to refill per protocol, cannot delegate.      Requested Prescriptions  Pending Prescriptions Disp Refills   Eszopiclone 3 MG TABS 30 tablet 2    Sig: TAKE 1 TABLET BY MOUTH AT BEDTIME AS NEEDED. TAKE IMMEDIATELTY BEFORE BEDTIME     Not Delegated - Psychiatry:  Anxiolytics/Hypnotics Failed - 07/19/2023  3:18 PM      Failed - This refill cannot be delegated      Failed - Urine Drug Screen completed in last 360 days      Passed - Valid encounter within last 6 months    Recent Outpatient Visits           1 month ago Primary hypertension   [place] [facility] [place], [name], DO   2 months ago Primary hypertension   [place] [facility] [place], [place], DO   6 months ago Primary hypertension   [place] [facility] Mecum, Erin E, PA-C   10 months ago Acute prostatitis   [place] [facility] [place], [place], DO   1 year ago Primary hypertension   [place] [facility] [place], [name], DO       Future Appointments             In 2 weeks Laural Benes, [name], DO [place] [facility], [facility]

## 2023-07-19 NOTE — Telephone Encounter (Signed)
Medication Refill -  Most Recent Primary Care Visit:  Provider: [facility]  Department: [facility]  Visit Type: LAB  Date: 06/02/2023  Medication: Eszopiclone 3 MG TABS [[id number]]   Has the patient contacted their pharmacy? Yes  (Agent: If yes, when and what did the pharmacy advise?) Contact PCP   Is this the correct pharmacy for this prescription? Yes  This is the patient's preferred pharmacy:  [facility] - [place], [place] - [address] [place] [address] Phone: [phone] Fax: [phone]    Has the prescription been filled recently? Yes  Is the patient out of the medication? No  Has the patient been seen for an appointment in the last year OR does the patient have an upcoming appointment? Yes  Can we respond through MyChart? Yes  Agent: Please be advised that Rx refills may take up to 3 business days. We ask that you follow-up with your pharmacy.

## 2023-07-19 NOTE — Telephone Encounter (Signed)
Requested medication (s) are due for refill today: yes  Requested medication (s) are on the active medication list: yes  Last refill:  04/28/23 #30/2  Future visit scheduled: yes  Notes to clinic:  Unable to refill per protocol, cannot delegate.     Requested Prescriptions  Pending Prescriptions Disp Refills   Eszopiclone 3 MG TABS [Pharmacy Med Name: ESZOPICLONE 3 MG TAB] 30 tablet     Sig: TAKE 1 TABLET BY MOUTH AT BEDTIME AS NEEDED. TAKE IMMEDIATELTY BEFORE BEDTIME     Not Delegated - Psychiatry:  Anxiolytics/Hypnotics Failed - 07/19/2023  1:02 PM      Failed - This refill cannot be delegated      Failed - Urine Drug Screen completed in last 360 days      Passed - Valid encounter within last 6 months    Recent Outpatient Visits           1 month ago Primary hypertension   [place] [facility] [place], [name], DO   2 months ago Primary hypertension   [place] [facility] [place], [place], DO   6 months ago Primary hypertension   [place] [facility] Mecum, Erin E, PA-C   10 months ago Acute prostatitis   [place] [facility] [place], [place], DO   1 year ago Primary hypertension   [place] [facility] [place], [name], DO       Future Appointments             In 2 weeks Laural Benes, [name], DO [place] [facility]

## 2023-07-20 NOTE — Telephone Encounter (Signed)
Patient called to follow up on his prescription for Eszopiclone 3 MG. He states that the pharmacy dose not have any refills on file. Request was routed to provider for refill 07/19/23. Please Advise.

## 2023-07-20 NOTE — Addendum Note (Signed)
Addended by: Nena Polio on: 07/20/2023 11:18 AM   Modules accepted: Orders

## 2023-07-21 ENCOUNTER — Telehealth: Payer: Self-pay | Admitting: Family Medicine

## 2023-07-21 NOTE — Telephone Encounter (Signed)
Pt is calling in requesting an update on his refill request for Eszopiclone 3 MG TABS [[id number]] , pt states that he has called numerous times and hasn't heard anything back. Pt is going out of town next week and wanted to make sure this situation was taken care of beforehand. Pt is requesting someone call him today regarding this matter.

## 2023-07-21 NOTE — Telephone Encounter (Signed)
Appointment has been made

## 2023-07-21 NOTE — Telephone Encounter (Signed)
Pt said he is going out of town Tuesday and he only has 3 pills left.

## 2023-07-22 ENCOUNTER — Other Ambulatory Visit: Payer: Self-pay | Admitting: Family Medicine

## 2023-07-24 ENCOUNTER — Encounter: Payer: Self-pay | Admitting: Family Medicine

## 2023-07-24 ENCOUNTER — Ambulatory Visit: Payer: 59 | Admitting: Family Medicine

## 2023-07-24 VITALS — BP 111/71 | HR 87 | Temp 98.8°F | Wt 152.6 lb

## 2023-07-24 DIAGNOSIS — F321 Major depressive disorder, single episode, moderate: Secondary | ICD-10-CM

## 2023-07-24 DIAGNOSIS — I10 Essential (primary) hypertension: Secondary | ICD-10-CM

## 2023-07-24 DIAGNOSIS — G4700 Insomnia, unspecified: Secondary | ICD-10-CM | POA: Diagnosis not present

## 2023-07-24 MED ORDER — ESZOPICLONE 3 MG PO TABS: ORAL_TABLET | ORAL | 2 refills | Status: DC

## 2023-07-24 MED ORDER — CLONAZEPAM 0.5 MG PO TABS: ORAL_TABLET | ORAL | 0 refills | Status: DC

## 2023-07-24 NOTE — Progress Notes (Signed)
BP 111/71   Pulse 87   Temp 98.8 F (37.1 C) (Oral)   Wt 152 lb 9.6 oz (69.2 kg)   SpO2 96%   BMI 24.26 kg/m    Subjective:    Patient ID: Shawn Ware, male    DOB: 1971/04/22, 53 y.o.   MRN: [id number]  HPI: Shawn Ware is a 53 y.o. male  Chief Complaint  Patient presents with   Hypertension   HYPERTENSION  Hypertension status: controlled  Satisfied with current treatment? yes Duration of hypertension: chronic BP monitoring frequency:  rarely BP range:  BP medication side effects:  no Medication compliance: excellent compliance Previous BP meds:amlodipine Aspirin: no Recurrent headaches: no Visual changes: no Palpitations: no Dyspnea: no Chest pain: no Lower extremity edema: no Dizzy/lightheaded: no  INSOMNIA Duration: chronic Satisfied with sleep quality: yes Difficulty falling asleep: no Difficulty staying asleep: no Waking a few hours after sleep onset: no Early morning awakenings: no Daytime hypersomnolence: no Wakes feeling refreshed: no Good sleep hygiene: no Apnea: no Snoring: no Depressed/anxious mood: yes Recent stress: no Restless legs/nocturnal leg cramps: no Chronic pain/arthritis: no History of sleep study: no Treatments attempted:  lunesta, melatonin, uinsom, benadryl, and ambien   ANXIETY/DEPRESSION Duration: chronic Status:stable Anxious mood: yes  Excessive worrying: yes Irritability: yes  Sweating: no Nausea: no Palpitations:no Hyperventilation: no Panic attacks: no Agoraphobia: no  Obscessions/compulsions: no Depressed mood: yes    07/24/2023    9:20 AM 05/23/2023    9:14 AM 04/28/2023    8:30 AM 12/28/2022    8:55 AM 08/31/2022    8:58 AM  Depression screen PHQ 2/9  Decreased Interest 2 2 1 2 2   Down, Depressed, Hopeless 2 1 1 2 2   PHQ - 2 Score 4 3 2 4 4   Altered sleeping 2 1 2 2 2   Tired, decreased energy 1 0 0 2 2  Change in appetite 0 0 0 0 0  Feeling bad or failure about yourself  0 1 1 2 2   Trouble  concentrating 1 1 2 2 2   Moving slowly or fidgety/restless 0 0 0 0 0  Suicidal thoughts 0 0 0 0 0  PHQ-9 Score 8 6 7 12 12   Difficult doing work/chores  Somewhat difficult  Somewhat difficult       07/24/2023    9:20 AM 05/23/2023    9:15 AM 04/28/2023    8:29 AM 12/28/2022    8:55 AM  GAD 7 : Generalized Anxiety Score  Nervous, Anxious, on Edge 2 1 1 3   Control/stop worrying 2 1 2 2   Worry too much - different things 2 1 2 2   Trouble relaxing 2 1 2 3   Restless 2 0 2 2  Easily annoyed or irritable 1 1 1 2   Afraid - awful might happen 1 1 1 2   Total GAD 7 Score 12 6 11 16   Anxiety Difficulty  Not difficult at all     Anhedonia: no Weight changes: no Insomnia: yes hard to fall asleep  Hypersomnia: no Fatigue/loss of energy: yes Feelings of worthlessness: yes Feelings of guilt: yes Impaired concentration/indecisiveness: yes Suicidal ideations: no  Crying spells: yes Recent Stressors/Life Changes: no   Relationship problems: no   Family stress: no     Financial stress: no    Job stress: no    Recent death/loss: no  Relevant past medical, surgical, family and social history reviewed and updated as indicated. Interim medical history since our last visit reviewed. Allergies and medications  reviewed and updated.  Review of Systems  Constitutional: Negative.   Respiratory: Negative.    Cardiovascular: Negative.   Musculoskeletal: Negative.   Psychiatric/Behavioral: Negative.      Per HPI unless specifically indicated above     Objective:    BP 111/71   Pulse 87   Temp 98.8 F (37.1 C) (Oral)   Wt 152 lb 9.6 oz (69.2 kg)   SpO2 96%   BMI 24.26 kg/m   Wt Readings from Last 3 Encounters:  07/24/23 152 lb 9.6 oz (69.2 kg)  04/28/23 151 lb 12.8 oz (68.9 kg)  12/28/22 150 lb 9.6 oz (68.3 kg)    Physical Exam Vitals and nursing note reviewed.  Constitutional:      General: He is not in acute distress.    Appearance: Normal appearance. He is not ill-appearing,  toxic-appearing or diaphoretic.  HENT:     Head: Normocephalic and atraumatic.     Right Ear: External ear normal.     Left Ear: External ear normal.     Nose: Nose normal.     Mouth/Throat:     Mouth: Mucous membranes are moist.     Pharynx: Oropharynx is clear.  Eyes:     General: No scleral icterus.       Right eye: No discharge.        Left eye: No discharge.     Extraocular Movements: Extraocular movements intact.     Conjunctiva/sclera: Conjunctivae normal.     Pupils: Pupils are equal, round, and reactive to light.  Cardiovascular:     Rate and Rhythm: Normal rate and regular rhythm.     Pulses: Normal pulses.     Heart sounds: Normal heart sounds. No murmur heard.    No friction rub. No gallop.  Pulmonary:     Effort: Pulmonary effort is normal. No respiratory distress.     Breath sounds: Normal breath sounds. No stridor. No wheezing, rhonchi or rales.  Chest:     Chest wall: No tenderness.  Musculoskeletal:        General: Normal range of motion.     Cervical back: Normal range of motion and neck supple.  Skin:    General: Skin is warm and dry.     Capillary Refill: Capillary refill takes less than 2 seconds.     Coloration: Skin is not jaundiced or pale.     Findings: No bruising, erythema, lesion or rash.  Neurological:     General: No focal deficit present.     Mental Status: He is alert and oriented to person, place, and time. Mental status is at baseline.  Psychiatric:        Mood and Affect: Mood normal.        Behavior: Behavior normal.        Thought Content: Thought content normal.        Judgment: Judgment normal.     Results for orders placed or performed in visit on 06/02/23  Microalbumin, Urine Waived   Collection Time: 06/02/23  8:41 AM  Result Value Ref Range   Microalb, Ur Waived 80 (H) 0 - 19 mg/L   Creatinine, Urine Waived 200 10 - 300 mg/dL   Microalb/Creat Ratio 30-300 (H) <30 mg/g      Assessment & Plan:   Problem List Items  Addressed This Visit       Cardiovascular and Mediastinum   Hypertension - Primary   Under good control on current regimen. Continue current regimen. Continue  to monitor. Call with any concerns. Refills up to date. Labs drawn today.         Relevant Orders   Basic metabolic panel     Other   Depression, major, single episode, moderate (HCC)   Not on any preventative medicine. Refills of klonopin given today. Rx should last 3-6 months.       Insomnia   Under good control on current regimen. Continue current regimen. Continue to monitor. Call with any concerns. Refills given for 3 months. Follow up 3 months.          Follow up plan: Return in about 3 months (around 10/22/2023) for before 4/27 please for physical.

## 2023-07-24 NOTE — Assessment & Plan Note (Signed)
Under good control on current regimen. Continue current regimen. Continue to monitor. Call with any concerns. Refills up to date. Labs drawn today.

## 2023-07-24 NOTE — Assessment & Plan Note (Signed)
Not on any preventative medicine. Refills of klonopin given today. Rx should last 3-6 months.

## 2023-07-24 NOTE — Assessment & Plan Note (Signed)
Under good control on current regimen. Continue current regimen. Continue to monitor. Call with any concerns. Refills given for 3 months. Follow up 3 months.

## 2023-07-25 ENCOUNTER — Encounter: Payer: Self-pay | Admitting: Family Medicine

## 2023-07-25 LAB — BASIC METABOLIC PANEL
BUN/Creatinine Ratio: 9 (ref 9–20)
BUN: 8 mg/dL (ref 6–24)
CO2: 22 mmol/L (ref 20–29)
Calcium: 8.6 mg/dL — ABNORMAL LOW (ref 8.7–10.2)
Chloride: 102 mmol/L (ref 96–106)
Creatinine, Ser: 0.86 mg/dL (ref 0.76–1.27)
Glucose: 106 mg/dL — ABNORMAL HIGH (ref 70–99)
Potassium: 3.7 mmol/L (ref 3.5–5.2)
Sodium: 141 mmol/L (ref 134–144)
eGFR: 105 mL/min/{1.73_m2} (ref >59)

## 2023-07-28 ENCOUNTER — Telehealth: Payer: BC Managed Care – PPO | Admitting: Nurse Practitioner

## 2023-08-02 ENCOUNTER — Encounter: Payer: 59 | Admitting: Family Medicine

## 2023-08-23 ENCOUNTER — Ambulatory Visit: Payer: Self-pay | Admitting: Family Medicine

## 2023-08-23 NOTE — Telephone Encounter (Signed)
 Copied from CRM [id number]. Topic: Clinical - Lab/Test Results >> Aug 23, 2023  7:57 AM Tiffany B wrote: Reason for CRM: Patient has additional questions regarding his most recent labs.   Chief Complaint: PCP Call No Triage Symptoms: None Frequency: N/A Pertinent Negatives: Patient denies symptoms Disposition: [] ED /[] Urgent Care (no appt availability in office) / [] Appointment(In office/virtual)/ []  [facility]/ [] Home Care/ [] Refused Recommended Disposition /[]  Mobile Bus/ [x]  Follow-up with PCP Additional Notes: CO called in regarding questions alluding to his recent lab results. The patient asked specifically about his blood glucose levels and were they in range. Provided education to the patient at this time and explained his lab results were in normal range. Patient verbalized understanding.   Reason for Disposition  [1] Other NON-URGENT information for PCP AND [2] does not require PCP response    Requested additional information regarding normal blood glucose range, triager able to answer question, no triage or PCP call required.  Answer Assessment - Initial Assessment Questions 1. REASON FOR CALL or QUESTION: "What is your reason for calling today?" or "How can I best help you?" or "What question do you have that I can help answer?"     Lab Result, Blood Glucose  2. CALLER: Document the source of call. (e.g., laboratory, patient).     Shawn Ware, Patient  Protocols used: PCP Call - No Triage-A-AH

## 2023-09-18 ENCOUNTER — Other Ambulatory Visit: Payer: Self-pay | Admitting: Family Medicine

## 2023-09-18 NOTE — Telephone Encounter (Signed)
 Requested medication (s) are due for refill today: Yes  Requested medication (s) are on the active medication list: Yes  Last refill:  04/03/23  Future visit scheduled: Yes  Notes to clinic:  Unable to refill per protocol, cannot delegate.      Requested Prescriptions  Pending Prescriptions Disp Refills   Eszopiclone 3 MG TABS [Pharmacy Med Name: ESZOPICLONE 3 MG TAB] 30 tablet     Sig: TAKE 1 TABLET BY MOUTH AT BEDTIME AS NEEDED. TAKE IMMEDIATELY BEFORE BEDTIME     Not Delegated - Psychiatry:  Anxiolytics/Hypnotics Failed - 09/18/2023  4:57 PM      Failed - This refill cannot be delegated      Failed - Urine Drug Screen completed in last 360 days      Passed - Valid encounter within last 6 months    Recent Outpatient Visits           1 month ago Primary hypertension   [facility], [name], DO   3 months ago Primary hypertension   [facility] [place], [place], DO   4 months ago Primary hypertension   [place] [facility] [place], [place], DO   8 months ago Primary hypertension   [place] [facility] Mecum, Oswaldo Conroy, PA-C   1 year ago Acute prostatitis   [facility] [place], [name], DO       Future Appointments             In 1 month [name], [name], DO [place] [facility]

## 2023-09-19 MED ORDER — ESZOPICLONE 3 MG PO TABS: ORAL_TABLET | ORAL | 0 refills | Status: DC

## 2023-09-19 NOTE — Telephone Encounter (Signed)
 Rx with 2 additional refills sent in on 07/24/23- should not be due until April- can we confirm this with the pharmacy?

## 2023-09-19 NOTE — Telephone Encounter (Signed)
 RX has an end date on their of 08/23/23. Pharmacy will not fill the refills if they are after that end date. Once the end date comes, the prescription is no longer active. They would need a new prescription.

## 2023-09-19 NOTE — Addendum Note (Signed)
 Addended by: Dorcas Carrow on: 09/19/2023 08:22 AM   Modules accepted: Orders

## 2023-09-20 ENCOUNTER — Encounter: Payer: Self-pay | Admitting: Nurse Practitioner

## 2023-09-20 ENCOUNTER — Ambulatory Visit (INDEPENDENT_AMBULATORY_CARE_PROVIDER_SITE_OTHER): Payer: Self-pay | Admitting: Nurse Practitioner

## 2023-09-20 VITALS — BP 117/81 | HR 102 | Temp 99.5°F | Ht 66.5 in | Wt 149.0 lb

## 2023-09-20 DIAGNOSIS — R509 Fever, unspecified: Secondary | ICD-10-CM

## 2023-09-20 DIAGNOSIS — J101 Influenza due to other identified influenza virus with other respiratory manifestations: Secondary | ICD-10-CM

## 2023-09-20 LAB — VERITOR FLU A/B WAIVED
Influenza A: POSITIVE — AB
Influenza B: NEGATIVE

## 2023-09-20 MED ORDER — OSELTAMIVIR PHOSPHATE 75 MG PO CAPS: 75.0000 mg | ORAL_CAPSULE | Freq: Two times a day (BID) | ORAL | 0 refills | Status: DC

## 2023-09-20 MED ORDER — HYDROCOD POLI-CHLORPHE POLI ER 10-8 MG/5ML PO SUER: 5.0000 mL | Freq: Every evening | ORAL | 0 refills | Status: DC | PRN

## 2023-09-20 NOTE — Progress Notes (Signed)
 BP 117/81 (BP Location: Right Arm, Patient Position: Sitting, Cuff Size: Large)   Pulse (!) 102   Temp 99.5 F (37.5 C) (Oral)   Ht 5' 6.5" (1.689 m)   Wt 149 lb (67.6 kg)   SpO2 96%   BMI 23.69 kg/m    Subjective:    Patient ID: Shawn Ware, male    DOB: [birth year], 53 y.o.   MRN: [id number]  HPI: Shawn Ware is a 53 y.o. male  Chief Complaint  Patient presents with   Fever   Diarrhea   Emesis   Nasal Congestion   Cough    All symptoms started on monday   UPPER RESPIRATORY TRACT INFECTION Worst symptom: symptoms started on Monday afternoon Fever: yes -101-102/yesterday Cough: yes Shortness of breath: no Wheezing: no Chest pain: no Chest tightness: no Chest congestion: yes Nasal congestion: yes Runny nose: yes Post nasal drip: yes Sneezing: yes Sore throat: no Swollen glands: no Sinus pressure: no Headache: no Face pain: no Toothache: no Ear pain: no bilateral Ear pressure: no bilateral Eyes red/itching:no Eye drainage/crusting: no  Vomiting: yes Rash: no Fatigue: yes Sick contacts: no Strep contacts: no  Context: better Recurrent sinusitis: no Relief with OTC cold/cough medications: yes  Treatments attempted: mucinex   Relevant past medical, surgical, family and social history reviewed and updated as indicated. Interim medical history since our last visit reviewed. Allergies and medications reviewed and updated.  Review of Systems  Constitutional:  Positive for fatigue and fever.  HENT:  Positive for congestion, postnasal drip, rhinorrhea and sneezing. Negative for ear pain, sinus pressure, sinus pain and sore throat.   Respiratory:  Positive for cough. Negative for chest tightness, shortness of breath and wheezing.   Gastrointestinal:  Negative for vomiting.  Skin:  Negative for rash.  Neurological:  Negative for headaches.    Per HPI unless specifically indicated above     Objective:    BP 117/81 (BP Location: Right Arm, Patient  Position: Sitting, Cuff Size: Large)   Pulse (!) 102   Temp 99.5 F (37.5 C) (Oral)   Ht 5' 6.5" (1.689 m)   Wt 149 lb (67.6 kg)   SpO2 96%   BMI 23.69 kg/m   Wt Readings from Last 3 Encounters:  09/20/23 149 lb (67.6 kg)  07/24/23 152 lb 9.6 oz (69.2 kg)  04/28/23 151 lb 12.8 oz (68.9 kg)    Physical Exam Vitals and nursing note reviewed.  Constitutional:      General: He is not in acute distress.    Appearance: Normal appearance. He is not ill-appearing, toxic-appearing or diaphoretic.  HENT:     Head: Normocephalic.     Right Ear: Tympanic membrane and external ear normal.     Left Ear: Tympanic membrane and external ear normal.     Nose: Congestion and rhinorrhea present.     Mouth/Throat:     Mouth: Mucous membranes are moist.     Pharynx: Posterior oropharyngeal erythema present. No oropharyngeal exudate.  Eyes:     General:        Right eye: No discharge.        Left eye: No discharge.     Extraocular Movements: Extraocular movements intact.     Conjunctiva/sclera: Conjunctivae normal.     Pupils: Pupils are equal, round, and reactive to light.  Cardiovascular:     Rate and Rhythm: Normal rate and regular rhythm.     Heart sounds: No murmur heard. Pulmonary:     Effort:  Pulmonary effort is normal. No respiratory distress.     Breath sounds: Normal breath sounds. No wheezing, rhonchi or rales.  Abdominal:     General: Abdomen is flat. Bowel sounds are normal.  Musculoskeletal:     Cervical back: Normal range of motion and neck supple.  Skin:    General: Skin is warm and dry.     Capillary Refill: Capillary refill takes less than 2 seconds.  Neurological:     General: No focal deficit present.     Mental Status: He is alert and oriented to person, place, and time.  Psychiatric:        Mood and Affect: Mood normal.        Behavior: Behavior normal.        Thought Content: Thought content normal.        Judgment: Judgment normal.     Results for orders  placed or performed in visit on 07/24/23  Basic metabolic panel   Collection Time: 07/24/23  9:20 AM  Result Value Ref Range   Glucose 106 (H) 70 - 99 mg/dL   BUN 8 6 - 24 mg/dL   Creatinine, Ser 8.29 0.76 - 1.27 mg/dL   eGFR 562 >13 YQ/MVH/8.46   BUN/Creatinine Ratio 9 9 - 20   Sodium 141 134 - 144 mmol/L   Potassium 3.7 3.5 - 5.2 mmol/L   Chloride 102 96 - 106 mmol/L   CO2 22 20 - 29 mmol/L   Calcium 8.6 (L) 8.7 - 10.2 mg/dL      Assessment & Plan:   Problem List Items Addressed This Visit   None Visit Diagnoses       Influenza A    -  Primary   Will treat with tamiflu and tussinex.  Educated patient on course of illness.  Follow up if symptoms not improved.   Relevant Medications   oseltamivir (TAMIFLU) 75 MG capsule     Fever, unspecified fever cause       Relevant Orders   Veritor Flu A/B Waived        Follow up plan: No follow-ups on file.

## 2023-09-22 ENCOUNTER — Other Ambulatory Visit: Payer: Self-pay | Admitting: Family Medicine

## 2023-09-22 NOTE — Telephone Encounter (Signed)
 Copied from CRM [id number]. Topic: Clinical - Medication Refill >> Sep 22, 2023  8:15 AM Izetta Dakin wrote: Most Recent Primary Care Visit:  Provider: Larae Grooms  Department: [facility]  Visit Type: ACUTE  Date: 09/20/2023  Medication: Eszopiclone 3 MG TABS  Has the patient contacted their pharmacy? Yes (Agent: If no, request that the patient contact the pharmacy for the refill. If patient does not wish to contact the pharmacy document the reason why and proceed with request.) (Agent: If yes, when and what did the pharmacy advise?)  Is this the correct pharmacy for this prescription? Yes If no, delete pharmacy and type the correct one.  This is the patient's preferred pharmacy:  [facility] - [place], [place] - [address] [place] [address] Phone: [phone] Fax: [phone]    Has the prescription been filled recently? No  Is the patient out of the medication? Yes  Has the patient been seen for an appointment in the last year OR does the patient have an upcoming appointment? Yes  Can we respond through MyChart? Yes  Agent: Please be advised that Rx refills may take up to 3 business days. We ask that you follow-up with your pharmacy.

## 2023-09-25 NOTE — Telephone Encounter (Signed)
 Refilled 09/19/23. Requested Prescriptions  Refused Prescriptions Disp Refills   Eszopiclone 3 MG TABS 30 tablet 0    Sig: TAKE 1 TABLET BY MOUTH AT BEDTIME AS NEEDED. TAKE IMMEDIATELTY BEFORE BEDTIME     There is no refill protocol information for this order

## 2023-10-17 ENCOUNTER — Other Ambulatory Visit: Payer: Self-pay | Admitting: Family Medicine

## 2023-10-18 NOTE — Telephone Encounter (Signed)
 Requested medication (s) are due for refill today- yes  Requested medication (s) are on the active medication list -yes  Future visit scheduled -yes  Last refill: 09/19/23 #30  Notes to clinic: non delegated Rx  Requested Prescriptions  Pending Prescriptions Disp Refills   Eszopiclone  3 MG TABS [Pharmacy Med Name: ESZOPICLONE  3 MG TAB] 30 tablet     Sig: TAKE 1 TABLET BY MOUTH AT BEDTIME AS NEEDED. TAKE IMMEDIATELY BEFORE BEDTIME     Not Delegated - Psychiatry:  Anxiolytics/Hypnotics Failed - 10/18/2023  8:54 AM      Failed - This refill cannot be delegated      Failed - Urine Drug Screen completed in last 360 days      Failed - Valid encounter within last 6 months    Recent Outpatient Visits           4 weeks ago Influenza A   [place] [facility] Aileen Alexanders, NP       Future Appointments             In 1 week Lincoln Renshaw, Jerilee Montane, DO [place] [facility], [facility]               Requested Prescriptions  Pending Prescriptions Disp Refills   Eszopiclone  3 MG TABS [Pharmacy Med Name: ESZOPICLONE  3 MG TAB] 30 tablet     Sig: TAKE 1 TABLET BY MOUTH AT BEDTIME AS NEEDED. TAKE IMMEDIATELY BEFORE BEDTIME     Not Delegated - Psychiatry:  Anxiolytics/Hypnotics Failed - 10/18/2023  8:54 AM      Failed - This refill cannot be delegated      Failed - Urine Drug Screen completed in last 360 days      Failed - Valid encounter within last 6 months    Recent Outpatient Visits           4 weeks ago Influenza A   [place] [facility] Aileen Alexanders, NP       Future Appointments             In 1 week Lincoln Renshaw, Jerilee Montane, DO [place] [facility], [facility]

## 2023-10-18 NOTE — Telephone Encounter (Signed)
Patient needs refill before upcoming appointment.

## 2023-10-25 ENCOUNTER — Encounter: Payer: 59 | Admitting: Family Medicine

## 2023-10-27 ENCOUNTER — Encounter: Payer: Self-pay | Admitting: Nurse Practitioner

## 2023-10-27 ENCOUNTER — Telehealth: Payer: Self-pay | Admitting: Nurse Practitioner

## 2023-10-27 VITALS — BP 118/78

## 2023-10-27 DIAGNOSIS — E785 Hyperlipidemia, unspecified: Secondary | ICD-10-CM

## 2023-10-27 DIAGNOSIS — G4700 Insomnia, unspecified: Secondary | ICD-10-CM

## 2023-10-27 DIAGNOSIS — I10 Essential (primary) hypertension: Secondary | ICD-10-CM

## 2023-10-27 DIAGNOSIS — E782 Mixed hyperlipidemia: Secondary | ICD-10-CM

## 2023-10-27 DIAGNOSIS — Z1211 Encounter for screening for malignant neoplasm of colon: Secondary | ICD-10-CM

## 2023-10-27 MED ORDER — ATORVASTATIN CALCIUM 40 MG PO TABS: ORAL_TABLET | ORAL | 0 refills | Status: DC

## 2023-10-27 MED ORDER — ESZOPICLONE 3 MG PO TABS: ORAL_TABLET | ORAL | 2 refills | Status: DC

## 2023-10-27 MED ORDER — AMLODIPINE BESYLATE 10 MG PO TABS: 10.0000 mg | ORAL_TABLET | Freq: Every day | ORAL | 0 refills | Status: DC

## 2023-10-27 MED ORDER — CYCLOBENZAPRINE HCL 10 MG PO TABS: 10.0000 mg | ORAL_TABLET | Freq: Every day | ORAL | 0 refills | Status: DC

## 2023-10-27 NOTE — Assessment & Plan Note (Signed)
 Under good control on current regimen. Continue current regimen. Continue to monitor. Call with any concerns. Refills up to date. Labs due at next visit.

## 2023-10-27 NOTE — Assessment & Plan Note (Signed)
 Chronic.  Controlled.  Continue with current medication regimen. Needs updated labs at next visit.  Return to clinic in 3 months for reevaluation.  Call sooner if concerns arise.

## 2023-10-27 NOTE — Progress Notes (Signed)
 BP 118/78 (BP Location: Left Arm, Patient Position: Sitting, Cuff Size: Normal)    Subjective:    Patient ID: Shawn Ware, male    DOB: 06-18-1971, 53 y.o.   MRN: [id number]  HPI: Shawn Ware is a 53 y.o. male  Chief Complaint  Patient presents with   Medication Refill    Pended what he is needed refills.    INSOMNIA Duration: chronic Satisfied with sleep quality: yes Difficulty falling asleep: no Difficulty staying asleep: no Waking a few hours after sleep onset: no Early morning awakenings: no Daytime hypersomnolence: no Wakes feeling refreshed: no Good sleep hygiene: no Apnea: no Snoring: no Depressed/anxious mood: yes Recent stress: no Restless legs/nocturnal leg cramps: no Chronic pain/arthritis: no History of sleep study: no Treatments attempted: lunesta , melatonin, uinsom, benadryl, and ambien    HYPERTENSION  Not having any medication side effects.  Hypertension status: controlled  Satisfied with current treatment? yes Duration of hypertension: chronic BP monitoring frequency:  rarely BP range:  BP medication side effects:  no Medication compliance: excellent compliance Previous BP meds:amlodipine  Aspirin: no Recurrent headaches: no Visual changes: no Palpitations: no Dyspnea: no Chest pain: no Lower extremity edema: no Dizzy/lightheaded: no  Relevant past medical, surgical, family and social history reviewed and updated as indicated. Interim medical history since our last visit reviewed. Allergies and medications reviewed and updated.  Review of Systems  Eyes:  Negative for visual disturbance.  Respiratory:  Negative for shortness of breath.   Cardiovascular:  Negative for chest pain and leg swelling.  Neurological:  Negative for light-headedness and headaches.  Psychiatric/Behavioral:  Positive for sleep disturbance.     Per HPI unless specifically indicated above     Objective:    BP 118/78 (BP Location: Left Arm, Patient Position:  Sitting, Cuff Size: Normal)   Wt Readings from Last 3 Encounters:  09/20/23 149 lb (67.6 kg)  07/24/23 152 lb 9.6 oz (69.2 kg)  04/28/23 151 lb 12.8 oz (68.9 kg)    Physical Exam Vitals and nursing note reviewed.  Constitutional:      General: He is not in acute distress.    Appearance: He is not ill-appearing.  HENT:     Head: Normocephalic.     Right Ear: Hearing normal.     Left Ear: Hearing normal.     Nose: Nose normal.  Pulmonary:     Effort: Pulmonary effort is normal. No respiratory distress.  Neurological:     Mental Status: He is alert.  Psychiatric:        Mood and Affect: Mood normal.        Behavior: Behavior normal.        Thought Content: Thought content normal.        Judgment: Judgment normal.     Results for orders placed or performed in visit on 09/20/23  Veritor Flu A/B Waived   Collection Time: 09/20/23  2:23 PM  Result Value Ref Range   Influenza A Positive (A) Negative   Influenza B Negative Negative      Assessment & Plan:   Problem List Items Addressed This Visit       Cardiovascular and Mediastinum   Hypertension   Under good control on current regimen. Continue current regimen. Continue to monitor. Call with any concerns. Refills up to date. Labs due at next visit.      Relevant Medications   amLODipine  (NORVASC ) 10 MG tablet   atorvastatin  (LIPITOR) 40 MG tablet     Other  Hyperlipidemia   Chronic.  Controlled.  Continue with current medication regimen. Needs updated labs at next visit.  Return to clinic in 3 months for reevaluation.  Call sooner if concerns arise.        Relevant Medications   amLODipine  (NORVASC ) 10 MG tablet   atorvastatin  (LIPITOR) 40 MG tablet   Insomnia - Primary   Under good control on current regimen. Continue current regimen. Continue to monitor. Call with any concerns. Refills given for 3 month.  PDMP checked.      Other Visit Diagnoses       Screening for colon cancer       Relevant Orders    Cologuard        Follow up plan: Return in about 3 months (around 01/27/2024) for Follow up with Dr. Lincoln Renshaw- should be in person.    This visit was completed via MyChart due to the restrictions of the COVID-19 pandemic. All issues as above were discussed and addressed. Physical exam was done as above through visual confirmation on MyChart. If it was felt that the patient should be evaluated in the office, they were directed there. The patient verbally consented to this visit. Location of the patient: Home Location of the provider: Office Those involved with this call:  Provider: Aileen Alexanders, NP CMA: Althia Jetty, CMA Front Desk/Registration: Jaynee Meyer This encounter was conducted via video.  I spent 20 mins dedicated to the care of this patient on the date of this encounter to include previsit review of plan of care, follow up and refills, face to face time with the patient, and post visit ordering of testing.

## 2023-10-27 NOTE — Assessment & Plan Note (Addendum)
 Under good control on current regimen. Continue current regimen. Continue to monitor. Call with any concerns. Refills given for 3 month.  PDMP checked.

## 2024-02-02 ENCOUNTER — Other Ambulatory Visit: Payer: Self-pay | Admitting: Nurse Practitioner

## 2024-02-06 NOTE — Telephone Encounter (Signed)
 Requested medication (s) are due for refill today: yes  Requested medication (s) are on the active medication list: yes  Last refill:  10/27/23 #30 2 RF  Future visit scheduled: yes  Notes to clinic:  med not delegated to NT to RF   Requested Prescriptions  Pending Prescriptions Disp Refills   Eszopiclone  3 MG TABS [Pharmacy Med Name: ESZOPICLONE  3 MG TAB] 30 tablet     Sig: TAKE 1 TABLET BY MOUTH AT BEDTIME AS NEEDED. TAKE IMMEDIATELY BEFORE BEDTIME     Not Delegated - Psychiatry:  Anxiolytics/Hypnotics Failed - 02/06/2024 12:31 PM      Failed - This refill cannot be delegated      Failed - Urine Drug Screen completed in last 360 days      Passed - Valid encounter within last 6 months    Recent Outpatient Visits           3 months ago Insomnia, unspecified type   [place] [facility] Melvin Pao, NP   4 months ago Influenza A   [place] [facility] Melvin Pao, NP

## 2024-02-09 ENCOUNTER — Other Ambulatory Visit: Payer: Self-pay | Admitting: Family Medicine

## 2024-02-09 ENCOUNTER — Ambulatory Visit: Payer: Self-pay | Admitting: Family Medicine

## 2024-02-12 NOTE — Telephone Encounter (Signed)
 Requested medications are due for refill today.  yes  Requested medications are on the active medications list.  yes  Last refill. 07/24/2023 #30 0 rf  Future visit scheduled.   yes  Notes to clinic.  Refill not delegated.    Requested Prescriptions  Pending Prescriptions Disp Refills   clonazePAM  (KLONOPIN ) 0.5 MG tablet [Pharmacy Med Name: CLONAZEPAM  0.5 MG TAB] 30 tablet     Sig: TAKE 1 TABLET BY MOUTH ONCE DAILY AS NEEDED FOR ANXIETY     Not Delegated - Psychiatry: Anxiolytics/Hypnotics 2 Failed - 02/12/2024  5:13 PM      Failed - This refill cannot be delegated      Failed - Urine Drug Screen completed in last 360 days      Passed - Patient is not pregnant      Passed - Valid encounter within last 6 months    Recent Outpatient Visits           3 months ago Insomnia, unspecified type   [place] [facility] Melvin Pao, NP   4 months ago Influenza A   [place] [facility] Melvin Pao, NP

## 2024-02-13 ENCOUNTER — Telehealth: Payer: Self-pay | Admitting: Family Medicine

## 2024-02-13 NOTE — Telephone Encounter (Signed)
 Called patient and left a message for him to call back to get scheduled for an earlier appt.

## 2024-02-13 NOTE — Telephone Encounter (Signed)
 Overdue for appt. Needs a sooner appointment. OK to use same day

## 2024-02-14 NOTE — Telephone Encounter (Signed)
 Scheduled

## 2024-02-27 ENCOUNTER — Ambulatory Visit: Payer: Self-pay | Admitting: Family Medicine

## 2024-02-28 ENCOUNTER — Ambulatory Visit: Payer: Self-pay | Admitting: Family Medicine

## 2024-02-28 ENCOUNTER — Telehealth: Payer: Self-pay | Admitting: Family Medicine

## 2024-02-28 ENCOUNTER — Encounter: Payer: Self-pay | Admitting: Family Medicine

## 2024-02-28 VITALS — BP 129/78 | Ht 66.5 in | Wt 145.0 lb

## 2024-02-28 DIAGNOSIS — G4700 Insomnia, unspecified: Secondary | ICD-10-CM

## 2024-02-28 DIAGNOSIS — F321 Major depressive disorder, single episode, moderate: Secondary | ICD-10-CM

## 2024-02-28 DIAGNOSIS — E782 Mixed hyperlipidemia: Secondary | ICD-10-CM

## 2024-02-28 DIAGNOSIS — I10 Essential (primary) hypertension: Secondary | ICD-10-CM

## 2024-02-28 MED ORDER — ATORVASTATIN CALCIUM 40 MG PO TABS: ORAL_TABLET | ORAL | 0 refills | Status: DC

## 2024-02-28 MED ORDER — AMLODIPINE BESYLATE 10 MG PO TABS: 10.0000 mg | ORAL_TABLET | Freq: Every day | ORAL | 0 refills | Status: DC

## 2024-02-28 MED ORDER — ESZOPICLONE 3 MG PO TABS: ORAL_TABLET | ORAL | 2 refills | Status: DC

## 2024-02-28 MED ORDER — CLONAZEPAM 0.5 MG PO TABS: ORAL_TABLET | ORAL | 0 refills | Status: DC

## 2024-02-28 NOTE — Progress Notes (Signed)
 Called left vm for patient to call office and schedule appt

## 2024-02-28 NOTE — Progress Notes (Signed)
 Called patient and left a message for him to call back to get scheduled for a 3 month follow up.

## 2024-02-28 NOTE — Progress Notes (Unsigned)
 BP 129/78   Ht 5' 6.5 (1.689 m)   Wt 145 lb (65.8 kg)   BMI 23.05 kg/m    Subjective:    Patient ID: Shawn Ware, male    DOB: 1970-10-05, 53 y.o.   MRN: [id number]  HPI: Shawn Ware is a 53 y.o. male  Chief Complaint  Patient presents with  . Medication Refill   HYPERTENSION / HYPERLIPIDEMIA Satisfied with current treatment? {Blank single:19197::yes,no} Duration of hypertension: {Blank single:19197::chronic,months,years} BP monitoring frequency: {Blank single:19197::not checking,rarely,daily,weekly,monthly,a few times a day,a few times a week,a few times a month} BP range:  BP medication side effects: {Blank single:19197::yes,no} Past BP meds: {Blank multiple:19196::none,amlodipine ,amlodipine /benazepril,atenolol,benazepril,benazepril/HCTZ,bisoprolol (bystolic),carvedilol,chlorthalidone,clonidine,diltiazem,exforge HCT,HCTZ,irbesartan (avapro),labetalol,lisinopril ,lisinopril -HCTZ,losartan  (cozaar ),methyldopa,nifedipine,olmesartan (benicar),olmesartan-HCTZ,quinapril,ramipril,spironalactone,tekturna,valsartan,valsartan-HCTZ,verapamil} Duration of hyperlipidemia: {Blank single:19197::chronic,months,years} Cholesterol medication side effects: {Blank single:19197::yes,no} Cholesterol supplements: {Blank multiple:19196::none,fish oil,niacin,red yeast rice} Past cholesterol medications: {Blank multiple:19196::none,atorvastain (lipitor),lovastatin (mevacor),pravastatin (pravachol),rosuvastatin (crestor),simvastatin (zocor),vytorin,fenofibrate (tricor),gemfibrozil,ezetimide (zetia),niaspan,lovaza} Medication compliance: {Blank single:19197::excellent compliance,good compliance,fair compliance,poor compliance} Aspirin: {Blank single:19197::yes,no} Recent stressors: {Blank single:19197::yes,no} Recurrent headaches: {Blank  single:19197::yes,no} Visual changes: {Blank single:19197::yes,no} Palpitations: {Blank single:19197::yes,no} Dyspnea: {Blank single:19197::yes,no} Chest pain: {Blank single:19197::yes,no} Lower extremity edema: {Blank single:19197::yes,no} Dizzy/lightheaded: {Blank single:19197::yes,no}  INSOMNIA Duration: {Blank single:19197::chronic,months,years} Satisfied with sleep quality: {Blank single:19197::yes,no} Difficulty falling asleep: {Blank single:19197::yes,no} Difficulty staying asleep: {Blank single:19197::yes,no} Waking a few hours after sleep onset: {Blank single:19197::yes,no} Early morning awakenings: {Blank single:19197::yes,no} Daytime hypersomnolence: {Blank single:19197::yes,no} Wakes feeling refreshed: {Blank single:19197::yes,no} Good sleep hygiene: {Blank single:19197::yes,no} Apnea: {Blank single:19197::yes,no} Snoring: {Blank single:19197::yes,no} Depressed/anxious mood: {Blank single:19197::yes,no} Recent stress: {Blank single:19197::yes,no} Restless legs/nocturnal leg cramps: {Blank single:19197::yes,no} Chronic pain/arthritis: {Blank single:19197::yes,no} History of sleep study: {Blank single:19197::yes,no} Treatments attempted: {Blank multiple:19196::none,melatonin,uinsom,benadryl,ambien }  ANXIETY/STRESS- hasn't used his klonopin  in about 2 months Duration:{Blank single:19197::controlled,uncontrolled,better,worse,exacerbated,stable} Anxious mood: {Blank single:19197::yes,no}  Excessive worrying: {Blank single:19197::yes,no} Irritability: {Blank single:19197::yes,no}  Sweating: {Blank single:19197::yes,no} Nausea: {Blank single:19197::yes,no} Palpitations:{Blank single:19197::yes,no} Hyperventilation: {Blank single:19197::yes,no} Panic attacks: {Blank single:19197::yes,no} Agoraphobia: {Blank  single:19197::yes,no}  Obscessions/compulsions: {Blank single:19197::yes,no} Depressed mood: {Blank single:19197::yes,no}    10/27/2023   10:20 AM 09/20/2023    2:24 PM 07/24/2023    9:20 AM 05/23/2023    9:14 AM 04/28/2023    8:30 AM  Depression screen PHQ 2/9  Decreased Interest 1 2 2 2 1   Down, Depressed, Hopeless 1 1 2 1 1   PHQ - 2 Score 2 3 4 3 2   Altered sleeping 2 2 2 1 2   Tired, decreased energy 0 0 1 0 0  Change in appetite 0 0 0 0 0  Feeling bad or failure about yourself  0 1 0 1 1  Trouble concentrating 1 1 1 1 2   Moving slowly or fidgety/restless 0 0 0 0 0  Suicidal thoughts 0 0 0 0 0  PHQ-9 Score 5 7 8 6 7   Difficult doing work/chores Not difficult at all   Somewhat difficult    Anhedonia: {Blank single:19197::yes,no} Weight changes: {Blank single:19197::yes,no} Insomnia: {Blank single:19197::yes,no} {Blank single:19197::hard to fall asleep,hard to stay asleep}  Hypersomnia: {Blank single:19197::yes,no} Fatigue/loss of energy: {Blank single:19197::yes,no} Feelings of worthlessness: {Blank single:19197::yes,no} Feelings of guilt: {Blank single:19197::yes,no} Impaired concentration/indecisiveness: {Blank single:19197::yes,no} Suicidal ideations: {Blank single:19197::yes,no}  Crying spells: {Blank single:19197::yes,no} Recent Stressors/Life Changes: {Blank single:19197::yes,no}   Relationship problems: {Blank single:19197::yes,no}   Family stress: {Blank single:19197::yes,no}     Financial stress: {Blank single:19197::yes,no}    Job stress: {Blank single:19197::yes,no}    Recent death/loss: {Blank single:19197::yes,no}  Relevant past medical, surgical, family and social history reviewed and updated as indicated. Interim medical history since our last visit reviewed. Allergies and medications reviewed and updated.  Review of Systems  Per  HPI unless specifically indicated above      Objective:    BP 129/78   Ht 5' 6.5 (1.689 m)   Wt 145 lb (65.8 kg)   BMI 23.05 kg/m   Wt Readings from Last 3 Encounters:  02/28/24 145 lb (65.8 kg)  09/20/23 149 lb (67.6 kg)  07/24/23 152 lb 9.6 oz (69.2 kg)    Physical Exam  Results for orders placed or performed in visit on 09/20/23  Veritor Flu A/B Waived   Collection Time: 09/20/23  2:23 PM  Result Value Ref Range   Influenza A Positive (A) Negative   Influenza B Negative Negative      Assessment & Plan:   Problem List Items Addressed This Visit   None    Follow up plan: No follow-ups on file.   {Blank single:19197::This visit was completed via telephone due to the restrictions of the COVID-19 pandemic. All issues as above were discussed and addressed but no physical exam was performed. If it was felt that the patient should be evaluated in the office, they were directed there. The patient verbally consented to this visit. Patient was unable to complete an audio/visual visit due to {Blank single:19197::Technical difficulties", "Lack of internet,Lack of equipment,***}. Due to the catastrophic nature of the COVID-19 pandemic, this visit was done through audio contact only.,This visit was completed via video visit through {Blank single:19197::MyChart,FaceTime,Skype,Doximity,***} due to the restrictions of the COVID-19 pandemic. All issues as above were discussed and addressed. Physical exam was done as above through visual confirmation on video through {Blank single:19197::MyChart,FaceTime,Skype,Doximity,***}. If it was felt that the patient should be evaluated in the office, they were directed there. The patient verbally consented to this visit.} Location of the patient: {Blank single:19197::home,work,parking lot,***} Location of the provider: {Blank single:19197::home,work} Those involved with this call:  Provider: {Blank single:19197::Jolene Remlap, DNP,Mael Delap Vicci ROSALEA Darice Melvin SHIRLENE Hadassah Herold, MD} CMA: {Blank single:19197::Katie Lumpkins, CMA,Brittany Nelwyn, CMA,Mehoyia Layne, CMA,,Miranda Corona, CMA} Front Desk/Registration: {Blank single:19197::Katina Slade,Vonda Henderson,Iris Parrish}  Time spent on call: {Blank single:19197::*** minutes on the phone discussing health concerns. *** minutes total spent in review of patient's record and preparation of their chart.,*** minutes with patient face to face via video conference. More than 50% of this time was spent in counseling and coordination of care. *** minutes total spent in review of patient's record and preparation of their chart.}

## 2024-02-29 NOTE — Progress Notes (Signed)
 Scheduled

## 2024-02-29 NOTE — Assessment & Plan Note (Signed)
 Under good control on current regimen. Continue current regimen. Continue to monitor. Call with any concerns. Refills given. Labs need to be drawn- orders in, encouraged patient to come in for them.

## 2024-02-29 NOTE — Assessment & Plan Note (Signed)
 Under good control on current regimen. Continue current regimen. Continue to monitor. Call with any concerns. Refills given for 3 months. Follow up 3 months.

## 2024-02-29 NOTE — Assessment & Plan Note (Signed)
 Doing well with just occasional PRN klonopin . Refill given. 30 pills should last 1 year. Call with any concerns.

## 2024-03-29 ENCOUNTER — Ambulatory Visit: Payer: Self-pay | Admitting: Family Medicine

## 2024-04-08 ENCOUNTER — Encounter: Payer: Self-pay | Admitting: Family Medicine

## 2024-04-08 ENCOUNTER — Ambulatory Visit (INDEPENDENT_AMBULATORY_CARE_PROVIDER_SITE_OTHER): Admitting: Family Medicine

## 2024-04-08 ENCOUNTER — Ambulatory Visit: Payer: Self-pay

## 2024-04-08 VITALS — BP 132/84 | HR 95 | Ht 66.5 in | Wt 147.4 lb

## 2024-04-08 DIAGNOSIS — M25561 Pain in right knee: Secondary | ICD-10-CM

## 2024-04-08 DIAGNOSIS — M5441 Lumbago with sciatica, right side: Secondary | ICD-10-CM

## 2024-04-08 MED ORDER — BACLOFEN 10 MG PO TABS: 5.0000 mg | ORAL_TABLET | Freq: Three times a day (TID) | ORAL | 0 refills | Status: DC | PRN

## 2024-04-08 MED ORDER — NAPROXEN 500 MG PO TABS: 500.0000 mg | ORAL_TABLET | Freq: Two times a day (BID) | ORAL | 0 refills | Status: DC

## 2024-04-08 NOTE — Telephone Encounter (Signed)
 FYI Only or Action Required?: Action required by provider: request for appointment.  Patient was last seen in primary care on 02/28/2024 by Vicci Bouchard P, DO.  Called Nurse Triage reporting Knee Pain.  Symptoms began several weeks ago.  Interventions attempted: Rest, hydration, or home remedies.  Symptoms are: rapidly worsening.  Triage Disposition: See HCP Within 4 Hours (Or PCP Triage)  Patient/caregiver understands and will follow disposition?: YesCopied from CRM #[id number]. Topic: Clinical - Red Word Triage >> Apr 08, 2024  7:41 AM Shawn Ware wrote: Red Word that prompted transfer to Nurse Triage: Pt complaining of sever knee pain can not wait for appt. Reason for Disposition  [1] SEVERE pain (e.g., excruciating, unable to walk) AND [2] not improved after 2 hours of pain medicine  Answer Assessment - Initial Assessment Questions  I think I tore a tendon several weeks ago working out  at gym. Saturday it got worse. I can't walk. Sleeping hurts. I can't lie on leg now. Pain radiates knee to thigh. Pt has tried home remedies with rest and stretches. No office appts. Pt being seen at [facility] this morning.      1. LOCATION and RADIATION: Where is the pain located?      Right knee 2. QUALITY: What does the pain feel like?  (e.g., sharp, dull, aching, burning)     sharp 3. SEVERITY: How bad is the pain? What does it keep you from doing?   (Scale 1-10; or mild, moderate, severe)     10 4. ONSET: When did the pain start? Does it come and go, or is it there all the time?     2 weeks ago 5. RECURRENT: Have you had this pain before? If Yes, ask: When, and what happened then?     na 6. SETTING: Has there been any recent work, exercise or other activity that involved that part of the body?      na 7. AGGRAVATING FACTORS: What makes the knee pain worse? (e.g., walking, climbing stairs, running)     Any movement and position changes  8. ASSOCIATED SYMPTOMS: Is there any  swelling or redness of the knee?     denies 9. OTHER SYMPTOMS: Do you have any other symptoms? (e.g., calf pain, chest pain, difficulty breathing, fever)     denies  Protocols used: Knee Pain-A-AH
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
## 2024-04-08 NOTE — Patient Instructions (Addendum)
 Thank you for coming to the office today.  1. For your Back Pain - I think that this is due to Muscle Spasms or strain. Your Sciatic Nerve can be affected causing some of your radiation and numbness down your legs. 2. Start with anti-inflammatory Naprosyn  (Naproxen ) 500mg  twice daily (12 hrs apart, with food, breakfast and dinner) every day for next 2 to 4 weeks if helping, then can use only as needed 3. Start Baclofen  (Lioresal ) 10mg  tablets - cut in half for 5mg  at night for muscle relaxant - may make you sedated or sleepy (be careful driving or working on this) if tolerated you can take every 8 hours, half or whole tab 4. May use Tylenol Extra Str 500mg  tabs - may take 1-2 tablets every 6 hours as needed 5. Recommend to start using heating pad on your lower back 1-2x daily for few weeks  Also try a Wedge Seat Cushion to avoid nerve pinching when sitting prolonged period of time.  This pain may take weeks to months to fully resolve, but hopefully it will respond to the medicine initially. All back injuries (small or serious) are slow to heal since we use our back muscles every day. Be careful with turning, twisting, lifting, sitting / standing for prolonged periods, and avoid re-injury.  If your symptoms significantly worsen with more pain, or new symptoms with weakness in one or both legs, new or different shooting leg pains, numbness in legs or groin, loss of control or retention of urine or bowel movements, please call back for advice and you may need to go directly to the Emergency Department.    Please schedule a Follow-up Appointment to: Return if symptoms worsen or fail to improve.  If you have any other questions or concerns, please feel free to call the office or send a message through MyChart. You may also schedule an earlier appointment if necessary.  Additionally, you may be receiving a survey about your experience at our office within a few days to 1 week by e-mail or mail. We  value your feedback.  Marsa Officer, DO [facility], [place]

## 2024-04-09 ENCOUNTER — Ambulatory Visit: Payer: Self-pay

## 2024-04-09 NOTE — Telephone Encounter (Signed)
 FYI Only or Action Required?: Action required by provider: clinical question for provider.  Patient was last seen in primary care on 04/08/2024 by Shawn Marsa PARAS, DO.  Called Nurse Triage reporting Knee Pain.  Symptoms began several days ago.  Interventions attempted: Prescription medications: Baclofen , Naprosyn  .  Symptoms are: gradually worsening.Knee pain is worse today, seen yesterday. Asking what else can be done, please advise pt.  Triage Disposition: See HCP Within 4 Hours (Or PCP Triage)  Patient/caregiver understands and will follow disposition?: No, wishes to speak with PCP     Copied from CRM #[id number]. Topic: Clinical - Red Word Triage >> Apr 09, 2024  7:42 AM Amber H wrote: [facility] that prompted transfer to Nurse Triage: Patient called and stated he is still having leg pain that shoots up to waist, feels like muscle spasms or pinched nerves. Spoke to NT yesterday and was seen by provider and was prescribed flexeril  and ibuprofen  however, the pain in now a 10/10. Reason for Disposition  [1] SEVERE pain (e.g., excruciating, unable to walk) AND [2] not improved after 2 hours of pain medicine  Answer Assessment - Initial Assessment Questions 1. LOCATION and RADIATION: Where is the pain located?      Right knee 2. QUALITY: What does the pain feel like?  (e.g., sharp, dull, aching, burning)     stabbing 3. SEVERITY: How bad is the pain? What does it keep you from doing?   (Scale 1-10; or mild, moderate, severe)     10 4. ONSET: When did the pain start? Does it come and go, or is it there all the time?     2 weeks 5. RECURRENT: Have you had this pain before? If Yes, ask: When, and what happened then?     no 6. SETTING: Has there been any recent work, exercise or other activity that involved that part of the body?      no 7. AGGRAVATING FACTORS: What makes the knee pain worse? (e.g., walking, climbing stairs, running)     walking 8.  ASSOCIATED SYMPTOMS: Is there any swelling or redness of the knee?     no 9. OTHER SYMPTOMS: Do you have any other symptoms? (e.g., calf pain, chest pain, difficulty breathing, fever)     no 10. PREGNANCY: Is there any chance you are pregnant? When was your last menstrual period?       N/a  Protocols used: Knee Pain-A-AH

## 2024-04-09 NOTE — Telephone Encounter (Signed)
 Megan,  Routing this telephone call to you for review. I saw him yesterday for acute visit at our office.  If you can review my note. He had no acute issues on his knee with the exam, he focused most of the time discussing his back / sciatica.  I treated him with Naproxen  NSAID and Baclofen . Advised we could order knee X-rays but he declined due to cost / coverage or insurance issue.  It seems like he is contacting us  back now 1 day later, I am not sure what the situation is because he probably has not given the medicines a chance to work.  Would you prefer to follow-up with him? I don't believe we would normally do as much follow-up as outside PCP office. But I am happy to recommend the X-ray again.  Marsa Officer, DO [facility] 04/09/2024, 8:33 AM

## 2024-04-10 ENCOUNTER — Ambulatory Visit: Payer: Self-pay

## 2024-04-10 NOTE — Telephone Encounter (Signed)
 FYI Only or Action Required?: Action required by provider: request for appointment.  Patient was last seen in primary care on 04/08/2024 by Shawn Ware PARAS, DO.  Called Nurse Triage reporting Numbness shin.  Symptoms began a week ago.  Interventions attempted: Prescription medications: Prednisone .  Symptoms are: unchanged.  Triage Disposition: See Physician Within 24 Hours  Patient/caregiver understands and will follow disposition?:       Copied from CRM #[id number]. Topic: Clinical - Red Word Triage >> Apr 10, 2024  8:19 AM Treva T wrote: Red Word that prompted transfer to Nurse Triage: Patient reports he is having numbness with pain, in shin area.  Patient states he checked his blood sugar this morning and was lower than normal, 86.   Patient states he also has questions regarding most recent lab results. He thinks that the medication he is taking, Prednisone , may have an effect on his blood sugars. Answer Assessment - Initial Assessment Questions 1. SYMPTOM: What is the main symptom you are concerned about? (e.g., weakness, numbness)     Numbness right shin 2. ONSET: When did this start? (e.g., minutes, hours, days; while sleeping)     2 weeks 3. LAST NORMAL: When was the last time you (the patient) were normal (no symptoms)?     2 weeks 4. PATTERN Does this come and go, or has it been constant since it started?  Is it present now?     constant 5. CARDIAC SYMPTOMS: Have you had any of the following symptoms: chest pain, difficulty breathing, palpitations?     no 6. NEUROLOGIC SYMPTOMS: Have you had any of the following symptoms: headache, dizziness, vision loss, double vision, changes in speech, unsteady on your feet?     no 7. OTHER SYMPTOMS: Do you have any other symptoms?     no 8. PREGNANCY: Is there any chance you are pregnant? When was your last menstrual period?     N/a  Protocols used: Neurologic Deficit-A-AH

## 2024-04-11 ENCOUNTER — Ambulatory Visit: Admitting: Nurse Practitioner

## 2024-04-16 ENCOUNTER — Ambulatory Visit: Payer: Self-pay

## 2024-04-16 DIAGNOSIS — M545 Low back pain, unspecified: Secondary | ICD-10-CM

## 2024-04-16 DIAGNOSIS — M25561 Pain in right knee: Secondary | ICD-10-CM

## 2024-04-16 NOTE — Telephone Encounter (Signed)
 Appt- OK to get knee x-ray before appt (order in)

## 2024-04-16 NOTE — Addendum Note (Signed)
 Addended by: VICCI DUWAINE SQUIBB on: 04/16/2024 08:28 AM   Modules accepted: Orders

## 2024-04-16 NOTE — Telephone Encounter (Signed)
 FYI Only or Action Required?: Action required by provider: Please see note for patient request.  Patient was last seen in primary care on 04/08/2024 by Edman Marsa PARAS, DO.  Called Nurse Triage reporting Leg Pain.  Symptoms began ongoing - see previous notes.  Interventions attempted: Prescription medications: Baclofen  and Naproxen .  Symptoms are: unchanged.  Triage Disposition: - Call PCP within 24 hours  Patient/caregiver understands and will follow disposition?:   Copied from CRM #[id number]. Topic: Clinical - Red Word Triage >> Apr 16, 2024  7:33 AM Donna BRAVO wrote: Red Word that prompted transfer to Nurse Triage: patient having right leg pain, started 3 weeks ago progressively getting worse,  Today still has leg pain that is doing a little better Reason for Disposition  [1] Follow-up call from patient regarding patient's clinical status AND [2] information NON-URGENT  Answer Assessment - Initial Assessment Questions 1. REASON FOR CALL or QUESTION: What is your reason for calling today? or How can I best     Patient calling in to report that his leg pain is not fully better, he states that the Cortisone injection he got in his back helped a lot, and he wants to see if this is an option to try in his leg area. He states that his knee isn't hurting as much, but he believes the sciatic nerve that is bothering him down in his shin area. He is still having numbness in the right shin. Please advise.    2. CALLER: Document the source of call. (e.g., laboratory staff, caregiver or patient).     Patient  Protocols used: PCP Call - No Triage-A-AH

## 2024-04-17 NOTE — Telephone Encounter (Signed)
 Scheduled at [facility]

## 2024-04-22 ENCOUNTER — Ambulatory Visit: Admitting: Internal Medicine

## 2024-04-22 NOTE — Progress Notes (Deleted)
 Subjective:    Patient ID: Shawn Ware, male    DOB: [birth year], 53 y.o.   MRN: [id number]  HPI    Review of Systems   Past Medical History:  Diagnosis Date   Chest pain    Hyperlipidemia     Current Outpatient Medications  Medication Sig Dispense Refill   amLODipine  (NORVASC ) 10 MG tablet Take 1 tablet (10 mg total) by mouth daily. 90 tablet 0   atorvastatin  (LIPITOR) 40 MG tablet TAKE 1 TABLET BY MOUTH ONCE EVERY EVENING 90 tablet 0   baclofen  (LIORESAL ) 10 MG tablet Take 0.5-1 tablets (5-10 mg total) by mouth 3 (three) times daily as needed for muscle spasms. 60 each 0   clonazePAM  (KLONOPIN ) 0.5 MG tablet TAKE 1 TABLET BY MOUTH ONCE DAILY AS NEEDED ANXIETY 30 tablet 0   Eszopiclone  3 MG TABS TAKE 1 TABLET BY MOUTH AT BEDTIME AS NEEDED. TAKE IMMEDIATELY BEFORE BEDTIME 30 tablet 2   fexofenadine  (ALLEGRA  ALLERGY) 180 MG tablet Take 1 tablet (180 mg total) by mouth daily. 100 tablet 3   fluticasone  (FLONASE ) 50 MCG/ACT nasal spray Place 2 sprays into both nostrils daily. 16 g 6   naproxen  (NAPROSYN ) 500 MG tablet Take 1 tablet (500 mg total) by mouth 2 (two) times daily with a meal. For 2-4 weeks then as needed 60 tablet 0   tamsulosin  (FLOMAX ) 0.4 MG CAPS capsule Take 1 capsule (0.4 mg total) by mouth daily. 30 capsule 3   No current facility-administered medications for this visit.    Allergies  Allergen Reactions   Penicillins    Ambien  [Zolpidem ] Palpitations   Lisinopril  Rash    Family History  Problem Relation Age of Onset   Heart attack Mother    Hypertension Mother    Hyperlipidemia Mother    Diabetes Maternal Grandmother    Hyperlipidemia Father     Social History   Socioeconomic History   Marital status: Single    Spouse name: Not on file   Number of children: 0   Years of education: Not on file   Highest education level: Not on file  Occupational History   Occupation: IT  Tobacco Use   Smoking status: Former    Current packs/day: 0.00     Average packs/day: 0.5 packs/day for 17.0 years (8.5 ttl pk-yrs)    Types: Cigarettes    Start date: 03/26/2000    Quit date: 03/26/2017    Years since quitting: 7.0   Smokeless tobacco: Never  Vaping Use   Vaping status: Never Used  Substance and Sexual Activity   Alcohol use: Yes    Comment: socially   Drug use: No   Sexual activity: Yes  Other Topics Concern   Not on file  Social History Narrative   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 He is a smoker. He denies alcohol consumption   Social Drivers of Corporate Investment Banker Strain: Not on file  Food Insecurity: Not on file  Transportation Needs: Not on file  Physical Activity: Not on file  Stress: Not on file  Social Connections: Not on file  Intimate Partner Violence: Not on file     Constitutional: Denies fever, malaise, fatigue, headache or abrupt weight changes.  HEENT: Denies eye pain, eye redness, ear pain, ringing in the ears, wax buildup, runny nose, nasal congestion, bloody nose, or sore throat. Respiratory: Denies difficulty breathing, shortness of breath, cough or sputum production.   Cardiovascular: Denies chest pain, chest tightness,  palpitations or swelling in the hands or feet.  Gastrointestinal: Denies abdominal pain, bloating, constipation, diarrhea or blood in the stool.  GU: Denies urgency, frequency, pain with urination, burning sensation, blood in urine, odor or discharge. Musculoskeletal: Denies decrease in range of motion, difficulty with gait, muscle pain or joint pain and swelling.  Skin: Denies redness, rashes, lesions or ulcercations.  Neurological: Denies dizziness, difficulty with memory, difficulty with speech or problems with balance and coordination.  Psych: Denies anxiety, depression, SI/HI.  No other specific complaints in a complete review of systems (except as listed in HPI above).      Objective:   Physical Exam  There were no vitals taken for this visit. Wt Readings from Last 3  Encounters:  04/08/24 147 lb 6 oz (66.8 kg)  02/28/24 145 lb (65.8 kg)  09/20/23 149 lb (67.6 kg)    General: Appears their stated age, well developed, well nourished in NAD. Skin: Warm, dry and intact. No rashes, lesions or ulcerations noted. HEENT: Head: normal shape and size; Eyes: sclera white, no icterus, conjunctiva pink, PERRLA and EOMs intact; Ears: Tm's gray and intact, normal light reflex; Nose: mucosa pink and moist, septum midline; Throat/Mouth: Teeth present, mucosa pink and moist, no exudate, lesions or ulcerations noted.  Neck:  Neck supple, trachea midline. No masses, lumps or thyromegaly present.  Cardiovascular: Normal rate and rhythm. S1,S2 noted.  No murmur, rubs or gallops noted. No JVD or BLE edema. No carotid bruits noted. Pulmonary/Chest: Normal effort and positive vesicular breath sounds. No respiratory distress. No wheezes, rales or ronchi noted.  Abdomen: Soft and nontender. Normal bowel sounds. No distention or masses noted. Liver, spleen and kidneys non palpable. Musculoskeletal: Normal range of motion. No signs of joint swelling. No difficulty with gait.  Neurological: Alert and oriented. Cranial nerves II-XII grossly intact. Coordination normal.  Psychiatric: Mood and affect normal. Behavior is normal. Judgment and thought content normal.    BMET    Component Value Date/Time   NA 141 07/24/2023 0920   NA 140 12/11/2011 1105   K 3.7 07/24/2023 0920   K 4.2 12/11/2011 1105   CL 102 07/24/2023 0920   CL 105 12/11/2011 1105   CO2 22 07/24/2023 0920   CO2 28 12/11/2011 1105   GLUCOSE 106 (H) 07/24/2023 0920   GLUCOSE 92 12/11/2011 1105   BUN 8 07/24/2023 0920   BUN 16 12/11/2011 1105   CREATININE 0.86 07/24/2023 0920   CREATININE 1.01 12/11/2011 1105   CALCIUM  8.6 (L) 07/24/2023 0920   CALCIUM  8.9 12/11/2011 1105   GFRNONAA 107 05/10/2018 1143   GFRNONAA >60 12/11/2011 1105   GFRAA 123 05/10/2018 1143   GFRAA >60 12/11/2011 1105    Lipid Panel      Component Value Date/Time   CHOL 206 (H) 05/18/2023 0831   CHOL 140 05/23/2016 1034   TRIG 127 05/18/2023 0831   TRIG 140 05/23/2016 1034   HDL 70 05/18/2023 0831   VLDL 28 05/23/2016 1034   LDLCALC 114 (H) 05/18/2023 0831    CBC    Component Value Date/Time   WBC 8.0 05/18/2023 0831   WBC 12.3 (H) 12/11/2011 1105   RBC 5.37 05/18/2023 0831   RBC 4.99 12/11/2011 1105   HGB 15.2 05/18/2023 0831   HCT 47.8 05/18/2023 0831   PLT 304 05/18/2023 0831   MCV 89 05/18/2023 0831   MCV 85 12/11/2011 1105   MCH 28.3 05/18/2023 0831   MCH 28.0 12/11/2011 1105   MCHC 31.8  05/18/2023 0831   MCHC 32.9 12/11/2011 1105   RDW 12.7 05/18/2023 0831   RDW 13.3 12/11/2011 1105   LYMPHSABS 1.9 05/18/2023 0831   EOSABS 0.0 05/18/2023 0831   BASOSABS 0.1 05/18/2023 0831    Hgb A1C Lab Results  Component Value Date   HGBA1C 5.0 08/01/2017            Assessment & Plan:    Follow-up with your PCP as previously scheduled Angeline Laura, NP

## 2024-05-08 ENCOUNTER — Other Ambulatory Visit: Payer: Self-pay | Admitting: Family Medicine

## 2024-05-09 ENCOUNTER — Encounter: Payer: Self-pay | Admitting: Family Medicine

## 2024-05-10 NOTE — Telephone Encounter (Signed)
 Requested medication (s) are due for refill today: yes  Requested medication (s) are on the active medication list: yes  Last refill:  02/28/24  Future visit scheduled: yes  Notes to clinic:  Unable to refill per protocol, cannot delegate.      Requested Prescriptions  Pending Prescriptions Disp Refills   Eszopiclone  3 MG TABS [Pharmacy Med Name: ESZOPICLONE  3 MG TAB] 30 tablet     Sig: TAKE 1 TABLET BY MOUTH AT BEDTIME AS NEEDED. TAKE IMMEDIATELY BEFORE BEDTIME     Not Delegated - Psychiatry:  Anxiolytics/Hypnotics Failed - 05/10/2024 10:55 AM      Failed - This refill cannot be delegated      Failed - Urine Drug Screen completed in last 360 days      Passed - Valid encounter within last 6 months    Recent Outpatient Visits           1 month ago Acute right-sided low back pain with right-sided sciatica   [place] [facility] [place] Marsa PARAS, DO   2 months ago Primary hypertension   [place] [facility] [place], [name], DO   6 months ago Insomnia, unspecified type   [place] [facility] Melvin Pao, NP   7 months ago Influenza A   [place] [facility] Melvin Pao, NP

## 2024-05-28 ENCOUNTER — Ambulatory Visit: Payer: Self-pay | Admitting: Family Medicine

## 2024-05-28 ENCOUNTER — Other Ambulatory Visit: Payer: Self-pay | Admitting: Family Medicine

## 2024-05-30 NOTE — Telephone Encounter (Signed)
 Requested medications are due for refill today.  yes  Requested medications are on the active medications list.  yes  Last refill. 02/28/2024 #30 2 rf  Future visit scheduled.   yes  Notes to clinic.  Refill not delegated.    Requested Prescriptions  Pending Prescriptions Disp Refills   Eszopiclone  3 MG TABS [Pharmacy Med Name: ESZOPICLONE  3 MG TAB] 30 tablet     Sig: TAKE 1 TABLET BY MOUTH AT BEDTIME AS NEEDED. TAKE IMMEDIATELY BEFORE BEDTIME     Not Delegated - Psychiatry:  Anxiolytics/Hypnotics Failed - 05/30/2024  4:32 PM      Failed - This refill cannot be delegated      Failed - Urine Drug Screen completed in last 360 days      Passed - Valid encounter within last 6 months    Recent Outpatient Visits           1 month ago Acute right-sided low back pain with right-sided sciatica   [place] [facility] [place], Marsa PARAS, DO   3 months ago Primary hypertension   [facility] [place], [name], DO   7 months ago Insomnia, unspecified type   [place] [facility] Melvin Pao, NP   8 months ago Influenza A   [place] [facility] Melvin Pao, NP

## 2024-05-31 ENCOUNTER — Ambulatory Visit: Payer: Self-pay | Admitting: Family Medicine

## 2024-05-31 ENCOUNTER — Telehealth: Admitting: Nurse Practitioner

## 2024-05-31 ENCOUNTER — Encounter: Payer: Self-pay | Admitting: Nurse Practitioner

## 2024-05-31 VITALS — BP 126/79 | Temp 98.2°F | Ht 66.5 in | Wt 145.0 lb

## 2024-05-31 DIAGNOSIS — M25561 Pain in right knee: Secondary | ICD-10-CM

## 2024-05-31 DIAGNOSIS — E782 Mixed hyperlipidemia: Secondary | ICD-10-CM | POA: Diagnosis not present

## 2024-05-31 DIAGNOSIS — I10 Essential (primary) hypertension: Secondary | ICD-10-CM

## 2024-05-31 DIAGNOSIS — M5441 Lumbago with sciatica, right side: Secondary | ICD-10-CM | POA: Diagnosis not present

## 2024-05-31 DIAGNOSIS — G4700 Insomnia, unspecified: Secondary | ICD-10-CM

## 2024-05-31 MED ORDER — ESZOPICLONE 3 MG PO TABS: ORAL_TABLET | ORAL | 1 refills | Status: DC

## 2024-05-31 MED ORDER — AMLODIPINE BESYLATE 10 MG PO TABS: 10.0000 mg | ORAL_TABLET | Freq: Every day | ORAL | 0 refills | Status: DC

## 2024-05-31 MED ORDER — ATORVASTATIN CALCIUM 40 MG PO TABS: ORAL_TABLET | ORAL | 0 refills | Status: DC

## 2024-05-31 NOTE — Assessment & Plan Note (Signed)
 Chronic. Controlled with Lunesta .  Refills sent in for 2 months.  Patient will need labs and office visit with PCP for further refills.

## 2024-05-31 NOTE — Assessment & Plan Note (Signed)
 Chronic.  Controlled.  Continue with current medication regimen of atorvastatin . Patient is due for Labs will send in 60 day supply of medications.  Patient needs to be seen by PCP in office in 6 weeks for lab work for further refills.

## 2024-05-31 NOTE — Assessment & Plan Note (Signed)
 Chronic.  Controlled.  Continue with current medication regimen of Amlodipine .  Patient is due for Labs will send in 60 day supply of medications.  Patient needs to be seen by PCP in office in 6 weeks for lab work for further refills.

## 2024-05-31 NOTE — Progress Notes (Signed)
 BP 126/79 (BP Location: Left Arm, Patient Position: Sitting)   Temp 98.2 F (36.8 C)   Ht 5' 6.5 (1.689 m)   Wt 145 lb (65.8 kg)   BMI 23.06 kg/m    Subjective:    Patient ID: Shawn Ware, male    DOB: 01/24/1971, 53 y.o.   MRN: [id number]  HPI: Shawn Ware is a 53 y.o. male  Chief Complaint  Patient presents with   Medication Refill   HYPERTENSION / HYPERLIPIDEMIA Satisfied with current treatment? yes Duration of hypertension: years BP monitoring frequency: daily BP range: 125/80 BP medication side effects: no Past BP meds: amlodipine  Duration of hyperlipidemia: years Cholesterol medication side effects: no Cholesterol supplements: none Past cholesterol medications: atorvastain (lipitor) Medication compliance: excellent compliance Aspirin: no Recent stressors: no Recurrent headaches: no Visual changes: no Palpitations: no Dyspnea: no Chest pain: no Lower extremity edema: no Dizzy/lightheaded: no  Patient states he had an injury to his leg and has been taking baclofen  to help with the pain.  States he hasn't been taking it recently.  He is taking ibuprofen  and Meloxicam.  He is following up with Orthopedics.    ANXIETY/INSOMNIA He takes Clonazepam  and Lunesta .  He does not need refills at this time of the Clonazepam  but is due to the Lunesta .  Endorses taking the medication nightly.  Denies any side effects from medication.   Relevant past medical, surgical, family and social history reviewed and updated as indicated. Interim medical history since our last visit reviewed. Allergies and medications reviewed and updated.  Review of Systems  Eyes:  Negative for visual disturbance.  Respiratory:  Negative for shortness of breath.   Cardiovascular:  Negative for chest pain and leg swelling.  Musculoskeletal:        Hip pain  Neurological:  Negative for light-headedness and headaches.  Psychiatric/Behavioral:  Positive for sleep disturbance. The patient is  nervous/anxious.     Per HPI unless specifically indicated above     Objective:    BP 126/79 (BP Location: Left Arm, Patient Position: Sitting)   Temp 98.2 F (36.8 C)   Ht 5' 6.5 (1.689 m)   Wt 145 lb (65.8 kg)   BMI 23.06 kg/m   Wt Readings from Last 3 Encounters:  05/31/24 145 lb (65.8 kg)  04/08/24 147 lb 6 oz (66.8 kg)  02/28/24 145 lb (65.8 kg)    Physical Exam Vitals and nursing note reviewed.  Constitutional:      General: He is not in acute distress.    Appearance: He is not ill-appearing.  HENT:     Head: Normocephalic.     Right Ear: Hearing normal.     Left Ear: Hearing normal.     Nose: Nose normal.  Pulmonary:     Effort: Pulmonary effort is normal. No respiratory distress.  Neurological:     Mental Status: He is alert.  Psychiatric:        Mood and Affect: Mood normal.        Behavior: Behavior normal.        Thought Content: Thought content normal.        Judgment: Judgment normal.     Results for orders placed or performed in visit on 09/20/23  Veritor Flu A/B Waived   Collection Time: 09/20/23  2:23 PM  Result Value Ref Range   Influenza A Positive (A) Negative   Influenza B Negative Negative      Assessment & Plan:   Problem List Items Addressed  This Visit       Cardiovascular and Mediastinum   Hypertension - Primary   Chronic.  Controlled.  Continue with current medication regimen of Amlodipine .  Patient is due for Labs will send in 60 day supply of medications.  Patient needs to be seen by PCP in office in 6 weeks for lab work for further refills.        Relevant Medications   amLODipine  (NORVASC ) 10 MG tablet   atorvastatin  (LIPITOR) 40 MG tablet     Other   Hyperlipidemia   Chronic.  Controlled.  Continue with current medication regimen of atorvastatin . Patient is due for Labs will send in 60 day supply of medications.  Patient needs to be seen by PCP in office in 6 weeks for lab work for further refills.        Relevant  Medications   amLODipine  (NORVASC ) 10 MG tablet   atorvastatin  (LIPITOR) 40 MG tablet   Insomnia   Chronic. Controlled with Lunesta .  Refills sent in for 2 months.  Patient will need labs and office visit with PCP for further refills.      Other Visit Diagnoses       Acute right-sided low back pain with right-sided sciatica       Relevant Medications   Eszopiclone  3 MG TABS     Right medial knee pain            Follow up plan: Return in about 6 weeks (around 07/12/2024) for FU with Dr. Vicci .    This visit was completed via MyChart due to the restrictions of the COVID-19 pandemic. All issues as above were discussed and addressed. Physical exam was done as above through visual confirmation on MyChart. If it was felt that the patient should be evaluated in the office, they were directed there. The patient verbally consented to this visit. Location of the patient: Home Location of the provider: Office Those involved with this call:  Provider: Darice Petty, NP CMA: Joya Vicci, CMA Front Desk/Registration: Claretta Maiden This encounter was conducted via video.  I spent 20 minutes dedicated to the care of this patient on the date of this encounter to include previsit review of plan of care, medications, future visit and lab work, face to face time with the patient, and post visit ordering of testing.

## 2024-05-31 NOTE — Progress Notes (Deleted)
   There were no vitals taken for this visit.   Subjective:    Patient ID: Shawn Ware, male    DOB: [birth year], 53 y.o.   MRN: [id number]  HPI: Shawn Ware is a 53 y.o. male  No chief complaint on file.  UPPER RESPIRATORY TRACT INFECTION Worst symptom: Fever: {Blank single:19197::yes,no} Cough: {Blank single:19197::yes,no} Shortness of breath: {Blank single:19197::yes,no} Wheezing: {Blank single:19197::yes,no} Chest pain: {Blank single:19197::yes,no,yes, with cough} Chest tightness: {Blank single:19197::yes,no} Chest congestion: {Blank single:19197::yes,no} Nasal congestion: {Blank single:19197::yes,no} Runny nose: {Blank single:19197::yes,no} Post nasal drip: {Blank single:19197::yes,no} Sneezing: {Blank single:19197::yes,no} Sore throat: {Blank single:19197::yes,no} Swollen glands: {Blank single:19197::yes,no} Sinus pressure: {Blank single:19197::yes,no} Headache: {Blank single:19197::yes,no} Face pain: {Blank single:19197::yes,no} Toothache: {Blank single:19197::yes,no} Ear pain: {Blank single:19197::yes,no} {Blank single:19197::right,left, bilateral} Ear pressure: {Blank single:19197::yes,no} {Blank single:19197::right,left, bilateral} Eyes red/itching:{Blank single:19197::yes,no} Eye drainage/crusting: {Blank single:19197::yes,no}  Vomiting: {Blank single:19197::yes,no} Rash: {Blank single:19197::yes,no} Fatigue: {Blank single:19197::yes,no} Sick contacts: {Blank single:19197::yes,no} Strep contacts: {Blank single:19197::yes,no}  Context: {Blank multiple:19196::better,worse,stable,fluctuating} Recurrent sinusitis: {Blank single:19197::yes,no} Relief with OTC cold/cough medications: {Blank single:19197::yes,no}  Treatments attempted: {Blank multiple:19196::none,cold/sinus,mucinex,anti-histamine,pseudoephedrine,cough  syrup,antibiotics}   Relevant past medical, surgical, family and social history reviewed and updated as indicated. Interim medical history since our last visit reviewed. Allergies and medications reviewed and updated.  Review of Systems  Per HPI unless specifically indicated above     Objective:    There were no vitals taken for this visit.  Wt Readings from Last 3 Encounters:  04/08/24 147 lb 6 oz (66.8 kg)  02/28/24 145 lb (65.8 kg)  09/20/23 149 lb (67.6 kg)    Physical Exam  Results for orders placed or performed in visit on 09/20/23  Veritor Flu A/B Waived   Collection Time: 09/20/23  2:23 PM  Result Value Ref Range   Influenza A Positive (A) Negative   Influenza B Negative Negative      Assessment & Plan:   Problem List Items Addressed This Visit   None    Follow up plan: No follow-ups on file.   This visit was completed via MyChart due to the restrictions of the COVID-19 pandemic. All issues as above were discussed and addressed. Physical exam was done as above through visual confirmation on MyChart. If it was felt that the patient should be evaluated in the office, they were directed there. The patient verbally consented to this visit. Location of the patient: *** Location of the provider: Office Those involved with this call:  Provider: Darice Petty, NP CMA: *** Front Desk/Registration: *** This encounter was conducted via ***.  I spent *** dedicated to the care of this patient on the date of this encounter to include previsit review of ***, face to face time with the patient, and post visit ordering of testing.

## 2024-07-17 ENCOUNTER — Ambulatory Visit (INDEPENDENT_AMBULATORY_CARE_PROVIDER_SITE_OTHER): Admitting: Family Medicine

## 2024-07-17 ENCOUNTER — Encounter: Payer: Self-pay | Admitting: Family Medicine

## 2024-07-17 VITALS — BP 138/83 | HR 98 | Temp 97.8°F | Resp 16 | Ht 66.5 in | Wt 150.6 lb

## 2024-07-17 DIAGNOSIS — E782 Mixed hyperlipidemia: Secondary | ICD-10-CM

## 2024-07-17 DIAGNOSIS — Z833 Family history of diabetes mellitus: Secondary | ICD-10-CM | POA: Diagnosis not present

## 2024-07-17 DIAGNOSIS — Z0000 Encounter for general adult medical examination without abnormal findings: Secondary | ICD-10-CM | POA: Diagnosis not present

## 2024-07-17 DIAGNOSIS — I10 Essential (primary) hypertension: Secondary | ICD-10-CM | POA: Diagnosis not present

## 2024-07-17 DIAGNOSIS — R3914 Feeling of incomplete bladder emptying: Secondary | ICD-10-CM | POA: Diagnosis not present

## 2024-07-17 DIAGNOSIS — M79602 Pain in left arm: Secondary | ICD-10-CM | POA: Diagnosis not present

## 2024-07-17 DIAGNOSIS — N401 Enlarged prostate with lower urinary tract symptoms: Secondary | ICD-10-CM

## 2024-07-17 DIAGNOSIS — G4700 Insomnia, unspecified: Secondary | ICD-10-CM

## 2024-07-17 LAB — BAYER DCA HB A1C WAIVED: HB A1C (BAYER DCA - WAIVED): 5.5 % (ref 4.8–5.6)

## 2024-07-17 LAB — MICROALBUMIN, URINE WAIVED
Creatinine, Urine Waived: 100 mg/dL (ref 10–300)
Microalb, Ur Waived: 30 mg/L — ABNORMAL HIGH (ref 0–19)
Microalb/Creat Ratio: <30 mg/g

## 2024-07-17 MED ORDER — AMLODIPINE BESYLATE 10 MG PO TABS: 10.0000 mg | ORAL_TABLET | Freq: Every day | ORAL | 1 refills | Status: AC

## 2024-07-17 MED ORDER — NAPROXEN 500 MG PO TABS: 500.0000 mg | ORAL_TABLET | Freq: Two times a day (BID) | ORAL | 1 refills | Status: DC

## 2024-07-17 MED ORDER — FEXOFENADINE HCL 180 MG PO TABS: 180.0000 mg | ORAL_TABLET | Freq: Every day | ORAL | 3 refills | Status: AC

## 2024-07-17 MED ORDER — ESZOPICLONE 3 MG PO TABS: ORAL_TABLET | ORAL | 2 refills | Status: AC

## 2024-07-17 MED ORDER — CLONAZEPAM 0.5 MG PO TABS: ORAL_TABLET | ORAL | 0 refills | Status: AC

## 2024-07-17 MED ORDER — ATORVASTATIN CALCIUM 40 MG PO TABS: ORAL_TABLET | ORAL | 1 refills | Status: AC

## 2024-07-17 MED ORDER — FLUTICASONE PROPIONATE 50 MCG/ACT NA SUSP: 2.0000 | Freq: Every day | NASAL | 6 refills | Status: AC

## 2024-07-17 NOTE — Assessment & Plan Note (Signed)
 Under good control on current regimen. Continue current regimen. Continue to monitor. Call with any concerns. Refills given for 3 months. Follow up 3 months.

## 2024-07-17 NOTE — Assessment & Plan Note (Signed)
 Doing great. Has no symptoms. Not on any medicine. Call with any concerns.

## 2024-07-17 NOTE — Progress Notes (Signed)
 "  BP 138/83 (BP Location: Left Arm, Patient Position: Sitting, Cuff Size: Normal)   Pulse 98   Temp 97.8 F (36.6 C) (Oral)   Resp 16   Ht 5' 6.5 (1.689 m)   Wt 150 lb 9.6 oz (68.3 kg)   SpO2 97%   BMI 23.95 kg/m    Subjective:    Patient ID: Shawn Ware, male    DOB: Nov 13, 1970, 54 y.o.   MRN: [id number]  HPI: Shawn Ware is a 54 y.o. male presenting on 07/17/2024 for comprehensive medical examination. Current medical complaints include:  HYPERTENSION / HYPERLIPIDEMIA Satisfied with current treatment? yes Duration of hypertension: chronic BP monitoring frequency: not checking BP medication side effects: no Past BP meds: amlodipine  Duration of hyperlipidemia: chronic Cholesterol medication side effects: no Cholesterol supplements: none Past cholesterol medications: atorvastatin  Medication compliance: excellent compliance Aspirin: no Recent stressors: no Recurrent headaches: no Visual changes: no Palpitations: no Dyspnea: no Chest pain: no Lower extremity edema: no Dizzy/lightheaded: no  BPH BPH status: controlled Satisfied with current treatment?: not on anything Medication side effects: N/A Duration: not having any issues Nocturia: no Urinary frequency:no Incomplete voiding: no Urgency: no Weak urinary stream: no Straining to start stream: no Dysuria: no  ARM PAIN Duration: 3-4 months ago Location: L arm, upper back of his arm in the tricep Mechanism of injury: unknown Onset: gradual Severity: severe  Quality:  stabbing Frequency: with movement Radiation: no Aggravating factors: movement abduction Alleviating factors: nothing  Status: stable Treatments attempted: rest and heat  Relief with NSAIDs?:  No NSAIDs Taken Swelling: no Redness: no  Warmth: no Trauma: no Chest pain: no  Shortness of breath: no  Fever: no Decreased sensation: no Paresthesias: no Weakness: no  INSOMNIA Duration: chronic Satisfied with sleep quality:  yes Difficulty falling asleep: no Difficulty staying asleep: no Waking a few hours after sleep onset: no Early morning awakenings: no Daytime hypersomnolence: no Wakes feeling refreshed: no Good sleep hygiene: no Apnea: no Snoring: no Depressed/anxious mood: no Recent stress: no Restless legs/nocturnal leg cramps: no Chronic pain/arthritis: no History of sleep study: no Treatments attempted: lunesta    Interim Problems from his last visit: no  Depression Screen done today and results listed below:     07/17/2024    9:18 AM 10/27/2023   10:20 AM 09/20/2023    2:24 PM 07/24/2023    9:20 AM 05/23/2023    9:14 AM  Depression screen PHQ 2/9  Decreased Interest 2 1 2 2 2   Down, Depressed, Hopeless 1 1 1 2 1   PHQ - 2 Score 3 2 3 4 3   Altered sleeping 1 2 2 2 1   Tired, decreased energy 0 0 0 1 0  Change in appetite 0 0 0 0 0  Feeling bad or failure about yourself  1 0 1 0 1  Trouble concentrating 1 1 1 1 1   Moving slowly or fidgety/restless 0 0 0 0 0  Suicidal thoughts 0 0 0 0 0  PHQ-9 Score 6 5  7  8  6    Difficult doing work/chores Not difficult at all Not difficult at all   Somewhat difficult     Data saved with a previous flowsheet row definition     Past Medical History:  Past Medical History:  Diagnosis Date   Chest pain    Hyperlipidemia     Surgical History:  Past Surgical History:  Procedure Laterality Date   HERNIA REPAIR      Medications:  Current Outpatient Medications  on File Prior to Visit  Medication Sig   baclofen  (LIORESAL ) 10 MG tablet Take 0.5-1 tablets (5-10 mg total) by mouth 3 (three) times daily as needed for muscle spasms.   No current facility-administered medications on file prior to visit.    Allergies:  Allergies[1]  Social History:  Social History   Socioeconomic History   Marital status: Single    Spouse name: Not on file   Number of children: 0   Years of education: Not on file   Highest education level: Not on file   Occupational History   Occupation: IT  Tobacco Use   Smoking status: Former    Current packs/day: 0.00    Average packs/day: 0.5 packs/day for 17.0 years (8.5 ttl pk-yrs)    Types: Cigarettes    Start date: 03/26/2000    Quit date: 03/26/2017    Years since quitting: 7.3   Smokeless tobacco: Never  Vaping Use   Vaping status: Never Used  Substance and Sexual Activity   Alcohol use: Yes    Comment: socially   Drug use: No   Sexual activity: Yes  Other Topics Concern   Not on file  Social History Narrative   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 He is a smoker. He denies alcohol consumption   Social Drivers of Health   Tobacco Use: Medium Risk (07/17/2024)   Patient History    Smoking Tobacco Use: Former    Smokeless Tobacco Use: Never    Passive Exposure: Not on Actuary Strain: Not on file  Food Insecurity: Not on file  Transportation Needs: Not on file  Physical Activity: Not on file  Stress: Not on file  Social Connections: Not on file  Intimate Partner Violence: Not on file  Depression (PHQ2-9): Medium Risk (07/17/2024)   Depression (PHQ2-9)    PHQ-2 Score: 6  Alcohol Screen: Not on file  Housing: Not on file  Utilities: Not on file  Health Literacy: Not on file   Tobacco Use History[2] Social History   Substance and Sexual Activity  Alcohol Use Yes   Comment: socially    Family History:  Family History  Problem Relation Age of Onset   Heart attack Mother    Hypertension Mother    Hyperlipidemia Mother    Diabetes Maternal Grandmother    Hyperlipidemia Father     Past medical history, surgical history, medications, allergies, family history and social history reviewed with patient today and changes made to appropriate areas of the chart.   Review of Systems  Constitutional: Negative.   HENT: Negative.    Eyes:  Positive for blurred vision. Negative for double vision, photophobia, pain, discharge and redness.  Respiratory: Negative.     Cardiovascular: Negative.   Gastrointestinal:  Positive for blood in stool, diarrhea and heartburn (with food choices). Negative for abdominal pain, constipation, melena, nausea and vomiting.  Genitourinary: Negative.   Musculoskeletal:  Positive for myalgias. Negative for back pain, falls, joint pain and neck pain.  Skin: Negative.   Neurological: Negative.   Endo/Heme/Allergies:  Positive for environmental allergies. Negative for polydipsia. Does not bruise/bleed easily.  Psychiatric/Behavioral: Negative.     All other ROS negative except what is listed above and in the HPI.      Objective:    BP 138/83 (BP Location: Left Arm, Patient Position: Sitting, Cuff Size: Normal)   Pulse 98   Temp 97.8 F (36.6 C) (Oral)   Resp 16   Ht 5'  6.5 (1.689 m)   Wt 150 lb 9.6 oz (68.3 kg)   SpO2 97%   BMI 23.95 kg/m   Wt Readings from Last 3 Encounters:  07/17/24 150 lb 9.6 oz (68.3 kg)  05/31/24 145 lb (65.8 kg)  04/08/24 147 lb 6 oz (66.8 kg)    Physical Exam Vitals and nursing note reviewed.  Constitutional:      General: He is not in acute distress.    Appearance: Normal appearance. He is not ill-appearing, toxic-appearing or diaphoretic.  HENT:     Head: Normocephalic and atraumatic.     Right Ear: Tympanic membrane, ear canal and external ear normal. There is no impacted cerumen.     Left Ear: Tympanic membrane, ear canal and external ear normal. There is no impacted cerumen.     Nose: Nose normal. No congestion or rhinorrhea.     Mouth/Throat:     Mouth: Mucous membranes are moist.     Pharynx: Oropharynx is clear. No oropharyngeal exudate or posterior oropharyngeal erythema.  Eyes:     General: No scleral icterus.       Right eye: No discharge.        Left eye: No discharge.     Extraocular Movements: Extraocular movements intact.     Conjunctiva/sclera: Conjunctivae normal.     Pupils: Pupils are equal, round, and reactive to light.  Neck:     Vascular: No carotid  bruit.  Cardiovascular:     Rate and Rhythm: Normal rate and regular rhythm.     Pulses: Normal pulses.     Heart sounds: No murmur heard.    No friction rub. No gallop.  Pulmonary:     Effort: Pulmonary effort is normal. No respiratory distress.     Breath sounds: Normal breath sounds. No stridor. No wheezing, rhonchi or rales.  Chest:     Chest wall: No tenderness.  Abdominal:     General: Abdomen is flat. Bowel sounds are normal. There is no distension.     Palpations: Abdomen is soft. There is no mass.     Tenderness: There is no abdominal tenderness. There is no right CVA tenderness, left CVA tenderness, guarding or rebound.     Hernia: No hernia is present.  Genitourinary:    Comments: Genital exam deferred with shared decision making Musculoskeletal:        General: No swelling, tenderness, deformity or signs of injury.     Cervical back: Normal range of motion and neck supple. No rigidity. No muscular tenderness.     Right lower leg: No edema.     Left lower leg: No edema.  Lymphadenopathy:     Cervical: No cervical adenopathy.  Skin:    General: Skin is warm and dry.     Capillary Refill: Capillary refill takes less than 2 seconds.     Coloration: Skin is not jaundiced or pale.     Findings: No bruising, erythema, lesion or rash.  Neurological:     General: No focal deficit present.     Mental Status: He is alert and oriented to person, place, and time.     Cranial Nerves: No cranial nerve deficit.     Sensory: No sensory deficit.     Motor: No weakness.     Coordination: Coordination normal.     Gait: Gait normal.     Deep Tendon Reflexes: Reflexes normal.  Psychiatric:        Mood and Affect: Mood normal.  Behavior: Behavior normal.        Thought Content: Thought content normal.        Judgment: Judgment normal.     Results for orders placed or performed in visit on 09/20/23  Veritor Flu A/B Waived   Collection Time: 09/20/23  2:23 PM  Result Value  Ref Range   Influenza A Positive (A) Negative   Influenza B Negative Negative      Assessment & Plan:   Problem List Items Addressed This Visit       Cardiovascular and Mediastinum   Hypertension   Under good control on current regimen. Continue current regimen. Continue to monitor. Call with any concerns. Refills given. Labs drawn today.        Relevant Medications   amLODipine  (NORVASC ) 10 MG tablet   atorvastatin  (LIPITOR) 40 MG tablet   Other Relevant Orders   Microalbumin, Urine Waived     Genitourinary   Benign prostatic hyperplasia with incomplete bladder emptying   Doing great. Has no symptoms. Not on any medicine. Call with any concerns.         Other   Hyperlipidemia   Rechecking labs today. Await results. Treat as needed.       Relevant Medications   amLODipine  (NORVASC ) 10 MG tablet   atorvastatin  (LIPITOR) 40 MG tablet   Insomnia   Under good control on current regimen. Continue current regimen. Continue to monitor. Call with any concerns. Refills given for 3 months. Follow up 3 months.        Other Visit Diagnoses       Routine general medical examination at a health care facility    -  Primary   Vaccines declined. Screening labs checked today. Cologuard at home. Continue diet and exercise. Call with any concerns.   Relevant Orders   Comprehensive metabolic panel with GFR   CBC with Differential/Platelet   Lipid Panel w/o Chol/HDL Ratio   PSA   TSH   Hepatitis B surface antibody,quantitative   Hepatitis C Antibody     Left arm pain       Offered referral to PT and x-rays, would prefer to go see sports medicine. Referral placed today. Call with any concerns.   Relevant Orders   Ambulatory referral to Sports Medicine     Family history of diabetes mellitus       Labs drawn today. Await results.   Relevant Orders   Bayer DCA Hb A1c Waived        LABORATORY TESTING:  Health maintenance labs ordered today as discussed above.   The natural  history of prostate cancer and ongoing controversy regarding screening and potential treatment outcomes of prostate cancer has been discussed with the patient. The meaning of a false positive PSA and a false negative PSA has been discussed. He indicates understanding of the limitations of this screening test and wishes to proceed with screening PSA testing.   IMMUNIZATIONS:   - Tdap: Tetanus vaccination status reviewed: Refused. - Influenza: Refused - Prevnar: Refused - COVID: Refused - HPV: Not applicable - Shingrix vaccine: Refused  SCREENING: - Colonoscopy: cologuard at home  Discussed with patient purpose of the colonoscopy is to detect colon cancer at curable precancerous or early stages   PATIENT COUNSELING:    Sexuality: Discussed sexually transmitted diseases, partner selection, use of condoms, avoidance of unintended pregnancy  and contraceptive alternatives.   Advised to avoid cigarette smoking.  I discussed with the patient that most people either abstain from  alcohol or drink within safe limits (<=14/week and <=4 drinks/occasion for males, <=7/weeks and <= 3 drinks/occasion for females) and that the risk for alcohol disorders and other health effects rises proportionally with the number of drinks per week and how often a drinker exceeds daily limits.  Discussed cessation/primary prevention of drug use and availability of treatment for abuse.   Diet: Encouraged to adjust caloric intake to maintain  or achieve ideal body weight, to reduce intake of dietary saturated fat and total fat, to limit sodium intake by avoiding high sodium foods and not adding table salt, and to maintain adequate dietary potassium and calcium  preferably from fresh fruits, vegetables, and low-fat dairy products.    stressed the importance of regular exercise  Injury prevention: Discussed safety belts, safety helmets, smoke detector, smoking near bedding or upholstery.   Dental health: Discussed  importance of regular tooth brushing, flossing, and dental visits.   Follow up plan: NEXT PREVENTATIVE PHYSICAL DUE IN 1 YEAR. Return in about 3 months (around 10/15/2024) for virtual OK.     [1]  Allergies Allergen Reactions   Penicillins    Ambien  [Zolpidem ] Palpitations   Lisinopril  Rash  [2]  Social History Tobacco Use  Smoking Status Former   Current packs/day: 0.00   Average packs/day: 0.5 packs/day for 17.0 years (8.5 ttl pk-yrs)   Types: Cigarettes   Start date: 03/26/2000   Quit date: 03/26/2017   Years since quitting: 7.3  Smokeless Tobacco Never   "

## 2024-07-17 NOTE — Assessment & Plan Note (Signed)
 Rechecking labs today. Await results. Treat as needed.

## 2024-07-17 NOTE — Assessment & Plan Note (Signed)
 Under good control on current regimen. Continue current regimen. Continue to monitor. Call with any concerns. Refills given. Labs drawn today.

## 2024-07-18 LAB — CBC WITH DIFFERENTIAL/PLATELET
Basophils Absolute: 0.1 x10E3/uL (ref 0.0–0.2)
Basos: 1 %
EOS (ABSOLUTE): 0 x10E3/uL (ref 0.0–0.4)
Eos: 0 %
Hematocrit: 48.2 % (ref 37.5–51.0)
Hemoglobin: 15.6 g/dL (ref 13.0–17.7)
Immature Grans (Abs): 0 x10E3/uL (ref 0.0–0.1)
Immature Granulocytes: 0 %
Lymphocytes Absolute: 2.5 x10E3/uL (ref 0.7–3.1)
Lymphs: 23 %
MCH: 28.8 pg (ref 26.6–33.0)
MCHC: 32.4 g/dL (ref 31.5–35.7)
MCV: 89 fL (ref 79–97)
Monocytes Absolute: 0.6 x10E3/uL (ref 0.1–0.9)
Monocytes: 6 %
Neutrophils Absolute: 7.7 x10E3/uL — ABNORMAL HIGH (ref 1.4–7.0)
Neutrophils: 70 %
Platelets: 273 x10E3/uL (ref 150–450)
RBC: 5.41 x10E6/uL (ref 4.14–5.80)
RDW: 13 % (ref 11.6–15.4)
WBC: 10.9 x10E3/uL — ABNORMAL HIGH (ref 3.4–10.8)

## 2024-07-18 LAB — TSH: TSH: 1.61 u[IU]/mL (ref 0.450–4.500)

## 2024-07-18 LAB — COMPREHENSIVE METABOLIC PANEL WITH GFR
ALT: 13 IU/L (ref 0–44)
AST: 19 IU/L (ref 0–40)
Albumin: 4.9 g/dL (ref 3.8–4.9)
Alkaline Phosphatase: 66 IU/L (ref 47–123)
BUN/Creatinine Ratio: 17 (ref 9–20)
BUN: 14 mg/dL (ref 6–24)
Bilirubin Total: 0.4 mg/dL (ref 0.0–1.2)
CO2: 22 mmol/L (ref 20–29)
Calcium: 9.3 mg/dL (ref 8.7–10.2)
Chloride: 99 mmol/L (ref 96–106)
Creatinine, Ser: 0.81 mg/dL (ref 0.76–1.27)
Globulin, Total: 2.5 g/dL (ref 1.5–4.5)
Glucose: 99 mg/dL (ref 70–99)
Potassium: 4.3 mmol/L (ref 3.5–5.2)
Sodium: 139 mmol/L (ref 134–144)
Total Protein: 7.4 g/dL (ref 6.0–8.5)
eGFR: 106 mL/min/1.73

## 2024-07-18 LAB — LIPID PANEL W/O CHOL/HDL RATIO
Cholesterol, Total: 275 mg/dL — ABNORMAL HIGH (ref 100–199)
HDL: 72 mg/dL
LDL Chol Calc (NIH): 181 mg/dL — ABNORMAL HIGH (ref 0–99)
Triglycerides: 126 mg/dL (ref 0–149)
VLDL Cholesterol Cal: 22 mg/dL (ref 5–40)

## 2024-07-18 LAB — HEPATITIS C ANTIBODY: Hep C Virus Ab: NONREACTIVE

## 2024-07-18 LAB — PSA: Prostate Specific Ag, Serum: 1.8 ng/mL (ref 0.0–4.0)

## 2024-07-18 LAB — HEPATITIS B SURFACE ANTIBODY, QUANTITATIVE: Hepatitis B Surf Ab Quant: <3.5 m[IU]/mL — ABNORMAL LOW

## 2024-07-20 ENCOUNTER — Ambulatory Visit: Payer: Self-pay | Admitting: Family Medicine

## 2024-07-25 ENCOUNTER — Ambulatory Visit (INDEPENDENT_AMBULATORY_CARE_PROVIDER_SITE_OTHER): Admitting: Family Medicine

## 2024-07-25 ENCOUNTER — Encounter: Payer: Self-pay | Admitting: Family Medicine

## 2024-07-25 VITALS — BP 130/64 | HR 104 | Ht 66.5 in | Wt 148.0 lb

## 2024-07-25 DIAGNOSIS — M7502 Adhesive capsulitis of left shoulder: Secondary | ICD-10-CM | POA: Insufficient documentation

## 2024-07-25 MED ORDER — TIZANIDINE HCL 4 MG PO TABS: 4.0000 mg | ORAL_TABLET | Freq: Every evening | ORAL | 1 refills | Status: AC | PRN

## 2024-07-25 MED ORDER — MELOXICAM 15 MG PO TABS: 15.0000 mg | ORAL_TABLET | Freq: Every day | ORAL | 1 refills | Status: AC

## 2024-07-25 NOTE — Assessment & Plan Note (Signed)
" °  Orders:   meloxicam  (MOBIC ) 15 MG tablet; Take 1 tablet (15 mg total) by mouth daily.   tiZANidine  (ZANAFLEX ) 4 MG tablet; Take 1 tablet (4 mg total) by mouth at bedtime as needed for muscle spasms.   Ambulatory referral to Physical Therapy  "

## 2024-07-25 NOTE — Patient Instructions (Addendum)
" °  VISIT SUMMARY: During your visit, we discussed your left shoulder and upper arm pain due to adhesive capsulitis. We have developed a treatment plan to help manage your pain and improve your range of motion.  YOUR PLAN: ADHESIVE CAPSULITIS OF LEFT SHOULDER: You have a condition called adhesive capsulitis, which is causing significant pain and limiting your shoulder movement. -Take meloxicam  15 mg once daily for one month with food. -Attend physical therapy once weekly and perform daily home exercises as directed by the therapist. -Use tizanidine  (Zanaflex ) at night as needed for muscle discomfort, but be aware it may cause drowsiness. -Follow up in six weeks to assess your progress and response to therapy. -Contact the clinic sooner if your pain becomes severe or if you are unable to participate in physical therapy. -If there is insufficient improvement after six weeks, we may consider shoulder hydrodilatation. -Avoid upper body activities that require significant shoulder movement, but continue lower body and core exercises. Perform triceps exercises with your elbows at your sides to minimize shoulder involvement.    Contains text generated by [employer].   "

## 2024-07-25 NOTE — Progress Notes (Signed)
 "    Primary Care / Sports Medicine Office Visit  Patient Information:  Patient ID: Shawn Ware, male DOB: 14-Jun-1971 Age: 54 y.o. MRN: [id number]   Shawn Ware is a pleasant 54 y.o. male presenting with the following:  Chief Complaint  Patient presents with   Arm Pain    Left arm pain in tricep area x 4-5 months. Pain can be sharp with certain movements and gripping. Patient has taken OTC medications for pain but continues to have symptoms. No xray's.    Vitals:   07/25/24 0812  BP: 130/64  Pulse: (!) 104  SpO2: 98%   Vitals:   07/25/24 0812  Weight: 148 lb (67.1 kg)  Height: 5' 6.5 (1.689 m)   Body mass index is 23.53 kg/m.  No results found.   Independent interpretation of notes and tests performed by another provider:   None  Procedures performed:   None  Pertinent History, Exam, Impression, and Recommendations:   Discussed the use of AI scribe software for clinical note transcription with the patient, who gave verbal consent to proceed.  History of Present Illness   Shawn Ware is a 54 year old right-hand dominant male with adhesive capsulitis who presents with progressive left shoulder and upper arm pain with restricted range of motion.  Left Shoulder and Upper Arm Pain: - Gradual onset over the past 6-7 months, initially mild and permitted continued workouts and daily activities - Intensified over the last 2 months, now excruciating with certain movements (reaching overhead or downward) - Stabbing pain localized to the posterior upper arm - No radiation, visual changes, ecchymosis, swelling, numbness, tingling, or weakness - No specific trauma recalled, but possible strain during weightlifting without a spotter several months ago - Previous mild tightness in the region  Functional Impairment: - Significant impact on activities of daily living - Inability to resume workouts - Difficulty with tasks such as washing his back - Compensates by using his  right hand for most activities  Self-Management and Prior Interventions: - Rest, avoidance of aggravating activities, and over-the-counter medications (acetaminophen, topical analgesics, naproxen , diclofenac) - Intermittent use of muscle relaxants (baclofen , cyclobenzaprine ), avoids regular use due to drowsiness - Rest over the past week has provided some improvement in pain - No prior use of meloxicam  - No formal physical therapy, but evaluated by a movement specialist who noted decreased mobility in the left arm - No prior surgeries or injections for the left shoulder  Imaging: - Underwent outside x-rays at [facility]opedic group, reported as normal     Physical Exam  LEFT SHOULDER INSPECTION: No deformity, erythema, or atrophy. Normal shoulder contour. PALPATION: Non-tender at the subacromial space and rhomboids. Subtle tenderness at the left upper trapezius, levator scapula origin, and biceps tendon. No crepitus, warmth, effusion, nodules, or bony abnormalities. RANGE OF MOTION: Forward flexion limited with approximately 30-degree deficit on the left, restricted by pain in the posterior upper arm. Abduction and rotation globally limited compared to contralateral side, consistent with capsular restriction. STRENGTH: Rotator cuff strength 5/5 in all planes. Resisted triceps testing 5/5 strength with pain. NEUROLOGICAL: Sensation intact to light touch in the left upper extremity; no focal motor deficit. SPECIAL TESTS: Positive empty can test on the left. Hawkins test equivocal.  Assessment and Plan    Adhesive capsulitis of left shoulder Subacute to chronic adhesive capsulitis in freezing phase, moderate severity, causing significant pain and functional limitation. No rotator cuff tear or significant weakness. Early intervention with anti-inflammatory medication and physical therapy expected to  accelerate recovery. Hydrodilatation discussed if insufficient improvement after six weeks. Risks of  meloxicam  include gastrointestinal upset; procedural risks include discomfort and need for coordination with physical therapy. Gradual improvement in pain and range of motion anticipated with adherence to therapy. - Prescribed meloxicam  15 mg once daily for one month with refill, to be taken with food. - Referred to physical therapy for left shoulder adhesive capsulitis at local and [place] locations. - Instructed him to attend physical therapy once weekly and perform daily home exercises as directed by therapist. - Prescribed tizanidine  (Zanaflex ) for nocturnal use as needed for muscle discomfort, with counseling regarding potential sedation. - Scheduled follow-up in six weeks to assess progress and response to therapy. - Advised to contact clinic sooner if pain becomes severe or if unable to participate in physical therapy, to consider earlier intervention such as hydrodilatation. - Discussed shoulder hydrodilatation as potential next step if insufficient improvement after six weeks, with coordination between procedure and physical therapy if pursued. - Advised to avoid upper body activities requiring significant shoulder movement, encouraged continuation of lower body and core exercises, and to perform triceps exercises with elbows at sides to minimize shoulder involvement.       Assessment & Plan Adhesive capsulitis of left shoulder  Orders:   meloxicam  (MOBIC ) 15 MG tablet; Take 1 tablet (15 mg total) by mouth daily.   tiZANidine  (ZANAFLEX ) 4 MG tablet; Take 1 tablet (4 mg total) by mouth at bedtime as needed for muscle spasms.   Ambulatory referral to Physical Therapy   No follow-ups on file.     Selinda JINNY Ku, MD, [facility]   Primary Care Sports Medicine Primary Care and Sports Medicine at [facility]   "

## 2024-09-02 ENCOUNTER — Ambulatory Visit: Admitting: Family Medicine

## 2024-10-17 ENCOUNTER — Ambulatory Visit: Admitting: Family Medicine
